# Patient Record
Sex: Male | Born: 1955
Health system: Southern US, Community
[De-identification: ages and names within clinical notes are randomized; demographics above are authoritative.]

## PROBLEM LIST (undated history)

## (undated) DIAGNOSIS — E785 Hyperlipidemia, unspecified: Secondary | ICD-10-CM

## (undated) DIAGNOSIS — F410 Panic disorder [episodic paroxysmal anxiety] without agoraphobia: Secondary | ICD-10-CM

## (undated) DIAGNOSIS — G4700 Insomnia, unspecified: Secondary | ICD-10-CM

## (undated) DIAGNOSIS — F419 Anxiety disorder, unspecified: Secondary | ICD-10-CM

## (undated) DIAGNOSIS — K649 Unspecified hemorrhoids: Secondary | ICD-10-CM

## (undated) DIAGNOSIS — Z7722 Contact with and (suspected) exposure to environmental tobacco smoke (acute) (chronic): Secondary | ICD-10-CM

## (undated) DIAGNOSIS — IMO0001 Reserved for inherently not codable concepts without codable children: Secondary | ICD-10-CM

## (undated) DIAGNOSIS — K5730 Diverticulosis of large intestine without perforation or abscess without bleeding: Secondary | ICD-10-CM

## (undated) DIAGNOSIS — F0280 Dementia in other diseases classified elsewhere without behavioral disturbance: Secondary | ICD-10-CM

## (undated) HISTORY — DX: Hyperlipidemia, unspecified: E78.5

## (undated) HISTORY — DX: Panic disorder (episodic paroxysmal anxiety): F41.0

## (undated) HISTORY — DX: Diverticulosis of large intestine without perforation or abscess without bleeding: K57.30

## (undated) HISTORY — PX: SKIN BIOPSY: SHX1

## (undated) HISTORY — DX: Anxiety disorder, unspecified: F41.9

## (undated) HISTORY — DX: Reserved for inherently not codable concepts without codable children: IMO0001

## (undated) HISTORY — DX: Dementia in other diseases classified elsewhere, unspecified severity, without behavioral disturbance, psychotic disturbance, mood disturbance, and anxiety: F02.80

## (undated) HISTORY — PX: WISDOM TOOTH EXTRACTION: SHX21

## (undated) HISTORY — DX: Insomnia, unspecified: G47.00

## (undated) HISTORY — DX: Unspecified hemorrhoids: K64.9

## (undated) HISTORY — DX: Contact with and (suspected) exposure to environmental tobacco smoke (acute) (chronic): Z77.22

## (undated) HISTORY — PX: ANAL FISSURE REPAIR: SHX2312

---

## 2000-02-10 ENCOUNTER — Ambulatory Visit (HOSPITAL_COMMUNITY): Admission: RE | Admit: 2000-02-10 | Discharge: 2000-02-10 | Payer: Self-pay | Admitting: Surgery

## 2001-09-14 ENCOUNTER — Encounter: Admission: RE | Admit: 2001-09-14 | Discharge: 2001-10-16 | Payer: Self-pay | Admitting: Internal Medicine

## 2006-07-19 ENCOUNTER — Ambulatory Visit: Payer: Self-pay | Admitting: Family Medicine

## 2007-08-02 ENCOUNTER — Ambulatory Visit: Payer: Self-pay | Admitting: Family Medicine

## 2010-11-03 ENCOUNTER — Encounter (INDEPENDENT_AMBULATORY_CARE_PROVIDER_SITE_OTHER): Payer: Self-pay | Admitting: Family Medicine

## 2010-11-03 DIAGNOSIS — F3289 Other specified depressive episodes: Secondary | ICD-10-CM

## 2010-11-03 DIAGNOSIS — E785 Hyperlipidemia, unspecified: Secondary | ICD-10-CM

## 2010-11-03 DIAGNOSIS — F329 Major depressive disorder, single episode, unspecified: Secondary | ICD-10-CM

## 2010-11-03 DIAGNOSIS — F411 Generalized anxiety disorder: Secondary | ICD-10-CM

## 2011-05-09 ENCOUNTER — Telehealth: Payer: Self-pay | Admitting: Family Medicine

## 2011-05-09 MED ORDER — SIMVASTATIN 20 MG PO TABS: 20.0000 mg | ORAL_TABLET | Freq: Every evening | ORAL | Status: DC

## 2011-05-09 NOTE — Telephone Encounter (Signed)
Needs refill simvastatin. Has meds ck appt on 8/22 but will run out before then.   [facility]

## 2011-05-16 ENCOUNTER — Encounter: Payer: Self-pay | Admitting: Family Medicine

## 2011-05-18 ENCOUNTER — Other Ambulatory Visit: Payer: Self-pay | Admitting: Family Medicine

## 2011-05-18 ENCOUNTER — Other Ambulatory Visit: Payer: Self-pay

## 2011-05-18 ENCOUNTER — Encounter: Payer: Self-pay | Admitting: Family Medicine

## 2011-05-18 ENCOUNTER — Ambulatory Visit (INDEPENDENT_AMBULATORY_CARE_PROVIDER_SITE_OTHER): Payer: Self-pay | Admitting: Family Medicine

## 2011-05-18 VITALS — BP 116/70 | HR 51 | Wt 183.0 lb

## 2011-05-18 DIAGNOSIS — E785 Hyperlipidemia, unspecified: Secondary | ICD-10-CM | POA: Insufficient documentation

## 2011-05-18 DIAGNOSIS — F32A Depression, unspecified: Secondary | ICD-10-CM

## 2011-05-18 DIAGNOSIS — F3289 Other specified depressive episodes: Secondary | ICD-10-CM

## 2011-05-18 DIAGNOSIS — F329 Major depressive disorder, single episode, unspecified: Secondary | ICD-10-CM

## 2011-05-18 MED ORDER — SIMVASTATIN 10 MG PO TABS: 10.0000 mg | ORAL_TABLET | Freq: Every day | ORAL | Status: DC

## 2011-05-18 NOTE — Progress Notes (Signed)
  Subjective:    Patient ID: Paul Klein, male    DOB: [birth year], 55 y.o.   MRN: [id number]  HPI He is here for a recheck. He is followed by Dr. Evelene Croon for his depression. He also is on simvastatin as well as aspirin, vitamin D and fish oil. He has had difficulty finding a job and this is quite stressful. He has no other concerns or complaints.   Review of Systems     Objective:   Physical Exam  Alert and in no distress otherwise not examined      Assessment & Plan:  Hyperlipidemia. Depression. Routine blood screening and continue on present medications.

## 2011-05-19 LAB — CLIENT PROFILE 1158
ALT: 29 U/L (ref 0–53)
Albumin: 4.7 g/dL (ref 3.5–5.2)
CO2: 27 mEq/L (ref 19–32)
Calcium: 9.8 mg/dL (ref 8.4–10.5)
Chloride: 103 mEq/L (ref 96–112)
Cholesterol: 157 mg/dL (ref 0–200)
Eosinophils Absolute: 0 10*3/uL (ref 0.0–0.7)
Free Thyroxine Index: 3.1 (ref 1.0–3.9)
Glucose, Bld: 107 mg/dL — ABNORMAL HIGH (ref 70–99)
Hemoglobin: 15.1 g/dL (ref 13.0–17.0)
Lymphocytes Relative: 30 % (ref 12–46)
Lymphs Abs: 1.8 10*3/uL (ref 0.7–4.0)
MCH: 29.2 pg (ref 26.0–34.0)
Monocytes Relative: 6 % (ref 3–12)
Neutrophils Relative %: 63 % (ref 43–77)
Potassium: 4.8 mEq/L (ref 3.5–5.3)
RBC: 5.17 MIL/uL (ref 4.22–5.81)
Sodium: 138 mEq/L (ref 135–145)
T3 Uptake: 29.8 % (ref 22.5–37.0)
TSH: 1.123 u[IU]/mL (ref 0.350–4.500)
Total Bilirubin: 0.7 mg/dL (ref 0.3–1.2)
Total Protein: 6.9 g/dL (ref 6.0–8.3)
Triglycerides: 115 mg/dL (ref <150)
WBC: 5.8 10*3/uL (ref 4.0–10.5)

## 2011-05-26 ENCOUNTER — Telehealth: Payer: Self-pay | Admitting: Family Medicine

## 2011-05-26 NOTE — Telephone Encounter (Signed)
Please call pt never heard from labs from February visit

## 2011-05-26 NOTE — Telephone Encounter (Signed)
Correction, pt calling for lab results from 05/18/11

## 2011-08-22 ENCOUNTER — Encounter: Payer: Self-pay | Admitting: Family Medicine

## 2011-12-13 ENCOUNTER — Other Ambulatory Visit: Payer: Self-pay | Admitting: Family Medicine

## 2012-01-11 ENCOUNTER — Other Ambulatory Visit: Payer: Self-pay | Admitting: Family Medicine

## 2012-02-08 ENCOUNTER — Other Ambulatory Visit: Payer: Self-pay | Admitting: Family Medicine

## 2012-12-25 ENCOUNTER — Ambulatory Visit (INDEPENDENT_AMBULATORY_CARE_PROVIDER_SITE_OTHER): Payer: BC Managed Care – PPO | Admitting: Medical

## 2012-12-25 ENCOUNTER — Encounter: Payer: Self-pay | Admitting: Medical

## 2012-12-25 VITALS — BP 110/70 | HR 52 | Temp 97.7°F | Resp 16 | Ht 72.0 in | Wt 170.0 lb

## 2012-12-25 DIAGNOSIS — E785 Hyperlipidemia, unspecified: Secondary | ICD-10-CM

## 2012-12-25 DIAGNOSIS — IMO0001 Reserved for inherently not codable concepts without codable children: Secondary | ICD-10-CM

## 2012-12-25 DIAGNOSIS — Z1211 Encounter for screening for malignant neoplasm of colon: Secondary | ICD-10-CM

## 2012-12-25 DIAGNOSIS — Z0000 Encounter for general adult medical examination without abnormal findings: Secondary | ICD-10-CM

## 2012-12-25 DIAGNOSIS — R9431 Abnormal electrocardiogram [ECG] [EKG]: Secondary | ICD-10-CM

## 2012-12-25 DIAGNOSIS — I498 Other specified cardiac arrhythmias: Secondary | ICD-10-CM

## 2012-12-25 DIAGNOSIS — R7301 Impaired fasting glucose: Secondary | ICD-10-CM

## 2012-12-25 DIAGNOSIS — R001 Bradycardia, unspecified: Secondary | ICD-10-CM

## 2012-12-25 DIAGNOSIS — Q849 Congenital malformation of integument, unspecified: Secondary | ICD-10-CM

## 2012-12-25 DIAGNOSIS — Q829 Congenital malformation of skin, unspecified: Secondary | ICD-10-CM

## 2012-12-25 HISTORY — DX: Reserved for inherently not codable concepts without codable children: IMO0001

## 2012-12-25 LAB — CBC WITH DIFFERENTIAL/PLATELET
Eosinophils Absolute: 0.1 10*3/uL (ref 0.0–0.7)
Eosinophils Relative: 2 % (ref 0–5)
HCT: 39.1 % (ref 39.0–52.0)
Hemoglobin: 13.2 g/dL (ref 13.0–17.0)
Lymphs Abs: 1.5 10*3/uL (ref 0.7–4.0)
MCH: 28.1 pg (ref 26.0–34.0)
MCV: 83.4 fL (ref 78.0–100.0)
Monocytes Absolute: 0.5 10*3/uL (ref 0.1–1.0)
Monocytes Relative: 10 % (ref 3–12)
Platelets: 183 10*3/uL (ref 150–400)
RBC: 4.69 MIL/uL (ref 4.22–5.81)

## 2012-12-25 LAB — LIPID PANEL
LDL Cholesterol: 70 mg/dL (ref 0–99)
Total CHOL/HDL Ratio: 2.9 Ratio
VLDL: 18 mg/dL (ref 0–40)

## 2012-12-25 LAB — POCT URINALYSIS DIPSTICK
Protein, UA: NEGATIVE
Spec Grav, UA: <=1.005
Urobilinogen, UA: NEGATIVE

## 2012-12-25 LAB — COMPREHENSIVE METABOLIC PANEL
Alkaline Phosphatase: 55 U/L (ref 39–117)
Glucose, Bld: 103 mg/dL — ABNORMAL HIGH (ref 70–99)
Sodium: 138 mEq/L (ref 135–145)
Total Bilirubin: 0.5 mg/dL (ref 0.3–1.2)
Total Protein: 6.5 g/dL (ref 6.0–8.3)

## 2012-12-25 NOTE — Patient Instructions (Signed)
Recommendations today:  Referral to Dr. Jorja Loa for skin surveillance and check on the left temple and right upper quadrant abdominal lesions  Referral for 1st screening colonoscopy consult  We will call back with possible consideration of cardiology referral for baseline cardiac testing  Consider pneumococcal vaccine given your age and tobacco smoke exposure  Eat a healthy low fat diet, try and get some exercise daily such as walking  See dentist 2 times per year for hygiene  See eye doctor yearly for routine screening   Get Influenza vaccine yearly in October  Continue your medications as usual  We will call with lab results

## 2012-12-25 NOTE — Progress Notes (Addendum)
Subjective:   HPI  Paul Klein is a 57 y.o. male who presents for a complete physical.  Doing well with no specific c/o.   Preventative care: Last ophthalmology visit:n/a Last dental visit:yes-Dr. Hail Last colonoscopy:never Last prostate exam:  Last KGM:WNUUVO Last labs: 2012  Prior vaccinations: TD or Tdap:maybe 7 years ago Influenza:07/2012 Pneumococcal:n/a Shingles/Zostavax:n/A  Advanced directive:n/a Health care power of attorney:n/a Living will:n/a  Concerns: None specifically.  Compliant with medication for cholesterol.  Reviewed their medical, surgical, family, social, medication, and allergy history and updated chart as appropriate.   Past Medical History  Diagnosis Date  . Dyslipidemia   . Anxiety     Dr. Evelene Croon  . Panic attack   . Insomnia     uses Neurontin ( Dr. Evelene Croon)  . Second hand smoke exposure     wife    Past Surgical History  Procedure Laterality Date  . Anal fissure repair    . Wisdom tooth extraction    . Skin biopsy      face, back  . Colonoscopy      never as of 12/2012    Family History  Problem Relation Age of Onset  . Heart disease Mother     BYPASS HEART SURGERY  . Depression Mother   . Mental illness Mother   . Cancer Mother     skin  . Stroke Maternal Grandmother   . Hypertension Father   . Benign prostatic hyperplasia Father   . Cancer Father     skin  . Diabetes Paternal Grandfather     History   Social History  . Marital Status: Married    Spouse Name: N/A    Number of Children: N/A  . Years of Education: N/A   Occupational History  . Not on file.   Social History Main Topics  . Smoking status: Passive Smoke Exposure - Never Smoker  . Smokeless tobacco: Never Used  . Alcohol Use: No  . Drug Use: No     Comment: marijuana in the past  . Sexually Active: Not on file   Other Topics Concern  . 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 aspirin 81 MG tablet Take 81 mg by mouth daily.        . cholecalciferol (VITAMIN D) 1000 UNITS tablet Take 1,000 Units by mouth daily.        . fish oil-omega-3 fatty acids 1000 MG capsule Take 2 g by mouth daily.        [name] gabapentin (NEURONTIN) 800 MG tablet Take 800 mg by mouth 3 (three) times daily.        [name] LORazepam (ATIVAN) 2 MG tablet Take 4 mg by mouth daily.        . simvastatin (ZOCOR) 10 MG tablet TAKE 1 TABLET BY MOUTH DAILY AT BEDTIME  30 tablet  2   No current facility-administered medications on file prior to visit.    Allergies  Allergen Reactions  . Prozac (Fluoxetine Hcl)     SSRIs in general cause worsened depression and suicidal ideation     Review of Systems Constitutional: -fever, -chills, -sweats, -unexpected weight change, -decreased appetite, -fatigue Allergy: -sneezing, -itching, -congestion Dermatology: -changing moles, --rash, -lumps ENT: -runny nose, -ear pain, -sore throat, -hoarseness, -sinus pain, -teeth pain, - ringing in ears, -  hearing loss, -nosebleeds Cardiology: -chest pain, -palpitations, -swelling, -difficulty breathing when lying flat, -waking up short of breath Respiratory: +cough, -shortness of breath, -difficulty breathing with exercise or exertion, -wheezing, -coughing up blood Gastroenterology: -abdominal pain, -nausea, -vomiting, -diarrhea, -constipation, -blood in stool, -changes in bowel movement, -difficulty swallowing or eating Hematology: +bleeding, -bruising  Musculoskeletal: +joint aches, -muscle aches, -joint swelling, +back pain, +neck pain, -cramping, -changes in gait Ophthalmology: denies vision changes, eye redness, itching, discharge Urology: -burning with urination, -difficulty urinating, -blood in urine, -urinary frequency, -urgency, -incontinence Neurology: -headache, -weakness, +tingling, -numbness, -memory loss, -falls,  -dizziness Psychology: -depressed mood, -agitation, -sleep problems     Objective:   Physical Exam  Vitals and nurse notes reviewed  General appearance: alert, no distress, WD/WN, lean white male Skin:  RUQ of abdomen with 3mm roundish lesion, 2 different shades of brown, somewhat ill defined border, left temple with 5mm squarish pink/red, broad lesion with slight raised appearance, rough texture, several seborrheic keratoses on back and flanks, other small brown round benign appearing lesions throughout HEENT: normocephalic, conjunctiva/corneas normal, sclerae anicteric, PERRLA, EOMi, nares patent, no discharge or erythema, pharynx normal Oral cavity: MMM, tongue normal, teeth in good repair Neck: supple, no lymphadenopathy, no thyromegaly, no masses, normal ROM, no bruits Chest: non tender, normal shape and expansion Heart: RRR, normal S1, S2, no murmurs Lungs: CTA bilaterally, no wheezes, rhonchi, or rales Abdomen: +bs, soft, non tender, non distended, no masses, no hepatomegaly, no splenomegaly, no bruits Back: non tender, normal ROM, no scoliosis Musculoskeletal: upper extremities non tender, no obvious deformity, normal ROM throughout, lower extremities non tender, no obvious deformity, normal ROM throughout Extremities: no edema, no cyanosis, no clubbing Pulses: 2+ symmetric, upper and lower extremities, normal cap refill Neurological: alert, oriented x 3, CN2-12 intact, strength normal upper extremities and lower extremities, sensation normal throughout, DTRs 2+ throughout, no cerebellar signs, gait normal Psychiatric: normal affect, behavior normal, pleasant  GU: normal male external genitalia, circumcised, small reducible inguinal hernia on the right, nontender, no masses, no lymphadenopathy Rectal: anus normal appearing, normal tone, prostate mildly enlarged, no nodules, occult negative stool   Adult ECG Report  Indication: physical  Rate: 48 bpm  Rhythm: sinus bradycardia   QRS Axis: 82 degrees  PR Interval: 162 ms  QRS Duration: 94ms  QTc:  Conduction Disturbances: none  Other Abnormalities: early repolarization vs questionable ST elevation II, III, AVF  Patient's cardiac risk factors are: advanced age (older than 38 for men, 66 for women), dyslipidemia, male gender and smoking/ tobacco exposure.  EKG comparison: none  Narrative Interpretation: sinus bradycardia, early repolarization     Assessment and Plan :    Encounter Diagnoses  Name Primary?  . Routine general medical examination at a health care facility Yes  . Dyslipidemia   . Special screening for malignant neoplasms, colon   . Nonspecific abnormal electrocardiogram (ECG) (EKG)   . Bradycardia   . Skin anomaly   . Impaired fasting glucose     Physical exam - discussed healthy lifestyle, diet, exercise, preventative care, vaccinations, and addressed their concerns.  Advised yearly eye doctor visit, twice yearly dental visits, yearly physical here, increased exercise, healthy diet.     Dyslipidemia - c/t same medications, labs today  Referral for 1st colonoscopy  Abnormal EKG, bradycardia on screening physical - pending labs, EKG review with Dr. Susann Givens, consider referral to cardiology for baseline screening.  He does have significant secondhand smoke exposure  Skin anaomly - referral to Dr. Jorja Loa for  routine surveillance and the 2 abnormal skin lesions (left temple, RUQ abdomen)  Impaired fasting glucose - labs today  Follow-up pending labs, referrals

## 2012-12-26 ENCOUNTER — Other Ambulatory Visit: Payer: Self-pay | Admitting: Medical

## 2012-12-26 LAB — VITAMIN D 25 HYDROXY (VIT D DEFICIENCY, FRACTURES): Vit D, 25-Hydroxy: 39 ng/mL (ref 30–89)

## 2012-12-26 LAB — HIGH SENSITIVITY CRP: CRP, High Sensitivity: 15.9 mg/L — ABNORMAL HIGH

## 2012-12-26 LAB — HEMOGLOBIN A1C: Mean Plasma Glucose: 128 mg/dL — ABNORMAL HIGH (ref <117)

## 2012-12-26 MED ORDER — SIMVASTATIN 10 MG PO TABS: 10.0000 mg | ORAL_TABLET | Freq: Every day | ORAL | Status: DC

## 2013-01-01 ENCOUNTER — Telehealth: Payer: Self-pay | Admitting: Family Medicine

## 2013-01-01 NOTE — Telephone Encounter (Signed)
Patient is aware of his appointment at Dr. Elnoria Howard office on 12/31/12 @ 915 am. CLS

## 2013-01-03 ENCOUNTER — Encounter: Payer: Self-pay | Admitting: Cardiology

## 2013-01-03 NOTE — Progress Notes (Signed)
Patient ID: Paul Klein, male   DOB: 1956-09-25, 57 y.o.   MRN: [id number] Paul Klein, Paul Klein    Date of visit:  01/03/2013 DOB:  15-Oct-1955    Age:  56 yrs. Medical record number:  [id number] Account number:  [id number] Primary Care Provider: Sharlot Gowda C ____________________________ CURRENT DIAGNOSES  1. Hyperlipidemia  2. Essential hypertension  3. Abnormal EKG ____________________________ ALLERGIES  Prozac ____________________________ MEDICATIONS  1. aspirin 81 mg tablet,chewable, 1 p.o. daily  2. Vitamin D3 1,000 unit tablet, 1 p.o. daily  3. simvastatin 10 mg tablet, 1 p.o. daily  4. Fish Oil 1,000 mg capsule, 2 qd  5. gabapentin 800 mg tablet, TID  6. lorazepam 1 mg tablet, 1/2 bid  1 hs  7. multivitamin tablet, 1 p.o. daily  8. Zyrtec 10 mg tablet, 1 p.o. daily ____________________________ CHIEF COMPLAINTS  Told  pulse irrg ____________________________ HISTORY OF PRESENT ILLNESS  Patient seen for cardiac consultation for abnormal EKG. The patient has previously been in good health except for history of some anxiety and depression for which he sees a psychiatrist. He has been on medicine for lipid lowering for several years. He was seen recently for a physical examination and was noted to have an elevated CRP as well as significant sinus bradycardia. 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 He denies angina and has no PND, orthopnea, syncope, palpitations, or claudication. ____________________________ PAST HISTORY  Past Medical Illnesses:  hypertension, anxiety;  Cardiovascular Illnesses:  no previous history of cardiac disease.;  Surgical Procedures:  anal fissure;  Cardiology Procedures-Invasive:  no history of prior cardiac procedures;  Cardiology Procedures-Noninvasive:  no previous non-invasive procedures;  LVEF not documented,   ____________________________ CARDIO-PULMONARY TEST DATES EKG Date:  01/03/2013;    ____________________________ FAMILY HISTORY Father - age 57,  alive and well and  hypertension; Mother - CABG and mental illness; Brother 1 - age 16,  alive and well; Sister 1 - age 90,  alive and well; Sister 2 - age 55,  alive and well;  ____________________________ SOCIAL HISTORY Alcohol Use:  no alcohol use;  Smoking:  used to smoke but quit Prior to 1980;  Diet:  regular diet;  Lifestyle:  divorced and remarried;  Exercise:  no regular exercise;  Occupation:  produce Scientist, physiological and former Education administrator;  Residence:  lives with wife;   ____________________________ REVIEW OF SYSTEMS General:  weight loss of approximately 15 lbs  Integumentary:no rashes or new skin lesions. Eyes: denies diplopia, history of glaucoma or visual problems. Ears, Nose, Throat, Mouth:  denies any hearing loss, epistaxis, hoarseness or difficulty speaking. Respiratory: denies dyspnea, cough, wheezing or hemoptysis. Cardiovascular:  please review HPI Abdominal: denies dyspepsia, GI bleeding, constipation, or diarrhea Genitourinary-Male: no dysuria, urgency, frequency, or nocturia  Musculoskeletal:  recent foot injury Neurological:  denies headaches, stroke, or TIA Psychiatric:  chronic depression, chronic anxiety, insomnia  ____________________________ PHYSICAL EXAMINATION VITAL SIGNS  Blood Pressure:  104/60 Sitting, Left arm, regular cuff  , 88/58 Standing, Left arm and regular cuff   Pulse:  60/min. Weight:  168.00 lbs. Height:  72"BMI: 23  Constitutional:  pleasant white male in no acute distress Skin:  warm and dry to touch, no apparent skin lesions, or masses noted. Head:  normocephalic, balding male hair pattern Eyes:  EOMS Intact, PERRLA, C and S clear, Funduscopic exam not done. ENT:  ears, nose and throat reveal no gross abnormalities.  Dentition good. Neck:  supple, without  massess. No JVD, thyromegaly or carotid bruits. Carotid upstroke normal. Chest:  normal symmetry, clear to auscultation and  percussion. Cardiac:  regular rhythm, normal S1 and S2, No S3 or S4, no murmurs, gallops or rubs detected. Abdomen:  abdomen soft,non-tender, no masses, no hepatospenomegaly, or aneurysm noted Peripheral Pulses:  the femoral,dorsalis pedis, and posterior tibial pulses are full and equal bilaterally with no bruits auscultated. Extremities & Back:  no deformities, clubbing, cyanosis, erythema or edema observed. Normal muscle strength and tone. Neurological:  no gross motor or sensory deficits noted, affect appropriate, oriented x3. ____________________________ MOST RECENT LIPID PANEL 12/26/12  CHOL TOTL 135 mg/dl, LDL 70 calc, HDL 47 mg/dl, TRIGLYCER 92 mg/dl and CHOL/HDL 2.9 (Calc) ____________________________ IMPRESSIONS/PLAN  1. Sinus bradycardia with otherwise normal EKG 2. Elevated CRP with several cardiovascular risk factors  Recommendations:  Obtain treadmill test to evaluate exercise capacity. ____________________________ Christianne Dolin  1. treadmill:  Regular TM At [facility]  2. 12 Lead EKG: Today                       ____________________________ Cardiology Physician:  Darden Palmer MD [facility]

## 2013-02-19 ENCOUNTER — Encounter: Payer: Self-pay | Admitting: Internal Medicine

## 2013-02-19 HISTORY — PX: COLONOSCOPY: SHX174

## 2013-02-19 LAB — HM COLONOSCOPY

## 2013-08-01 ENCOUNTER — Other Ambulatory Visit: Payer: Self-pay

## 2014-02-06 ENCOUNTER — Other Ambulatory Visit: Payer: Self-pay | Admitting: Medical

## 2014-02-06 NOTE — Telephone Encounter (Signed)
Needs office visit.

## 2014-04-03 ENCOUNTER — Ambulatory Visit (INDEPENDENT_AMBULATORY_CARE_PROVIDER_SITE_OTHER): Payer: BC Managed Care – PPO | Admitting: Medical

## 2014-04-03 ENCOUNTER — Encounter: Payer: Self-pay | Admitting: Medical

## 2014-04-03 VITALS — BP 114/70 | HR 60 | Temp 97.9°F | Resp 17 | Wt 162.0 lb

## 2014-04-03 DIAGNOSIS — L0501 Pilonidal cyst with abscess: Secondary | ICD-10-CM

## 2014-04-03 MED ORDER — DOXYCYCLINE HYCLATE 100 MG PO TABS: 100.0000 mg | ORAL_TABLET | Freq: Two times a day (BID) | ORAL | Status: DC

## 2014-04-03 MED ORDER — HYDROCODONE-ACETAMINOPHEN 7.5-325 MG PO TABS: 1.0000 | ORAL_TABLET | Freq: Four times a day (QID) | ORAL | Status: DC | PRN

## 2014-04-03 NOTE — Patient Instructions (Signed)
  Thank you for giving me the opportunity to serve you today.    Your diagnosis today includes: Encounter Diagnosis  Name Primary?  . Pilonidal abscess Yes     Specific recommendations today include:  Begin Doxycyline antibiotic twice daily   You may use the Hydrocodone pain medication as needed, 1 tablet every 4-6 hours  Over the weekend use Epsom salt hot water bath soaks  Keep gauze dressing over the area as it will drain some pus  Recheck here Monday  Pilonidal Cyst A pilonidal cyst occurs when hairs get trapped (ingrown) beneath the skin in the crease between the buttocks over your sacrum (the bone under that crease). Pilonidal cysts are most common in young men with a lot of body hair. When the cyst is ruptured (breaks) or leaking, fluid from the cyst may cause burning and itching. If the cyst becomes infected, it causes a painful swelling filled with pus (abscess). The pus and trapped hairs need to be removed (often by lancing) so that the infection can heal. However, recurrence is common and an operation may be needed to remove the cyst. HOME CARE INSTRUCTIONS   If the cyst was NOT INFECTED:  Keep the area clean and dry. Bathe or shower daily. Wash the area well with a germ-killing soap. Warm tub baths may help prevent infection and help with drainage. Dry the area well with a towel.  Avoid tight clothing to keep area as moisture free as possible.  Keep area between buttocks as free of hair as possible. A depilatory may be used.  If the cyst WAS INFECTED and needed to be drained:  Your caregiver packed the wound with gauze to keep the wound open. This allows the wound to heal from the inside outwards and continue draining.  Return for a wound check in 1 day or as suggested.  If you take tub baths or showers, repack the wound with gauze following them. Sponge baths (at the sink) are a good alternative.  If an antibiotic was ordered to fight the infection, take as  directed.  Only take over-the-counter or prescription medicines for pain, discomfort, or fever as directed by your caregiver.  After the drain is removed, use sitz baths for 20 minutes 4 times per day. Clean the wound gently with mild unscented soap, pat dry, and then apply a dry dressing. SEEK MEDICAL CARE IF:   You have increased pain, swelling, redness, drainage, or bleeding from the area.  You have a fever.  You have muscles aches, dizziness, or a general ill feeling. Document Released: 09/09/2000 Document Revised: 12/05/2011 Document Reviewed: 11/07/2008 [facility] Patient Information 2015 [place], [place]. This information is not intended to replace advice given to you by your health care provider. Make sure you discuss any questions you have with your health care provider.

## 2014-04-03 NOTE — Addendum Note (Signed)
Addended by: Carlena Hurl on: 04/03/2014 03:44 PM   Modules accepted: Orders

## 2014-04-03 NOTE — Progress Notes (Signed)
   Subjective:    Patient ID: Paul Klein, male    DOB: [birth year], 58 y.o.   MRN: [id number]  HPI  Patient is presenting for a 5 day history of a growth over left buttock midline/top of butt crack. Patient states that he first noticed it on Saturday when he sat down, it felt painful. He reports that the growth is red and hurts to sit. He did notice on Sunday some blood on the lower side of his shirt where the growth is. He has tried soaking in a tub with warm water would help but did not notice a difference afterward.  Denies fevers, n/v, diarrhea, constipation, frequent skin infections, urinary changes. He has had a sebaceous cyst removed from his back before with PA Maudie Mercury here at [place], but no prior pilonidal abscess, no prior other skin abscess, no prior MRSA.   No other aggravating or relieving factors. No other complaint  Review of Systems As in subjective.    Objective:   Physical Exam  BP 114/70  Pulse 60  Temp(Src) 97.9 F (36.6 C) (Oral)  Resp 17  Wt 162 lb (73.483 kg)  General: alert and cooperative, wd/wn, no acute distress Skin: Left buttock adjacent to the top of the gluteal cleft with area of induration and erythema ~3cm in diameter. Lesion is tender but without obvious fluctuance or drainage.      Assessment & Plan:   Encounter Diagnosis  Name Primary?  . Pilonidal abscess Yes   We discussed the findings, discussed the likelihood of an abscess, but not clear if this is an extensive Pilonidal tracking formation.  Doesn't appear so initially.  We discussed treatment options and he agrees to I and D.  Discussed risks/benefits of procedure, and patient gave consent for I&D of the left upper buttock at the top of the gluteal cleft.  Prepped skin in usual sterile fashion.  Used 2.5 cc of 1% lidocaine with epinephrine for local anesthesia, used #11 blade to incise the indurated area, expressed  3 cc of purulent material.  Explored when the hemostats and the  area appears to be a small pocket.  Irrigated area with high pressure saline.  Didn't place packing given the size of the wound and given the fact that today is Thursday and will not be able to remove the packing in 3 day.  Covered wound with sterile bandage and dressing.  EBL 1cc.  Patient tolerated procedure well.    Begin doxycycline antibiotic, hydrocodone as needed for pain, advised Epsom salt warm bath soaks for 20 minutes at a time, numerous times over the weekend. Recheck here on Monday

## 2014-04-06 LAB — WOUND CULTURE
GRAM STAIN: NONE SEEN
GRAM STAIN: NONE SEEN
ORGANISM ID, BACTERIA: NO GROWTH

## 2014-04-07 ENCOUNTER — Ambulatory Visit (INDEPENDENT_AMBULATORY_CARE_PROVIDER_SITE_OTHER): Payer: BC Managed Care – PPO | Admitting: Medical

## 2014-04-07 ENCOUNTER — Encounter: Payer: Self-pay | Admitting: Medical

## 2014-04-07 VITALS — BP 112/62 | HR 80 | Resp 14 | Wt 165.0 lb

## 2014-04-07 DIAGNOSIS — L0501 Pilonidal cyst with abscess: Secondary | ICD-10-CM

## 2014-04-07 NOTE — Progress Notes (Signed)
   Subjective:    Patient ID: Paul Klein, male    DOB: Jun 05, 1956, 58 y.o.   MRN: [id number]  HPI  Patient is presenting for recheck on pilonidal abscess.   We saw him here Friday, had I&D, taking antibiotic without fever or other symptoms.   Here for wound check,  Improved.    Review of Systems As in subjective.    Objective:   Physical Exam  BP 112/62  Pulse 80  Resp 14  Wt 165 lb (74.844 kg)  General: alert and cooperative, wd/wn, no acute distress Skin: Left buttock adjacent to the top of the gluteal cleft with area of pink/red coloration ~ 1 cm diameter now, slightly tender, no current drainage     Assessment & Plan:   Encounter Diagnosis  Name Primary?  . Pilonidal abscess Yes   Much improved, finish antibiotic, c/t warm compresses.  If not back to normal within a week, let me know.

## 2014-07-11 ENCOUNTER — Other Ambulatory Visit: Payer: Self-pay | Admitting: Medical

## 2014-07-11 ENCOUNTER — Telehealth: Payer: Self-pay | Admitting: *Deleted

## 2014-07-11 NOTE — Telephone Encounter (Signed)
Called patient and let him know I could only give him #30 on his zocor this one time. His last CPE and lipid panel were 12/2012. Asked him to please call back to schedule either fasting med check or fasting CPE in the next 30 days.

## 2014-07-31 ENCOUNTER — Ambulatory Visit (INDEPENDENT_AMBULATORY_CARE_PROVIDER_SITE_OTHER): Payer: BC Managed Care – PPO | Admitting: Medical

## 2014-07-31 ENCOUNTER — Encounter: Payer: Self-pay | Admitting: Medical

## 2014-07-31 VITALS — BP 106/60 | HR 58 | Temp 97.9°F | Resp 15 | Ht 72.0 in | Wt 163.0 lb

## 2014-07-31 DIAGNOSIS — Z808 Family history of malignant neoplasm of other organs or systems: Secondary | ICD-10-CM

## 2014-07-31 DIAGNOSIS — D229 Melanocytic nevi, unspecified: Secondary | ICD-10-CM

## 2014-07-31 DIAGNOSIS — Z125 Encounter for screening for malignant neoplasm of prostate: Secondary | ICD-10-CM

## 2014-07-31 DIAGNOSIS — Z7185 Encounter for immunization safety counseling: Secondary | ICD-10-CM

## 2014-07-31 DIAGNOSIS — Z135 Encounter for screening for eye and ear disorders: Secondary | ICD-10-CM

## 2014-07-31 DIAGNOSIS — Z7189 Other specified counseling: Secondary | ICD-10-CM

## 2014-07-31 DIAGNOSIS — R7301 Impaired fasting glucose: Secondary | ICD-10-CM

## 2014-07-31 DIAGNOSIS — R7982 Elevated C-reactive protein (CRP): Secondary | ICD-10-CM

## 2014-07-31 DIAGNOSIS — Z7722 Contact with and (suspected) exposure to environmental tobacco smoke (acute) (chronic): Secondary | ICD-10-CM

## 2014-07-31 DIAGNOSIS — Z0000 Encounter for general adult medical examination without abnormal findings: Secondary | ICD-10-CM

## 2014-07-31 DIAGNOSIS — Z23 Encounter for immunization: Secondary | ICD-10-CM

## 2014-07-31 DIAGNOSIS — E785 Hyperlipidemia, unspecified: Secondary | ICD-10-CM

## 2014-07-31 LAB — POCT URINALYSIS DIPSTICK
Bilirubin, UA: NEGATIVE
Blood, UA: NEGATIVE
Glucose, UA: NEGATIVE
Ketones, UA: NEGATIVE
Leukocytes, UA: NEGATIVE
NITRITE UA: NEGATIVE
PH UA: 7
PROTEIN UA: NEGATIVE
Spec Grav, UA: 1.01
Urobilinogen, UA: NEGATIVE

## 2014-07-31 NOTE — Progress Notes (Signed)
Subjective:   HPI  Paul Klein is a 58 y.o. male who presents for a complete physical.   Preventative care: Last ophthalmology visit:20 years ago  Last dental visit:3 years  Last colonoscopy:2 years  Last prostate exam: yearly Last EKG:2 years ago  Last labs:yearly  Prior vaccinations: TD or Tdap:unsure Influenza:today Pneumococcal:no Shingles/Zostavax:no  Advanced directive:no Health care power of attorney:no Living will:yes  Concerns: none  Dyslipidemia - compliant with medication without c/o.  Reviewed their medical, surgical, family, social, medication, and allergy history and updated chart as appropriate.  Past Medical History  Diagnosis Date  . Dyslipidemia   . Anxiety     Dr. Toy Care  . Panic attack   . Insomnia     uses Neurontin ( Dr. Toy Care)  . Second hand smoke exposure     wife  . Hemorrhoids     internal and externa per 2014 colonoscopy  . Diverticulosis of colon     sigmoid, per colonoscopy 2014, Dr. Benson Norway  . Normal cardiac stress test 12/2012    performed due to risk factors, abnormal EKG; Dr. Wynonia Lawman    Past Surgical History  Procedure Laterality Date  . Anal fissure repair    . Wisdom tooth extraction    . Skin biopsy      face, back  . Colonoscopy  02/19/13    diverticulosis of sigmoid colon, external and internal hemorrhoids, Dr. Benson Norway, repeat in 10 years    History   Social History  . Marital Status: Married    Spouse Name: N/A    Number of Children: N/A  . Years of Education: N/A   Occupational History  . Not on file.   Social History Main Topics  . Smoking status: Passive Smoke Exposure - Never Smoker  . Smokeless tobacco: Never Used  . Alcohol Use: No  . Drug Use: No     Comment: marijuana in the past  . Sexual Activity: Not on file   Other Topics Concern  . 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 Heart disease Mother     BYPASS HEART SURGERY  . Depression Mother   . Mental illness Mother   . Cancer Mother     skin  . Stroke Maternal Grandmother   . Hypertension Father   . Benign prostatic hyperplasia Father   . Cancer Father     skin  . Diabetes Paternal Grandfather     Current outpatient prescriptions: aspirin 81 MG tablet, Take 81 mg by mouth daily.  , Disp: , Rfl: ;  fish oil-omega-3 fatty acids 1000 MG capsule, Take 2 g by mouth daily.  , Disp: , Rfl: ;  gabapentin (NEURONTIN) 800 MG tablet, Take 800 mg by mouth 3 (three) times daily.  , Disp: , Rfl: ;  LORazepam (ATIVAN) 2 MG tablet, Take 4 mg by mouth daily.  , Disp: , Rfl:  nicotine polacrilex (NICORETTE) 4 MG gum, Take 4 mg by mouth as needed for smoking cessation., Disp: , Rfl: ;  simvastatin (ZOCOR) 10 MG tablet, TAKE 1 TABLET BY MOUTH DAILY AT BEDTIME, Disp: 30 tablet, Rfl: 0;  cholecalciferol (VITAMIN D) 1000 UNITS tablet, Take 1,000 Units by mouth daily.  , Disp: , Rfl:   Allergies  Allergen Reactions  . Prozac [Fluoxetine Hcl]  SSRIs in general cause worsened depression and suicidal ideation     Review of Systems Constitutional: -fever, -chills, -sweats, -unexpected weight change, -decreased appetite, -fatigue Allergy: -sneezing, -itching, -congestion Dermatology: -changing moles, --rash, -lumps ENT: -runny nose, +ear pain, -sore throat, -hoarseness, -sinus pain, -teeth pain, - ringing in ears, -hearing loss, -nosebleeds Cardiology: -chest pain, -palpitations, -swelling, -difficulty breathing when lying flat, -waking up short of breath Respiratory: -cough, -shortness of breath, -difficulty breathing with exercise or exertion, -wheezing, -coughing up blood Gastroenterology: -abdominal pain, -nausea, -vomiting, -diarrhea, -constipation, -blood in stool, -changes in bowel movement, -difficulty swallowing or eating Hematology: -bleeding, -bruising   Musculoskeletal: +joint aches, +muscle aches, -joint swelling, -back pain, -neck pain, -cramping, -changes in gait Ophthalmology: denies vision changes, eye redness, itching, discharge Urology: -burning with urination, -difficulty urinating, -blood in urine, -urinary frequency, -urgency, -incontinence Neurology: -headache, -weakness, -tingling, -numbness, -memory loss, -falls, -dizziness Psychology: -depressed mood, -agitation, -sleep problems     Objective:   Physical Exam  BP 106/60 mmHg  Pulse 58  Temp(Src) 97.9 F (36.6 C) (Oral)  Resp 15  Ht 6' (1.829 m)  Wt 163 lb (73.936 kg)  BMI 22.10 kg/m2  General appearance: alert, no distress, WD/WN, lean white male Skin: RUQ of abdomen with 58mm roundish lesion, 2 different shades of brown, somewhat ill defined border, left temple with 61mm squarish pink/red, broad lesion with slight raised appearance, rough texture, several seborrheic keratoses on back and flanks, other small brown round benign appearing lesions throughout HEENT: normocephalic, conjunctiva/corneas normal, sclerae anicteric, PERRLA, EOMi, nares patent, no discharge or erythema, pharynx normal Oral cavity: MMM, tongue normal, teeth in good repair Neck: supple, no lymphadenopathy, no thyromegaly, no masses, normal ROM, no bruits Chest: non tender, normal shape and expansion Heart: RRR, normal S1, S2, no murmurs Lungs: CTA bilaterally, no wheezes, rhonchi, or rales Abdomen: +bs, soft, non tender, non distended, no masses, no hepatomegaly, no splenomegaly, no bruits Back: non tender, normal ROM, no scoliosis Musculoskeletal: upper extremities non tender, no obvious deformity, normal ROM throughout, lower extremities non tender, no obvious deformity, normal ROM throughout Extremities: no edema, no cyanosis, no clubbing Pulses: 2+ symmetric, upper and lower extremities, normal cap refill Neurological: alert, oriented x 3, CN2-12 intact, strength normal upper extremities and  lower extremities, sensation normal throughout, DTRs 2+ throughout, no cerebellar signs, gait normal Psychiatric: normal affect, behavior normal, pleasant  GU: normal male external genitalia, circumcised, small reducible inguinal hernia on the right, nontender, no masses, no lymphadenopathy Rectal: anus normal appearing, normal tone, prostate mildly enlarged, no nodules, occult negative stool  Assessment and Plan :      Encounter Diagnoses  Name Primary?  . Encounter for health maintenance examination in adult Yes  . Need for prophylactic vaccination and inoculation against influenza   . Need for Tdap vaccination   . Vaccine counseling   . Second hand tobacco smoke exposure   . Dyslipidemia   . Screening for prostate cancer   . Impaired fasting glucose   . CRP elevated   . Screening for glaucoma   . Family history of skin cancer   . Numerous moles     Physical exam - discussed healthy lifestyle, diet, exercise, preventative care, vaccinations, and addressed their concerns.    Counseled on the influenza virus vaccine.  Vaccine information sheet given.  Influenza vaccine given after consent obtained.  Counseled on the Tdap (tetanus, diptheria, and acellular pertussis) vaccine.  Vaccine information sheet given. Tdap vaccine given after consent obtained.  Dyslipidemia -  labs today, compliant with medication  Elevated CRP - reviewed cardiology eval from 2014 and updated chart  Colon cancer screening - reviewed colonoscopy from 2014 and updated chart  Specific recommendations today include:  We updated your Tdap (tetanus, diptheria, and acellular pertussis) vaccine today  We updated your influenza vaccine today  Check insurance coverage for shingles and pneumonia vaccine copays and return for these if able  We will call with lab results.  Your labs last year showed borderline diabetes and high cholesterol  Thank you for going to cardiology and gastroenterology last year for  updating screenings  I recommend you see a dermatologist for skin surveillance given the family history of skin cancer See your eye doctor yearly for routine vision care. See your dentist yearly for routine dental care including hygiene visits twice yearly. Exercise regularly, eat healthy   Follow-up pending labs

## 2014-07-31 NOTE — Addendum Note (Signed)
Addended by: Louie Bun on: 07/31/2014 10:00 AM   Modules accepted: Orders

## 2014-07-31 NOTE — Addendum Note (Signed)
Addended by: Louie Bun on: 07/31/2014 09:53 AM   Modules accepted: Orders

## 2014-07-31 NOTE — Patient Instructions (Signed)
  Thank you for giving me the opportunity to serve you today.    Your diagnosis today includes: Encounter Diagnoses  Name Primary?  . Encounter for health maintenance examination in adult Yes  . Need for prophylactic vaccination and inoculation against influenza   . Need for Tdap vaccination   . Vaccine counseling   . Second hand tobacco smoke exposure   . Dyslipidemia   . Screening for prostate cancer   . Impaired fasting glucose   . CRP elevated   . Screening for glaucoma   . Family history of skin cancer   . Numerous moles      Specific recommendations today include:  We updated your Tdap (tetanus, diptheria, and acellular pertussis) vaccine today  We updated your influenza vaccine today  Check insurance coverage for shingles and pneumonia vaccine copays and return for these if able  We will call with lab results.  Your labs last year showed borderline diabetes and high cholesterol  Thank you for going to cardiology and gastroenterology last year for updating screenings  I recommend you see a dermatologist for skin surveillance given the family history of skin cancer See your eye doctor yearly for routine vision care. See your dentist yearly for routine dental care including hygiene visits twice yearly. Exercise regularly, eat healthy  Return pending labs.

## 2014-08-01 ENCOUNTER — Other Ambulatory Visit: Payer: Self-pay | Admitting: Medical

## 2014-08-01 LAB — CBC
HCT: 42.4 % (ref 39.0–52.0)
Hemoglobin: 14 g/dL (ref 13.0–17.0)
MCH: 28.9 pg (ref 26.0–34.0)
MCHC: 33 g/dL (ref 30.0–36.0)
MCV: 87.6 fL (ref 78.0–100.0)
PLATELETS: 232 10*3/uL (ref 150–400)
RBC: 4.84 MIL/uL (ref 4.22–5.81)
RDW: 14 % (ref 11.5–15.5)
WBC: 5.3 10*3/uL (ref 4.0–10.5)

## 2014-08-01 LAB — HEMOGLOBIN A1C
Hgb A1c MFr Bld: 6 % — ABNORMAL HIGH (ref <5.7)
MEAN PLASMA GLUCOSE: 126 mg/dL — AB (ref <117)

## 2014-08-01 LAB — LIPID PANEL
CHOL/HDL RATIO: 2.1 ratio
CHOLESTEROL: 147 mg/dL (ref 0–200)
HDL: 69 mg/dL (ref >39)
LDL Cholesterol: 69 mg/dL (ref 0–99)
Triglycerides: 46 mg/dL (ref <150)
VLDL: 9 mg/dL (ref 0–40)

## 2014-08-01 LAB — COMPREHENSIVE METABOLIC PANEL
ALBUMIN: 4.7 g/dL (ref 3.5–5.2)
ALT: 22 U/L (ref 0–53)
AST: 20 U/L (ref 0–37)
Alkaline Phosphatase: 49 U/L (ref 39–117)
BILIRUBIN TOTAL: 0.7 mg/dL (ref 0.2–1.2)
BUN: 18 mg/dL (ref 6–23)
CO2: 29 meq/L (ref 19–32)
Calcium: 9.4 mg/dL (ref 8.4–10.5)
Chloride: 101 mEq/L (ref 96–112)
Creat: 0.86 mg/dL (ref 0.50–1.35)
Glucose, Bld: 97 mg/dL (ref 70–99)
Potassium: 4.6 mEq/L (ref 3.5–5.3)
SODIUM: 135 meq/L (ref 135–145)
TOTAL PROTEIN: 6.4 g/dL (ref 6.0–8.3)

## 2014-08-01 LAB — PSA: PSA: 1.54 ng/mL (ref <=4.00)

## 2014-08-01 MED ORDER — SIMVASTATIN 10 MG PO TABS: 10.0000 mg | ORAL_TABLET | Freq: Every day | ORAL | Status: DC

## 2015-06-15 ENCOUNTER — Telehealth: Payer: Self-pay

## 2015-06-15 NOTE — Telephone Encounter (Signed)
Per Rep at [facility], no PA is needed for Brilinta. Sent this, with a ref # [id number] to his pharmacy.

## 2015-08-03 ENCOUNTER — Ambulatory Visit (INDEPENDENT_AMBULATORY_CARE_PROVIDER_SITE_OTHER): Payer: BLUE CROSS/BLUE SHIELD | Admitting: Medical

## 2015-08-03 ENCOUNTER — Encounter: Payer: Self-pay | Admitting: Medical

## 2015-08-03 VITALS — BP 110/60 | HR 52 | Ht 73.0 in | Wt 162.0 lb

## 2015-08-03 DIAGNOSIS — Z23 Encounter for immunization: Secondary | ICD-10-CM | POA: Insufficient documentation

## 2015-08-03 DIAGNOSIS — Z7185 Encounter for immunization safety counseling: Secondary | ICD-10-CM

## 2015-08-03 DIAGNOSIS — Z7189 Other specified counseling: Secondary | ICD-10-CM | POA: Diagnosis not present

## 2015-08-03 DIAGNOSIS — G4700 Insomnia, unspecified: Secondary | ICD-10-CM | POA: Diagnosis not present

## 2015-08-03 DIAGNOSIS — F411 Generalized anxiety disorder: Secondary | ICD-10-CM

## 2015-08-03 DIAGNOSIS — R7301 Impaired fasting glucose: Secondary | ICD-10-CM

## 2015-08-03 DIAGNOSIS — E785 Hyperlipidemia, unspecified: Secondary | ICD-10-CM

## 2015-08-03 DIAGNOSIS — Z7722 Contact with and (suspected) exposure to environmental tobacco smoke (acute) (chronic): Secondary | ICD-10-CM

## 2015-08-03 DIAGNOSIS — Z125 Encounter for screening for malignant neoplasm of prostate: Secondary | ICD-10-CM | POA: Insufficient documentation

## 2015-08-03 DIAGNOSIS — Z0101 Encounter for examination of eyes and vision with abnormal findings: Secondary | ICD-10-CM | POA: Insufficient documentation

## 2015-08-03 DIAGNOSIS — Z0000 Encounter for general adult medical examination without abnormal findings: Secondary | ICD-10-CM

## 2015-08-03 LAB — CBC
HEMATOCRIT: 42 % (ref 39.0–52.0)
HEMOGLOBIN: 13.8 g/dL (ref 13.0–17.0)
MCH: 28.7 pg (ref 26.0–34.0)
MCHC: 32.9 g/dL (ref 30.0–36.0)
MCV: 87.3 fL (ref 78.0–100.0)
MPV: 10.7 fL (ref 8.6–12.4)
Platelets: 255 10*3/uL (ref 150–400)
RBC: 4.81 MIL/uL (ref 4.22–5.81)
RDW: 13.4 % (ref 11.5–15.5)
WBC: 6.1 10*3/uL (ref 4.0–10.5)

## 2015-08-03 LAB — POCT URINALYSIS DIPSTICK
BILIRUBIN UA: NEGATIVE
GLUCOSE UA: NEGATIVE
Ketones, UA: NEGATIVE
Leukocytes, UA: NEGATIVE
Nitrite, UA: NEGATIVE
PH UA: 6
Protein, UA: NEGATIVE
RBC UA: NEGATIVE
UROBILINOGEN UA: NEGATIVE

## 2015-08-03 LAB — COMPREHENSIVE METABOLIC PANEL
ALBUMIN: 4.5 g/dL (ref 3.6–5.1)
ALK PHOS: 54 U/L (ref 40–115)
ALT: 34 U/L (ref 9–46)
AST: 27 U/L (ref 10–35)
BUN: 20 mg/dL (ref 7–25)
CO2: 29 mmol/L (ref 20–31)
CREATININE: 0.88 mg/dL (ref 0.70–1.25)
Calcium: 9.2 mg/dL (ref 8.6–10.3)
Chloride: 102 mmol/L (ref 98–110)
Glucose, Bld: 105 mg/dL — ABNORMAL HIGH (ref 65–99)
POTASSIUM: 4.3 mmol/L (ref 3.5–5.3)
Sodium: 140 mmol/L (ref 135–146)
TOTAL PROTEIN: 6.6 g/dL (ref 6.1–8.1)
Total Bilirubin: 0.5 mg/dL (ref 0.2–1.2)

## 2015-08-03 LAB — HEMOGLOBIN A1C
HEMOGLOBIN A1C: 6.1 % — AB (ref <5.7)
MEAN PLASMA GLUCOSE: 128 mg/dL — AB (ref <117)

## 2015-08-03 LAB — LIPID PANEL
CHOLESTEROL: 155 mg/dL (ref 125–200)
HDL: 81 mg/dL (ref >=40)
LDL Cholesterol: 65 mg/dL (ref <130)
TRIGLYCERIDES: 45 mg/dL (ref <150)
Total CHOL/HDL Ratio: 1.9 Ratio (ref <=5.0)
VLDL: 9 mg/dL (ref <30)

## 2015-08-03 NOTE — Addendum Note (Signed)
Addended by: Billie Lade on: 08/03/2015 09:22 AM   Modules accepted: Orders, SmartSet

## 2015-08-03 NOTE — Patient Instructions (Signed)
Encounter Diagnoses  Name Primary?  . Encounter for health maintenance examination in adult Yes  . Second hand tobacco smoke exposure   . Dyslipidemia   . Impaired fasting glucose   . Insomnia   . Generalized anxiety disorder   . Need for prophylactic vaccination and inoculation against influenza   . Vaccine counseling    Recommendations:  See your eye doctor yearly for routine vision care.  See your dentist yearly for routine dental care including hygiene visits twice yearly.  Check insurance coverage for Shingles and Pnuemonmonia vaccines.   If insurance covers both, I recommend you come back and get them soon  Exercise daily  Eat a healthy diet such as mediterranean diet.     Why follow it? Research shows. . Those who follow the Mediterranean diet have a reduced risk of heart disease  . The diet is associated with a reduced incidence of Parkinson's and Alzheimer's diseases . People following the diet may have longer life expectancies and lower rates of chronic diseases  . The Dietary Guidelines for Americans recommends the Mediterranean diet as an eating plan to promote health and prevent disease  What Is the Mediterranean Diet?  . Healthy eating plan based on typical foods and recipes of Mediterranean-style cooking . The diet is primarily a plant based diet; these foods should make up a majority of meals   Starches - Plant based foods should make up a majority of meals - They are an important sources of vitamins, minerals, energy, antioxidants, and fiber - Choose whole grains, foods high in fiber and minimally processed items  - Typical grain sources include wheat, oats, barley, corn, brown rice, bulgar, farro, millet, polenta, couscous  - Various types of beans include chickpeas, lentils, fava beans, black beans, white beans   Fruits  Veggies - Large quantities of antioxidant rich fruits & veggies; 6 or more servings  - Vegetables can be eaten raw or lightly drizzled  with oil and cooked  - Vegetables common to the traditional Mediterranean Diet include: artichokes, arugula, beets, broccoli, brussel sprouts, cabbage, carrots, celery, collard greens, cucumbers, eggplant, kale, leeks, lemons, lettuce, mushrooms, okra, onions, peas, peppers, potatoes, pumpkin, radishes, rutabaga, shallots, spinach, sweet potatoes, turnips, zucchini - Fruits common to the Mediterranean Diet include: apples, apricots, avocados, cherries, clementines, dates, figs, grapefruits, grapes, melons, nectarines, oranges, peaches, pears, pomegranates, strawberries, tangerines  Fats - Replace butter and margarine with healthy oils, such as olive oil, canola oil, and tahini  - Limit nuts to no more than a handful a day  - Nuts include walnuts, almonds, pecans, pistachios, pine nuts  - Limit or avoid candied, honey roasted or heavily salted nuts - Olives are central to the [employer] - can be eaten whole or used in a variety of dishes   Meats Protein - Limiting red meat: no more than a few times a month - When eating red meat: choose lean cuts and keep the portion to the size of deck of cards - Eggs: approx. 0 to 4 times a week  - Fish and lean poultry: at least 2 a week  - Healthy protein sources include, chicken, [place], lean beef, lamb - Increase intake of seafood such as tuna, salmon, trout, mackerel, shrimp, scallops - Avoid or limit high fat processed meats such as sausage and bacon  Dairy - Include moderate amounts of low fat dairy products  - Focus on healthy dairy such as fat free yogurt, skim milk, low or reduced fat cheese -  Limit dairy products higher in fat such as whole or 2% milk, cheese, ice cream  Alcohol - Moderate amounts of red wine is ok  - No more than 5 oz daily for women (all ages) and men older than age 25  - No more than 10 oz of wine daily for men younger than 54  Other - Limit sweets and other desserts  - Use herbs and spices instead of salt to flavor foods   - Herbs and spices common to the traditional Mediterranean Diet include: basil, bay leaves, chives, cloves, cumin, fennel, garlic, lavender, marjoram, mint, oregano, parsley, pepper, rosemary, sage, savory, sumac, tarragon, thyme   It's not just a diet, it's a lifestyle:  . The Mediterranean diet includes lifestyle factors typical of those in the region  . Foods, drinks and meals are best eaten with others and savored . Daily physical activity is important for overall good health . This could be strenuous exercise like running and aerobics . This could also be more leisurely activities such as walking, housework, yard-work, or taking the stairs . Moderation is the key; a balanced and healthy diet accommodates most foods and drinks . Consider portion sizes and frequency of consumption of certain foods   Meal Ideas & Options:  . Breakfast:  o Whole wheat toast or whole wheat English muffins with peanut butter & hard boiled egg o Steel cut oats topped with apples & cinnamon and skim milk  o Fresh fruit: banana, strawberries, melon, berries, peaches  o Smoothies: strawberries, bananas, greek yogurt, peanut butter o Low fat greek yogurt with blueberries and granola  o Egg white omelet with spinach and mushrooms o Breakfast couscous: whole wheat couscous, apricots, skim milk, cranberries  . Sandwiches:  o Hummus and grilled vegetables (peppers, zucchini, squash) on whole wheat bread   o Grilled chicken on whole wheat pita with lettuce, tomatoes, cucumbers or tzatziki  o Tuna salad on whole wheat bread: tuna salad made with greek yogurt, olives, red peppers, capers, green onions o Garlic rosemary lamb pita: lamb sauted with garlic, rosemary, salt & pepper; add lettuce, cucumber, greek yogurt to pita - flavor with lemon juice and black pepper  . Seafood:  o Mediterranean grilled salmon, seasoned with garlic, basil, parsley, lemon juice and black pepper o Shrimp, lemon, and spinach whole-grain  pasta salad made with low fat greek yogurt  o Seared scallops with lemon orzo  o Seared tuna steaks seasoned salt, pepper, coriander topped with tomato mixture of olives, tomatoes, olive oil, minced garlic, parsley, green onions and cappers  . Meats:  o Herbed greek chicken salad with kalamata olives, cucumber, feta  o Red bell peppers stuffed with spinach, bulgur, lean ground beef (or lentils) & topped with feta   o Kebabs: skewers of chicken, tomatoes, onions, zucchini, squash  o [place] burgers: made with red onions, mint, dill, lemon juice, feta cheese topped with roasted red peppers . Vegetarian o Cucumber salad: cucumbers, artichoke hearts, celery, red onion, feta cheese, tossed in olive oil & lemon juice  o Hummus and whole grain pita points with a greek salad (lettuce, tomato, feta, olives, cucumbers, red onion) o Lentil soup with celery, carrots made with vegetable broth, garlic, salt and pepper  o Tabouli salad: parsley, bulgur, mint, scallions, cucumbers, tomato, radishes, lemon juice, olive oil, salt and pepper.

## 2015-08-03 NOTE — Progress Notes (Signed)
Subjective:   HPI  Paul Klein is a 59 y.o. male who presents for a complete physical.   Concerns: none  Dyslipidemia - compliant with medication without c/o.  Reviewed their medical, surgical, family, social, medication, and allergy history and updated chart as appropriate.  Past Medical History  Diagnosis Date  . Dyslipidemia   . Anxiety     Dr. Toy Care  . Panic attack   . Insomnia     uses Neurontin ( Dr. Toy Care)  . Second hand smoke exposure     wife  . Hemorrhoids     internal and externa per 2014 colonoscopy  . Diverticulosis of colon     sigmoid, per colonoscopy 2014, Dr. Benson Norway  . Normal cardiac stress test 12/2012    performed due to risk factors, abnormal EKG; Dr. Wynonia Lawman    Past Surgical History  Procedure Laterality Date  . Anal fissure repair    . Wisdom tooth extraction    . Skin biopsy      face, back  . Colonoscopy  02/19/13    diverticulosis of sigmoid colon, external and internal hemorrhoids, Dr. Benson Norway, repeat in 10 years    Social History   Social History  . Marital Status: Married    Spouse Name: N/A  . Number of Children: N/A  . Years of Education: N/A   Occupational History  . Not on file.   Social History Main Topics  . Smoking status: Passive Smoke Exposure - Never Smoker  . Smokeless tobacco: Never Used  . Alcohol Use: No  . Drug Use: No     Comment: marijuana in the past  . Sexual Activity: Not on file   Other Topics Concern  . 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 Heart disease Mother     BYPASS HEART SURGERY  . Depression Mother   . Mental illness Mother   . Cancer Mother     skin  . Stroke Maternal Grandmother   . Hypertension Father   . Benign prostatic hyperplasia Father   . Cancer Father     skin  . Diabetes Paternal Grandfather      Current  outpatient prescriptions:  .  aspirin 81 MG tablet, Take 81 mg by mouth daily.  , Disp: , Rfl:  .  fish oil-omega-3 fatty acids 1000 MG capsule, Take 2 g by mouth daily.  , Disp: , Rfl:  .  gabapentin (NEURONTIN) 800 MG tablet, Take 800 mg by mouth 3 (three) times daily.  , Disp: , Rfl:  .  LORazepam (ATIVAN) 2 MG tablet, Take 4 mg by mouth daily.  , Disp: , Rfl:  .  Multiple Vitamin (MULTIVITAMIN) tablet, Take 1 tablet by mouth daily., Disp: , Rfl:  .  nicotine polacrilex (NICORETTE) 4 MG gum, Take 4 mg by mouth as needed for smoking cessation., Disp: , Rfl:  .  simvastatin (ZOCOR) 10 MG tablet, Take 1 tablet (10 mg total) by mouth daily at 6 PM., Disp: 90 tablet, Rfl: 3 .  cholecalciferol (VITAMIN D) 1000 UNITS tablet, Take 1,000 Units by mouth daily.  , Disp: , Rfl:   Allergies  Allergen Reactions  . Prozac [Fluoxetine Hcl]     SSRIs in general cause worsened depression and suicidal ideation     Review of Systems Constitutional: -  fever, -chills, -sweats, -unexpected weight change, -decreased appetite, -fatigue Allergy: -sneezing, -itching, -congestion Dermatology: -changing moles, --rash, -lumps ENT: -runny nose, -ear pain, -sore throat, -hoarseness, -sinus pain, -teeth pain, - ringing in ears, -hearing loss, -nosebleeds Cardiology: -chest pain, -palpitations, -swelling, -difficulty breathing when lying flat, -waking up short of breath Respiratory: -cough, -shortness of breath, -difficulty breathing with exercise or exertion, -wheezing, -coughing up blood Gastroenterology: -abdominal pain, -nausea, -vomiting, -diarrhea, -constipation, -blood in stool, -changes in bowel movement, -difficulty swallowing or eating Hematology: -bleeding, -bruising  Musculoskeletal: -joint aches, -muscle aches, -joint swelling, -back pain, -neck pain, -cramping, -changes in gait Ophthalmology: denies vision changes, eye redness, itching, discharge Urology: -burning with urination, -difficulty urinating,  -blood in urine, -urinary frequency, -urgency, -incontinence Neurology: -headache, -weakness, -tingling, -numbness, -memory loss, -falls, -dizziness Psychology: -depressed mood, -agitation, -sleep problems     Objective:   Physical Exam  BP 110/60 mmHg  Pulse 52  Ht 6\' 1"  (1.854 m)  Wt 162 lb (73.483 kg)  BMI 21.38 kg/m2  SpO2 99%  General appearance: alert, no distress, WD/WN, lean white male Skin: several seborrheic keratoses on back and flanks, other small brown round benign appearing lesions throughout HEENT: normocephalic, conjunctiva/corneas normal, sclerae anicteric, PERRLA, EOMi, nares patent, no discharge or erythema, pharynx normal Oral cavity: MMM, tongue normal, teeth in good repair Neck: supple, no lymphadenopathy, no thyromegaly, no masses, normal ROM, no bruits Chest: non tender, normal shape and expansion Heart: RRR, normal S1, S2, no murmurs Lungs: CTA bilaterally, no wheezes, rhonchi, or rales Abdomen: +bs, soft, non tender, non distended, no masses, no hepatomegaly, no splenomegaly, no bruits Back: non tender, normal ROM, no scoliosis Musculoskeletal: upper extremities non tender, no obvious deformity, normal ROM throughout, lower extremities non tender, no obvious deformity, normal ROM throughout Extremities: no edema, no cyanosis, no clubbing Pulses: 2+ symmetric, upper and lower extremities, normal cap refill Neurological: alert, oriented x 3, CN2-12 intact, strength normal upper extremities and lower extremities, sensation normal throughout, DTRs 2+ throughout, no cerebellar signs, gait normal Psychiatric: normal affect, behavior normal, pleasant  GU: deferred Rectal: deferred  Assessment and Plan :      Encounter Diagnoses  Name Primary?  . Encounter for health maintenance examination in adult Yes  . Second hand tobacco smoke exposure   . Dyslipidemia   . Impaired fasting glucose   . Insomnia   . Generalized anxiety disorder   . Need for  prophylactic vaccination and inoculation against influenza   . Vaccine counseling     Physical exam - discussed healthy lifestyle, diet, exercise, preventative care, vaccinations, and addressed their concerns.   Counseled on the influenza virus vaccine.  Vaccine information sheet given.  Influenza vaccine given after consent obtained. Dyslipidemia - labs today, compliant with medication  Specific recommendations today include:  Check insurance coverage for shingles and pneumonia vaccine copays and return for these if able  We will call with lab results.  Your labs last year showed borderline diabetes and high cholesterol  I recommend you see a dermatologist for skin surveillance yearly given the family history of skin cancer See your eye doctor yearly for routine vision care. See your dentist yearly for routine dental care including hygiene visits twice yearly. Exercise regularly, eat healthy   Follow-up pending labs

## 2015-08-04 ENCOUNTER — Other Ambulatory Visit: Payer: Self-pay | Admitting: Medical

## 2015-08-04 MED ORDER — SIMVASTATIN 10 MG PO TABS: 10.0000 mg | ORAL_TABLET | Freq: Every day | ORAL | Status: DC

## 2015-08-04 MED ORDER — ASPIRIN 81 MG PO TABS: 81.0000 mg | ORAL_TABLET | Freq: Every day | ORAL | Status: DC

## 2015-10-20 ENCOUNTER — Other Ambulatory Visit: Payer: Self-pay | Admitting: Medical

## 2016-06-30 ENCOUNTER — Other Ambulatory Visit: Payer: Self-pay | Admitting: Medical

## 2016-08-30 ENCOUNTER — Ambulatory Visit (INDEPENDENT_AMBULATORY_CARE_PROVIDER_SITE_OTHER): Payer: BLUE CROSS/BLUE SHIELD | Admitting: Medical

## 2016-08-30 ENCOUNTER — Encounter: Payer: Self-pay | Admitting: Medical

## 2016-08-30 VITALS — BP 140/86 | HR 68 | Ht 75.0 in | Wt 176.8 lb

## 2016-08-30 DIAGNOSIS — Z7722 Contact with and (suspected) exposure to environmental tobacco smoke (acute) (chronic): Secondary | ICD-10-CM

## 2016-08-30 DIAGNOSIS — R7301 Impaired fasting glucose: Secondary | ICD-10-CM | POA: Diagnosis not present

## 2016-08-30 DIAGNOSIS — B351 Tinea unguium: Secondary | ICD-10-CM | POA: Insufficient documentation

## 2016-08-30 DIAGNOSIS — Z0000 Encounter for general adult medical examination without abnormal findings: Secondary | ICD-10-CM | POA: Diagnosis not present

## 2016-08-30 DIAGNOSIS — Z7189 Other specified counseling: Secondary | ICD-10-CM

## 2016-08-30 DIAGNOSIS — Z23 Encounter for immunization: Secondary | ICD-10-CM | POA: Diagnosis not present

## 2016-08-30 DIAGNOSIS — Z7185 Encounter for immunization safety counseling: Secondary | ICD-10-CM

## 2016-08-30 DIAGNOSIS — G4700 Insomnia, unspecified: Secondary | ICD-10-CM | POA: Diagnosis not present

## 2016-08-30 DIAGNOSIS — E785 Hyperlipidemia, unspecified: Secondary | ICD-10-CM | POA: Diagnosis not present

## 2016-08-30 DIAGNOSIS — F411 Generalized anxiety disorder: Secondary | ICD-10-CM

## 2016-08-30 LAB — TSH: TSH: 1.35 m[IU]/L (ref 0.40–4.50)

## 2016-08-30 LAB — COMPREHENSIVE METABOLIC PANEL
ALK PHOS: 67 U/L (ref 40–115)
ALT: 35 U/L (ref 9–46)
AST: 25 U/L (ref 10–35)
Albumin: 4.8 g/dL (ref 3.6–5.1)
BILIRUBIN TOTAL: 0.7 mg/dL (ref 0.2–1.2)
BUN: 12 mg/dL (ref 7–25)
CALCIUM: 9.7 mg/dL (ref 8.6–10.3)
CO2: 30 mmol/L (ref 20–31)
CREATININE: 1.08 mg/dL (ref 0.70–1.25)
Chloride: 101 mmol/L (ref 98–110)
GLUCOSE: 112 mg/dL — AB (ref 65–99)
Potassium: 4.1 mmol/L (ref 3.5–5.3)
SODIUM: 139 mmol/L (ref 135–146)
Total Protein: 7 g/dL (ref 6.1–8.1)

## 2016-08-30 LAB — LIPID PANEL
Cholesterol: 170 mg/dL (ref <200)
HDL: 73 mg/dL (ref >40)
LDL Cholesterol: 80 mg/dL (ref <100)
Total CHOL/HDL Ratio: 2.3 Ratio (ref <5.0)
Triglycerides: 85 mg/dL (ref <150)
VLDL: 17 mg/dL (ref <30)

## 2016-08-30 LAB — POCT URINALYSIS DIPSTICK
Bilirubin, UA: NEGATIVE
Blood, UA: NEGATIVE
Glucose, UA: NEGATIVE
Ketones, UA: NEGATIVE
LEUKOCYTES UA: NEGATIVE
NITRITE UA: NEGATIVE
PH UA: 6.5
PROTEIN UA: NEGATIVE
Spec Grav, UA: 1.015
Urobilinogen, UA: NEGATIVE

## 2016-08-30 LAB — CBC
HCT: 44.9 % (ref 38.5–50.0)
HEMOGLOBIN: 14.6 g/dL (ref 13.2–17.1)
MCH: 29.5 pg (ref 27.0–33.0)
MCHC: 32.5 g/dL (ref 32.0–36.0)
MCV: 90.7 fL (ref 80.0–100.0)
MPV: 10.7 fL (ref 7.5–12.5)
PLATELETS: 289 10*3/uL (ref 140–400)
RBC: 4.95 MIL/uL (ref 4.20–5.80)
RDW: 13.3 % (ref 11.0–15.0)
WBC: 7.3 10*3/uL (ref 4.0–10.5)

## 2016-08-30 MED ORDER — TERBINAFINE HCL 250 MG PO TABS: 250.0000 mg | ORAL_TABLET | Freq: Every day | ORAL | 1 refills | Status: DC

## 2016-08-30 MED ORDER — ASPIRIN 81 MG PO TABS: 81.0000 mg | ORAL_TABLET | Freq: Every day | ORAL | 3 refills | Status: DC

## 2016-08-30 NOTE — Progress Notes (Signed)
Subjective:   HPI  Paul Klein is a 60 y.o. male who presents for a complete physical.   Concerns: none  Retired around a year ago.  His father had CABG, so he and brother had to help with his mother as she has bad dementia.   He is doing great now.   Father turned 69 yo yesterday.  Dyslipidemia - compliant with medication without c/o.    Insomnia - takes Gabapentin for sleep.  Ativan for anxiety.  Sees Dr. Ernestene Mention dermatology last year for surveillance.    Reviewed their medical, surgical, family, social, medication, and allergy history and updated chart as appropriate.  Past Medical History:  Diagnosis Date  . Anxiety    Dr. Toy Care  . Diverticulosis of colon    sigmoid, per colonoscopy 2014, Dr. Benson Norway  . Dyslipidemia   . Hemorrhoids    internal and externa per 2014 colonoscopy  . Insomnia    uses Neurontin ( Dr. Toy Care)  . Normal cardiac stress test 12/2012   performed due to risk factors, abnormal EKG; Dr. Wynonia Lawman  . Panic attack   . Second hand smoke exposure    wife    Past Surgical History:  Procedure Laterality Date  . ANAL FISSURE REPAIR    . COLONOSCOPY  02/19/13   diverticulosis of sigmoid colon, external and internal hemorrhoids, Dr. Benson Norway, repeat in 10 years  . SKIN BIOPSY     face, back  . WISDOM TOOTH EXTRACTION      Social History   Social History  . Marital status: Married    Spouse name: N/A  . Number of children: N/A  . Years of education: N/A   Occupational History  . Not on file.   Social History Main Topics  . Smoking status: Passive Smoke Exposure - Never Smoker  . Smokeless tobacco: Never Used  . Alcohol use No  . Drug use: No     Comment: marijuana in the past  . Sexual activity: Not on file   Other Topics Concern  . Not on file   Social History Narrative   Walks the dog, does calisthenics.    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 1 grandchild lives in [place], other  grandchild teenager, wants to spend time with his friends. 08/2016    Family History  Problem Relation Age of Onset  . Heart disease Mother     BYPASS HEART SURGERY  . Depression Mother   . Mental illness Mother   . Cancer Mother     skin  . Dementia Mother   . Hypertension Father   . Benign prostatic hyperplasia Father   . Cancer Father     skin  . Heart disease Father 66    CABG  . Depression Sister   . Stroke Maternal Grandmother   . Diabetes Paternal Grandfather      Current Outpatient Prescriptions:  .  aspirin 81 MG tablet, Take 1 tablet (81 mg total) by mouth daily., Disp: 90 tablet, Rfl: 3 .  fish oil-omega-3 fatty acids 1000 MG capsule, Take 2 g by mouth daily.  , Disp: , Rfl:  .  gabapentin (NEURONTIN) 800 MG tablet, Take 800 mg by mouth 3 (three) times daily.  , Disp: , Rfl:  .  LORazepam (ATIVAN) 2 MG tablet, Take 4 mg by mouth daily.  , Disp: , Rfl:  .  Multiple Vitamin (MULTIVITAMIN) tablet, Take 1 tablet by  mouth daily., Disp: , Rfl:  .  simvastatin (ZOCOR) 10 MG tablet, TAKE 1 TABLET BY MOUTH DAILY AT 6 PM, Disp: 90 tablet, Rfl: 0 .  cholecalciferol (VITAMIN D) 1000 UNITS tablet, Take 1,000 Units by mouth daily.  , Disp: , Rfl:  .  terbinafine (LAMISIL) 250 MG tablet, Take 1 tablet (250 mg total) by mouth daily., Disp: 30 tablet, Rfl: 1  Allergies  Allergen Reactions  . Prozac [Fluoxetine Hcl]     SSRIs in general cause worsened depression and suicidal ideation     Review of Systems Constitutional: -fever, -chills, -sweats, -unexpected weight change, -decreased appetite, -fatigue Allergy: -sneezing, -itching, -congestion Dermatology: -changing moles, --rash, -lumps ENT: -runny nose, -ear pain, -sore throat, -hoarseness, -sinus pain, -teeth pain, - ringing in ears, -hearing loss, -nosebleeds Cardiology: -chest pain, -palpitations, -swelling, -difficulty breathing when lying flat, -waking up short of breath Respiratory: -cough, -shortness of breath,  -difficulty breathing with exercise or exertion, -wheezing, -coughing up blood Gastroenterology: -abdominal pain, -nausea, -vomiting, -diarrhea, -constipation, -blood in stool, -changes in bowel movement, -difficulty swallowing or eating Hematology: -bleeding, -bruising  Musculoskeletal: -joint aches, -muscle aches, -joint swelling, -back pain, -neck pain, -cramping, -changes in gait Ophthalmology: denies vision changes, eye redness, itching, discharge Urology: -burning with urination, -difficulty urinating, -blood in urine, -urinary frequency, -urgency, -incontinence Neurology: -headache, -weakness, -tingling, -numbness, -memory loss, -falls, -dizziness Psychology: -depressed mood, -agitation, -sleep problems     Objective:   Physical Exam  BP 140/86   Pulse 68   Ht 6\' 3"  (1.905 m)   Wt 176 lb 12.8 oz (80.2 kg)   SpO2 95%   BMI 22.10 kg/m   General appearance: alert, no distress, WD/WN, lean white male Skin: several seborrheic keratoses on back and flanks, other small brown round benign appearing lesions throughout, great toenails slightly thickened with yellowish coloration throughout suggestive of fungus HEENT: normocephalic, conjunctiva/corneas normal, sclerae anicteric, PERRLA, EOMi, nares patent, no discharge or erythema, pharynx normal Oral cavity: MMM, tongue normal, teeth in good repair Neck: supple, no lymphadenopathy, no thyromegaly, no masses, normal ROM, no bruits Chest: non tender, normal shape and expansion Heart: RRR, normal S1, S2, no murmurs Lungs: CTA bilaterally, no wheezes, rhonchi, or rales Abdomen: +bs, soft, non tender, non distended, no masses, no hepatomegaly, no splenomegaly, no bruits Back: non tender, normal ROM, no scoliosis Musculoskeletal: upper extremities non tender, no obvious deformity, normal ROM throughout, lower extremities non tender, no obvious deformity, normal ROM throughout Extremities: no edema, no cyanosis, no clubbing Pulses: 2+  symmetric, upper and lower extremities, normal cap refill Neurological: alert, oriented x 3, CN2-12 intact, strength normal upper extremities and lower extremities, sensation normal throughout, DTRs 2+ throughout, no cerebellar signs, gait normal Psychiatric: normal affect, behavior normal, pleasant  GU: normal male, circumcised, no nodule, no swelling, no hernia or lymphadenopathy Rectal: deferred  Assessment and Plan :      Encounter Diagnoses  Name Primary?  . Routine general medical examination at a health care facility Yes  . Needs flu shot   . Encounter for health maintenance examination in adult   . Impaired fasting glucose   . Dyslipidemia   . Generalized anxiety disorder   . Insomnia, unspecified type   . Vaccine counseling   . Second hand tobacco smoke exposure   . Need for prophylactic vaccination and inoculation against influenza   . Onychomycosis     Physical exam - discussed healthy lifestyle, diet, exercise, preventative care, vaccinations, and addressed their concerns.   Counseled on  the influenza virus vaccine.  Vaccine information sheet given.  Influenza vaccine given after consent obtained. Dyslipidemia - labs today, compliant with medication  Specific recommendations today include:  Check insurance coverage for shingles and pneumonia vaccine copays and return for these if able We will call with lab results.  See your eye doctor yearly for routine vision care. See your dentist yearly for routine dental care including hygiene visits twice yearly. Exercise regularly, eat healthy   Follow-up pending labs  Paul Klein was seen today for physical.  Diagnoses and all orders for this visit:  Routine general medical examination at a health care facility -     Urinalysis Dipstick -     Hemoglobin A1c -     Comprehensive metabolic panel -     Lipid panel -     TSH -     CBC  Needs flu shot -     Flu Vaccine QUAD 36+ mos IM  Encounter for health maintenance  examination in adult  Impaired fasting glucose  Dyslipidemia  Generalized anxiety disorder  Insomnia, unspecified type  Vaccine counseling  Second hand tobacco smoke exposure  Need for prophylactic vaccination and inoculation against influenza  Onychomycosis  Other orders -     terbinafine (LAMISIL) 250 MG tablet; Take 1 tablet (250 mg total) by mouth daily. -     aspirin 81 MG tablet; Take 1 tablet (81 mg total) by mouth daily.

## 2016-08-30 NOTE — Patient Instructions (Signed)
Specific recommendations today include:  Check insurance coverage for shingles and pneumonia vaccine copays and return for these if able We will call with lab results.  See your eye doctor yearly for routine vision care. See your dentist yearly for routine dental care including hygiene visits twice yearly. Exercise regularly, eat healthy

## 2016-08-31 ENCOUNTER — Other Ambulatory Visit: Payer: Self-pay | Admitting: Medical

## 2016-08-31 LAB — HEMOGLOBIN A1C
HEMOGLOBIN A1C: 5.5 % (ref <5.7)
MEAN PLASMA GLUCOSE: 111 mg/dL

## 2016-08-31 MED ORDER — SIMVASTATIN 10 MG PO TABS
ORAL_TABLET | ORAL | 3 refills | Status: DC
Start: 2016-08-31 — End: 2017-11-22

## 2016-11-09 ENCOUNTER — Other Ambulatory Visit: Payer: Self-pay | Admitting: Medical

## 2017-08-31 ENCOUNTER — Encounter: Payer: Self-pay | Admitting: Medical

## 2017-08-31 ENCOUNTER — Ambulatory Visit: Payer: BLUE CROSS/BLUE SHIELD | Admitting: Medical

## 2017-08-31 VITALS — BP 126/82 | HR 47 | Ht 71.0 in | Wt 173.8 lb

## 2017-08-31 DIAGNOSIS — Z7189 Other specified counseling: Secondary | ICD-10-CM

## 2017-08-31 DIAGNOSIS — Z0000 Encounter for general adult medical examination without abnormal findings: Secondary | ICD-10-CM

## 2017-08-31 DIAGNOSIS — Z125 Encounter for screening for malignant neoplasm of prostate: Secondary | ICD-10-CM | POA: Diagnosis not present

## 2017-08-31 DIAGNOSIS — R7301 Impaired fasting glucose: Secondary | ICD-10-CM

## 2017-08-31 DIAGNOSIS — F411 Generalized anxiety disorder: Secondary | ICD-10-CM | POA: Diagnosis not present

## 2017-08-31 DIAGNOSIS — E785 Hyperlipidemia, unspecified: Secondary | ICD-10-CM | POA: Diagnosis not present

## 2017-08-31 DIAGNOSIS — Z7185 Encounter for immunization safety counseling: Secondary | ICD-10-CM

## 2017-08-31 DIAGNOSIS — G4700 Insomnia, unspecified: Secondary | ICD-10-CM

## 2017-08-31 LAB — POCT URINALYSIS DIP (PROADVANTAGE DEVICE)
BILIRUBIN UA: NEGATIVE
BILIRUBIN UA: NEGATIVE mg/dL
GLUCOSE UA: NEGATIVE mg/dL
Leukocytes, UA: NEGATIVE
NITRITE UA: NEGATIVE
Protein Ur, POC: NEGATIVE mg/dL
RBC UA: NEGATIVE
SPECIFIC GRAVITY, URINE: 1.015
Urobilinogen, Ur: NEGATIVE
pH, UA: 6 (ref 5.0–8.0)

## 2017-08-31 NOTE — Patient Instructions (Addendum)
Recommendations See your eye doctor yearly for routine vision care.  See your dentist yearly for routine dental care including hygiene visits twice yearly.  See your dermatologist yearly for routine skin care   Continue regular exercise  Eat a healthy low fat diet  continue your current medications  I recommend you have a shingles vaccine to help prevent shingles or herpes zoster outbreak.   Please call your insurer to inquire about coverage for the Shingrix vaccine given in 2 doses.   Some insurers cover this vaccine after age 60, some cover this after age 43.  If your insurer covers this, then call to schedule appointment to have this vaccine here.

## 2017-08-31 NOTE — Progress Notes (Signed)
Subjective:   HPI  Paul Klein is a 61 y.o. male who presents for a complete physical.   Medical team: Dr. Chucky May, psychiatry [facility], Camelia Eng, PA-C here for PCP Dr. [place] dermatology Dr. Benson Norway, GI Dr. Wynonia Lawman, cardiology   Concerns: None  Had Flu shot 3 weeks ago  Dyslipidemia - compliant with medication without c/o.    Insomnia - takes Gabapentin for sleep.  Ativan for anxiety.  Sees Dr. Toy Care  Reviewed their medical, surgical, family, social, medication, and allergy history and updated chart as appropriate.  Past Medical History:  Diagnosis Date  . Anxiety    Dr. Toy Care  . Diverticulosis of colon    sigmoid, per colonoscopy 2014, Dr. Benson Norway  . Dyslipidemia   . Hemorrhoids    internal and externa per 2014 colonoscopy  . Insomnia    uses Neurontin ( Dr. Toy Care)  . Normal cardiac stress test 12/2012   performed due to risk factors, abnormal EKG; Dr. Wynonia Lawman  . Panic attack   . Second hand smoke exposure    wife    Past Surgical History:  Procedure Laterality Date  . ANAL FISSURE REPAIR    . COLONOSCOPY  02/19/13   diverticulosis of sigmoid colon, external and internal hemorrhoids, Dr. Benson Norway, repeat in 10 years  . SKIN BIOPSY     face, back  . WISDOM TOOTH EXTRACTION      Social History   Socioeconomic History  . Marital status: Married    Spouse name: Not on file  . Number of children: Not on file  . Years of education: Not on file  . Highest education level: Not on file  Social Needs  . Financial resource strain: Not on file  . Food insecurity - worry: Not on file  . Food insecurity - inability: Not on file  . Transportation needs - medical: Not on file  . Transportation needs - non-medical: Not on file  Occupational History  . Not on file  Tobacco Use  . Smoking status: Passive Smoke Exposure - Never Smoker  . Smokeless tobacco: Never Used  Substance and Sexual Activity  . Alcohol use: No  . Drug use: No    Comment: marijuana in the past   . Sexual activity: Not on file  Other Topics Concern  . Not on file  Social History Narrative   Walks the dog, does calisthenics.  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 1 grandchild lives in [place], other grandchild teenager, wants to spend time with his friends. 08/2017    Family History  Problem Relation Age of Onset  . Heart disease Mother        BYPASS HEART SURGERY  . Depression Mother   . Mental illness Mother   . Cancer Mother        skin  . Dementia Mother   . Hypertension Father   . Benign prostatic hyperplasia Father   . Cancer Father        skin  . Heart disease Father 13       CABG  . Depression Sister   . Stroke Maternal Grandmother   . Diabetes Paternal Grandfather      Current Outpatient Medications:  .  aspirin 81 MG tablet, Take 1 tablet (81 mg total) by mouth daily., Disp: 90 tablet, Rfl: 3 .  fish oil-omega-3 fatty acids 1000 MG capsule, Take 2 g by mouth daily.  , Disp: , Rfl:  .  gabapentin (NEURONTIN) 800 MG tablet, Take 800 mg by mouth 3 (three) times daily.  , Disp: , Rfl:  .  LORazepam (ATIVAN) 2 MG tablet, Take 4 mg by mouth daily.  , Disp: , Rfl:  .  Multiple Vitamin (MULTIVITAMIN) tablet, Take 1 tablet by mouth daily., Disp: , Rfl:  .  simvastatin (ZOCOR) 10 MG tablet, TAKE 1 TABLET BY MOUTH DAILY AT 6 PM, Disp: 90 tablet, Rfl: 3  Allergies  Allergen Reactions  . Prozac [Fluoxetine Hcl]     SSRIs in general cause worsened depression and suicidal ideation     Review of Systems Constitutional: -fever, -chills, -sweats, -unexpected weight change, -decreased appetite, -fatigue Allergy: -sneezing, -itching, -congestion Dermatology: -changing moles, --rash, -lumps ENT: -runny nose, -ear pain, -sore throat, -hoarseness, -sinus pain, -teeth pain, - ringing in ears, -hearing loss, -nosebleeds Cardiology: -chest pain, -palpitations, -swelling, -difficulty breathing when lying flat, -waking up  short of breath Respiratory: -cough, -shortness of breath, -difficulty breathing with exercise or exertion, -wheezing, -coughing up blood Gastroenterology: -abdominal pain, -nausea, -vomiting, -diarrhea, -constipation, -blood in stool, -changes in bowel movement, -difficulty swallowing or eating Hematology: -bleeding, -bruising  Musculoskeletal: -joint aches, -muscle aches, -joint swelling, -back pain, -neck pain, -cramping, -changes in gait Ophthalmology: denies vision changes, eye redness, itching, discharge Urology: -burning with urination, -difficulty urinating, -blood in urine, -urinary frequency, -urgency, -incontinence Neurology: -headache, -weakness, -tingling, -numbness, -memory loss, -falls, -dizziness Psychology: -depressed mood, -agitation, -sleep problems     Objective:   Physical Exam  BP 126/82   Pulse (!) 47   Ht 5\' 11"  (1.803 m)   Wt 173 lb 12.8 oz (78.8 kg)   SpO2 99%   BMI 24.24 kg/m   General appearance: alert, no distress, WD/WN, lean white male Skin: several seborrheic keratoses on back and flanks, other small brown round benign appearing lesions throughout, great toenails slightly thickened with yellowish coloration throughout suggestive of fungus HEENT: normocephalic, conjunctiva/corneas normal, sclerae anicteric, PERRLA, EOMi, nares patent, no discharge or erythema, pharynx normal Oral cavity: MMM, tongue normal, teeth in good repair Neck: supple, no lymphadenopathy, no thyromegaly, no masses, normal ROM, no bruits Chest: non tender, normal shape and expansion Heart: RRR, normal S1, S2, no murmurs Lungs: CTA bilaterally, no wheezes, rhonchi, or rales Abdomen: +bs, soft, non tender, non distended, no masses, no hepatomegaly, no splenomegaly, no bruits Back: non tender, normal ROM, no scoliosis Musculoskeletal: upper extremities non tender, no obvious deformity, normal ROM throughout, lower extremities non tender, no obvious deformity, normal ROM  throughout Extremities: no edema, no cyanosis, no clubbing Pulses: 2+ symmetric, upper and lower extremities, normal cap refill Neurological: alert, oriented x 3, CN2-12 intact, strength normal upper extremities and lower extremities, sensation normal throughout, DTRs 2+ throughout, no cerebellar signs, gait normal Psychiatric: normal affect, behavior normal, pleasant  GU: normal male, circumcised, no nodules, no swelling, no hernia or lymphadenopathy Rectal: deferred  Assessment and Plan :      Encounter Diagnoses  Name Primary?  . Encounter for health maintenance examination in adult Yes  . Dyslipidemia   . Impaired fasting glucose   . Generalized anxiety disorder   . Insomnia, unspecified type   . Vaccine counseling   . Screening for prostate cancer     Physical exam - discussed healthy lifestyle, diet, exercise, preventative care, vaccinations, and addressed their concerns.    Dyslipidemia - labs today, compliant with medication  Patient Instructions  Recommendations See your eye doctor yearly for routine vision care.  See your dentist yearly  for routine dental care including hygiene visits twice yearly.  See your dermatologist yearly for routine skin care   Continue regular exercise  Eat a healthy low fat diet  continue your current medications  I recommend you have a shingles vaccine to help prevent shingles or herpes zoster outbreak.   Please call your insurer to inquire about coverage for the Shingrix vaccine given in 2 doses.   Some insurers cover this vaccine after age 37, some cover this after age 62.  If your insurer covers this, then call to schedule appointment to have this vaccine here.   will send home with stool cards x 3  Follow-up pending labs  Abdulraheem was seen today for annual exam.  Diagnoses and all orders for this visit:  Encounter for health maintenance examination in adult -     Lipid panel -     Comprehensive metabolic panel -     CBC -      PSA  Dyslipidemia  Impaired fasting glucose  Generalized anxiety disorder  Insomnia, unspecified type  Vaccine counseling  Screening for prostate cancer -     PSA

## 2017-08-31 NOTE — Addendum Note (Signed)
Addended by: Tyrone Apple on: 08/31/2017 09:18 AM   Modules accepted: Orders

## 2017-09-01 LAB — LIPID PANEL
Cholesterol: 165 mg/dL (ref <200)
HDL: 72 mg/dL (ref >40)
LDL Cholesterol (Calc): 78 mg/dL (calc)
NON-HDL CHOLESTEROL (CALC): 93 mg/dL (ref <130)
TRIGLYCERIDES: 70 mg/dL (ref <150)
Total CHOL/HDL Ratio: 2.3 (calc) (ref <5.0)

## 2017-09-01 LAB — CBC
HCT: 42.9 % (ref 38.5–50.0)
Hemoglobin: 14.6 g/dL (ref 13.2–17.1)
MCH: 29.5 pg (ref 27.0–33.0)
MCHC: 34 g/dL (ref 32.0–36.0)
MCV: 86.7 fL (ref 80.0–100.0)
MPV: 11.2 fL (ref 7.5–12.5)
PLATELETS: 263 10*3/uL (ref 140–400)
RBC: 4.95 10*6/uL (ref 4.20–5.80)
RDW: 12.2 % (ref 11.0–15.0)
WBC: 6.5 10*3/uL (ref 3.8–10.8)

## 2017-09-01 LAB — COMPREHENSIVE METABOLIC PANEL
AG Ratio: 2 (calc) (ref 1.0–2.5)
ALT: 21 U/L (ref 9–46)
AST: 16 U/L (ref 10–35)
Albumin: 4.6 g/dL (ref 3.6–5.1)
Alkaline phosphatase (APISO): 52 U/L (ref 40–115)
BUN: 15 mg/dL (ref 7–25)
CHLORIDE: 103 mmol/L (ref 98–110)
CO2: 29 mmol/L (ref 20–32)
CREATININE: 0.94 mg/dL (ref 0.70–1.25)
Calcium: 10.1 mg/dL (ref 8.6–10.3)
GLOBULIN: 2.3 g/dL (ref 1.9–3.7)
GLUCOSE: 117 mg/dL — AB (ref 65–99)
Potassium: 4.8 mmol/L (ref 3.5–5.3)
Sodium: 139 mmol/L (ref 135–146)
Total Bilirubin: 0.7 mg/dL (ref 0.2–1.2)
Total Protein: 6.9 g/dL (ref 6.1–8.1)

## 2017-09-01 LAB — PSA: PSA: 1.8 ng/mL (ref <=4.0)

## 2017-11-22 ENCOUNTER — Other Ambulatory Visit: Payer: Self-pay | Admitting: Medical

## 2017-11-24 ENCOUNTER — Encounter: Payer: Self-pay | Admitting: Medical

## 2017-11-24 ENCOUNTER — Ambulatory Visit: Payer: BLUE CROSS/BLUE SHIELD | Admitting: Medical

## 2017-11-24 VITALS — BP 134/80 | HR 59 | Temp 98.4°F | Wt 173.0 lb

## 2017-11-24 DIAGNOSIS — J988 Other specified respiratory disorders: Secondary | ICD-10-CM | POA: Diagnosis not present

## 2017-11-24 MED ORDER — AZITHROMYCIN 250 MG PO TABS: ORAL_TABLET | ORAL | 0 refills | Status: DC

## 2017-11-24 NOTE — Progress Notes (Signed)
Subjective:  Paul Klein is a 62 y.o. male who presents for cough.  Started about a week ago with cold symptoms but he continues to have cough, congestion, worse chest congestion, sneezing ,runny nose, subjective fever, some dyspnea and fatigue.  No NVD, no wheezing, no sore throat, no ear pain.  Using OTC cough/congestion medication with some relief.  denies sick contacts.  Patient is not a smoker. No other aggravating or relieving factors.  No other c/o.  The following portions of the patient's history were reviewed and updated as appropriate: allergies, current medications, past family history, past medical history, past social history, past surgical history and problem list.  ROS as in subjective  Past Medical History:  Diagnosis Date  . Anxiety    Dr. Toy Care  . Diverticulosis of colon    sigmoid, per colonoscopy 2014, Dr. Benson Norway  . Dyslipidemia   . Hemorrhoids    internal and externa per 2014 colonoscopy  . Insomnia    uses Neurontin ( Dr. Toy Care)  . Normal cardiac stress test 12/2012   performed due to risk factors, abnormal EKG; Dr. Wynonia Lawman  . Panic attack   . Second hand smoke exposure    wife   Current Outpatient Medications on File Prior to Visit  Medication Sig Dispense Refill  . aspirin 81 MG tablet Take 1 tablet (81 mg total) by mouth daily. 90 tablet 3  . fish oil-omega-3 fatty acids 1000 MG capsule Take 2 g by mouth daily.      [name] gabapentin (NEURONTIN) 800 MG tablet Take 800 mg by mouth 3 (three) times daily.      [name] LORazepam (ATIVAN) 2 MG tablet Take 4 mg by mouth daily.      . Multiple Vitamin (MULTIVITAMIN) tablet Take 1 tablet by mouth daily.    . simvastatin (ZOCOR) 10 MG tablet TAKE 1 TABLET BY MOUTH DAILY AT 6 PM 90 tablet 0   No current facility-administered medications on file prior to visit.     Objective: BP 134/80   Pulse (!) 59   Temp 98.4 F (36.9 C)   Wt 173 lb (78.5 kg)   SpO2 97%   BMI 24.13 kg/m   General appearance: Alert, WD/WN, no  distress, mildly ill appearing                             Skin: warm, no rash, no diaphoresis                           Head: no sinus tenderness                            Eyes: conjunctiva normal, corneas clear, PERRLA                            Ears: flatTMs, external ear canals normal                          Nose: septum midline, turbinates swollen, with erythema and mucoid discharge             Mouth/throat: MMM, tongue normal, mild pharyngeal erythema                           Neck: supple, no  adenopathy, no thyromegaly, nontender                       Lungs: +bronchial breath sounds, - rhonchi, no wheezes, no rales                Extremities: no edema, nontender      Assessment: Encounter Diagnosis  Name Primary?  [name] Respiratory tract infection Yes     Plan:  Discussed diagnosis and treatment recommendations.  Begin zpak.  Suggested symptomatic OTC remedies for cough and congestion.  Tylenol or Ibuprofen OTC for fever and malaise.  Call/return in 2-3 days if symptoms are worse or not improving.  Advised that cough may linger even after the infection is improved.    Paul Klein was seen today for coughing , chills, fever,congestion.  Diagnoses and all orders for this visit:  Respiratory tract infection  Other orders -     azithromycin (ZITHROMAX) 250 MG tablet; 2 tablets day 1, then 1 tablet days 2-4

## 2018-01-19 ENCOUNTER — Other Ambulatory Visit: Payer: Self-pay | Admitting: Medical

## 2018-04-25 ENCOUNTER — Other Ambulatory Visit: Payer: Self-pay | Admitting: Medical

## 2018-04-25 NOTE — Telephone Encounter (Signed)
Left message on for patient to call in to schedule appointment.

## 2018-06-08 ENCOUNTER — Other Ambulatory Visit: Payer: Self-pay | Admitting: Medical

## 2018-07-15 ENCOUNTER — Other Ambulatory Visit: Payer: Self-pay | Admitting: Medical

## 2018-07-18 ENCOUNTER — Encounter: Payer: Self-pay | Admitting: Medical

## 2018-07-18 ENCOUNTER — Ambulatory Visit: Payer: BLUE CROSS/BLUE SHIELD | Admitting: Medical

## 2018-07-18 VITALS — BP 120/76 | HR 46 | Temp 97.8°F | Resp 16 | Ht 71.0 in | Wt 164.2 lb

## 2018-07-18 DIAGNOSIS — Z7189 Other specified counseling: Secondary | ICD-10-CM

## 2018-07-18 DIAGNOSIS — F411 Generalized anxiety disorder: Secondary | ICD-10-CM

## 2018-07-18 DIAGNOSIS — R7301 Impaired fasting glucose: Secondary | ICD-10-CM

## 2018-07-18 DIAGNOSIS — Z7185 Encounter for immunization safety counseling: Secondary | ICD-10-CM

## 2018-07-18 DIAGNOSIS — R001 Bradycardia, unspecified: Secondary | ICD-10-CM

## 2018-07-18 DIAGNOSIS — E785 Hyperlipidemia, unspecified: Secondary | ICD-10-CM

## 2018-07-18 DIAGNOSIS — Z23 Encounter for immunization: Secondary | ICD-10-CM

## 2018-07-18 LAB — COMPREHENSIVE METABOLIC PANEL
ALK PHOS: 67 IU/L (ref 39–117)
ALT: 14 IU/L (ref 0–44)
AST: 19 IU/L (ref 0–40)
Albumin/Globulin Ratio: 2.5 — ABNORMAL HIGH (ref 1.2–2.2)
Albumin: 4.8 g/dL (ref 3.6–4.8)
BUN/Creatinine Ratio: 9 — ABNORMAL LOW (ref 10–24)
BUN: 9 mg/dL (ref 8–27)
Bilirubin Total: 0.7 mg/dL (ref 0.0–1.2)
CO2: 24 mmol/L (ref 20–29)
CREATININE: 1.01 mg/dL (ref 0.76–1.27)
Calcium: 10.2 mg/dL (ref 8.6–10.2)
Chloride: 100 mmol/L (ref 96–106)
GFR calc Af Amer: 90 mL/min/{1.73_m2} (ref >59)
GFR calc non Af Amer: 78 mL/min/{1.73_m2} (ref >59)
GLOBULIN, TOTAL: 1.9 g/dL (ref 1.5–4.5)
Glucose: 109 mg/dL — ABNORMAL HIGH (ref 65–99)
POTASSIUM: 4.7 mmol/L (ref 3.5–5.2)
SODIUM: 138 mmol/L (ref 134–144)
Total Protein: 6.7 g/dL (ref 6.0–8.5)

## 2018-07-18 LAB — CBC
HEMATOCRIT: 43.3 % (ref 37.5–51.0)
HEMOGLOBIN: 14.1 g/dL (ref 13.0–17.7)
MCH: 29 pg (ref 26.6–33.0)
MCHC: 32.6 g/dL (ref 31.5–35.7)
MCV: 89 fL (ref 79–97)
Platelets: 251 10*3/uL (ref 150–450)
RBC: 4.87 x10E6/uL (ref 4.14–5.80)
RDW: 12.8 % (ref 12.3–15.4)
WBC: 6.4 10*3/uL (ref 3.4–10.8)

## 2018-07-18 LAB — HEMOGLOBIN A1C
Est. average glucose Bld gHb Est-mCnc: 117 mg/dL
Hgb A1c MFr Bld: 5.7 % — ABNORMAL HIGH (ref 4.8–5.6)

## 2018-07-18 LAB — LIPID PANEL
CHOLESTEROL TOTAL: 169 mg/dL (ref 100–199)
Chol/HDL Ratio: 2.2 ratio (ref 0.0–5.0)
HDL: 77 mg/dL (ref >39)
LDL CALC: 73 mg/dL (ref 0–99)
Triglycerides: 95 mg/dL (ref 0–149)
VLDL CHOLESTEROL CAL: 19 mg/dL (ref 5–40)

## 2018-07-18 NOTE — Patient Instructions (Signed)
I am glad you are doing well  Continue your current medications  We updated your flu shot today  Shingles vaccine:  I recommend you have a shingles vaccine to help prevent shingles or herpes zoster outbreak.   Please call your insurer to inquire about coverage for the Shingrix vaccine given in 2 doses.   Some insurers cover this vaccine after age 64, some cover this after age 1.  If your insurer covers this, then call to schedule appointment to have this vaccine here.  You will be due for a welcome to Medicare visit in May when you go on Medicare    I recommend exercising most days of the week using a type of exercise that they would enjoy and stick to such as walking, running, swimming, hiking, biking, aerobics, etc.  I recommend a healthy diet.    Do's:   whole grains such as whole grain pasta, rice, whole grains breads and whole grain cereals.  Use small quantities such as 1/2 cup per serving or 2 slices of bread per serving.    Eat 3-5 fruits daily  Eat beans at least once daily  Eat almonds in small quantities at least 3 days per week    If they eat meat, I recommend small portions of lean meats such as chicken, fish, and [place].  Eat as much NON corn and NON potato vegetables as they like, particularly raw or steamed  Drink several large glasses of water daily  Cautions:  Limit red meat  Limit corn and potatoes  Limit sweets, cake, pie, candy  Limit beer and alcohol  Avoid fried food, fast food, large portions  Avoid sugary drinks such as regular soda and sweet tea

## 2018-07-18 NOTE — Addendum Note (Signed)
Addended by: Gwinda Maine on: 07/18/2018 03:16 PM   Modules accepted: Orders

## 2018-07-18 NOTE — Progress Notes (Signed)
Subjective: Chief Complaint  Patient presents with  . med check    fasting med check    Here for med check, fasting.   Mother passed away last month, failure to thrive.  Father still alive.  Exercise - walking dog, trying to eat healthy.  Compliant with medications for lipids.  No other c/o.   Past Medical History:  Diagnosis Date  . Anxiety    Dr. Toy Care  . Diverticulosis of colon    sigmoid, per colonoscopy 2014, Dr. Benson Norway  . Dyslipidemia   . Hemorrhoids    internal and externa per 2014 colonoscopy  . Insomnia    uses Neurontin ( Dr. Toy Care)  . Normal cardiac stress test 12/2012   performed due to risk factors, abnormal EKG; Dr. Wynonia Lawman  . Panic attack   . Second hand smoke exposure    wife   Current Outpatient Medications on File Prior to Visit  Medication Sig Dispense Refill  . aspirin 81 MG tablet Take 1 tablet (81 mg total) by mouth daily. 90 tablet 3  . fish oil-omega-3 fatty acids 1000 MG capsule Take 2 g by mouth daily.      [name] gabapentin (NEURONTIN) 800 MG tablet Take 800 mg by mouth 3 (three) times daily.      [name] LORazepam (ATIVAN) 2 MG tablet Take 4 mg by mouth daily.      . Multiple Vitamin (MULTIVITAMIN) tablet Take 1 tablet by mouth daily.    . simvastatin (ZOCOR) 10 MG tablet TAKE ONE TABLET BY MOUTH ONE TIME DAILY AT 6PM 30 tablet 0   No current facility-administered medications on file prior to visit.    ROS as in subjective     Objective: BP 120/76   Pulse (!) 46   Temp 97.8 F (36.6 C) (Oral)   Resp 16   Ht 5\' 11"  (1.803 m)   Wt 164 lb 3.2 oz (74.5 kg)   SpO2 97%   BMI 22.90 kg/m   General appearance: alert, no distress, WD/WN,  Neck: supple, no lymphadenopathy, no thyromegaly, no masses, no bruits Heart: RRR, normal S1, S2, no murmurs Lungs: CTA bilaterally, no wheezes, rhonchi, or rales Abdomen: +bs, soft, non tender, non distended, no masses, no hepatomegaly, no splenomegaly Pulses: 2+ symmetric, upper and lower extremities, normal cap  refill Ext: no edema   Adult ECG Report  Indication: bradycardia  Rate: 44 bpm  Rhythm: sinus bradycardia  QRS Axis: 86 degrees  PR Interval: 129ms  QRS Duration: 74ms  QTc: 348ms  Conduction Disturbances: none  Other Abnormalities: none  Patient's cardiac risk factors are: dyslipidemia. age  EKG comparison: none  Narrative Interpretation: no acute change    Assessment: Encounter Diagnoses  Name Primary?  . Dyslipidemia Yes  . Impaired fasting glucose   . Generalized anxiety disorder   . Vaccine counseling   . Bradycardia   . Need for influenza vaccination      Plan: expressed sympathy for his loss of mother recently  Routine labs today, c/t current medications.  Counseled on diet and exercise  EKG reviewed today.  Reviewed his 2014 stress test through Dr. Wynonia Lawman  Shingles vaccine:  I recommend you have a shingles vaccine to help prevent shingles or herpes zoster outbreak.   Please call your insurer to inquire about coverage for the Shingrix vaccine given in 2 doses.   Some insurers cover this vaccine after age 34, some cover this after age 45.  If your insurer covers this, then call to schedule  appointment to have this vaccine here.  Counseled on the influenza virus vaccine.  Vaccine information sheet given.  Influenza vaccine given after consent obtained.  Irvan was seen today for med check.  Diagnoses and all orders for this visit:  Dyslipidemia -     Lipid panel -     Comprehensive metabolic panel -     CBC  Impaired fasting glucose -     Hemoglobin A1c  Generalized anxiety disorder  Vaccine counseling  Bradycardia -     CBC -     EKG 12-Lead  Need for influenza vaccination   F/u pending labs

## 2018-07-19 ENCOUNTER — Other Ambulatory Visit: Payer: Self-pay | Admitting: Medical

## 2018-07-19 MED ORDER — ASPIRIN 81 MG PO TABS: 81.0000 mg | ORAL_TABLET | Freq: Every day | ORAL | 3 refills | Status: DC

## 2018-07-19 MED ORDER — SIMVASTATIN 10 MG PO TABS: 10.0000 mg | ORAL_TABLET | Freq: Every day | ORAL | 3 refills | Status: DC

## 2018-07-20 ENCOUNTER — Other Ambulatory Visit: Payer: Self-pay

## 2018-09-20 ENCOUNTER — Ambulatory Visit: Payer: BLUE CROSS/BLUE SHIELD | Admitting: Medical

## 2018-09-20 ENCOUNTER — Encounter: Payer: Self-pay | Admitting: Medical

## 2018-09-20 VITALS — BP 130/80 | HR 54 | Temp 97.7°F | Resp 16 | Ht 71.0 in | Wt 163.8 lb

## 2018-09-20 DIAGNOSIS — T148XXA Other injury of unspecified body region, initial encounter: Secondary | ICD-10-CM | POA: Diagnosis not present

## 2018-09-20 DIAGNOSIS — M19041 Primary osteoarthritis, right hand: Secondary | ICD-10-CM | POA: Diagnosis not present

## 2018-09-20 MED ORDER — MUPIROCIN 2 % EX OINT: 1.0000 "application " | TOPICAL_OINTMENT | Freq: Three times a day (TID) | CUTANEOUS | 0 refills | Status: DC

## 2018-09-20 NOTE — Progress Notes (Signed)
Subjective: Chief Complaint  Patient presents with  . right hand injury    right hand injury X 12 days   Here for right hand injury.   He notes he was putting on a fan in his car engine 12 days ago.   This was a tight squeeze in where radiator in car is located.   He was working in a tight space and rough up his hand against metal parts.    Was getting roughed up working on the fan.   Within hours started getting some swelling and pain of right hand, along index MCP.   He can bend fingers ok, but with pain.   No fever, no nausea.   Has seen some redness of hand.   No other aggravating or relieving factors. No other complaint.  Past Medical History:  Diagnosis Date  . Anxiety    Dr. Toy Care  . Diverticulosis of colon    sigmoid, per colonoscopy 2014, Dr. Benson Norway  . Dyslipidemia   . Hemorrhoids    internal and externa per 2014 colonoscopy  . Insomnia    uses Neurontin ( Dr. Toy Care)  . Normal cardiac stress test 12/2012   performed due to risk factors, abnormal EKG; Dr. Wynonia Lawman  . Panic attack   . Second hand smoke exposure    wife   Current Outpatient Medications on File Prior to Visit  Medication Sig Dispense Refill  . aspirin 81 MG tablet Take 1 tablet (81 mg total) by mouth daily. 90 tablet 3  . fish oil-omega-3 fatty acids 1000 MG capsule Take 2 g by mouth daily.      [name] gabapentin (NEURONTIN) 800 MG tablet Take 800 mg by mouth 3 (three) times daily.      [name] LORazepam (ATIVAN) 2 MG tablet Take 4 mg by mouth daily.      . Multiple Vitamin (MULTIVITAMIN) tablet Take 1 tablet by mouth daily.    . simvastatin (ZOCOR) 10 MG tablet Take 1 tablet (10 mg total) by mouth daily at 6 PM. 90 tablet 3   No current facility-administered medications on file prior to visit.    ROS as in subjective   Objective: BP 130/80   Pulse (!) 54   Temp 97.7 F (36.5 C) (Oral)   Resp 16   Ht 5\' 11"  (1.803 m)   Wt 163 lb 12.8 oz (74.3 kg)   SpO2 99%   BMI 22.85 kg/m    Gen: wd, wn, nad Skin: Right  dorsal second MCP with slight swelling and erythema of the skin, healing crusted scab approximately 8 mm diameter, otherwise no erythema, no swelling, no warmth otherwise. Mildly tender over the right second MCP superficially in the skin otherwise nontender, no obvious intra-articular swelling.  Finger range of motion normal.  Capillary refill seems slightly decreased in general for all extremities with otherwise hand is neurovascular intact    Assessment: Encounter Diagnoses  Name Primary?  . Skin abrasion Yes  . Inflammation of hand joint, right      Plan: Discussed symptoms and findings.  He possibly has reaction to neosporin.  I don't see signs of true infection.    discussed recommendations below  Patient Instructions  Your skin suggests some inflammation of the right 2nd finger knuckle, but possibly reaction to Neosporin   Stop Neosporin  Begin Mupirocin ointment instead twice daily for the next 5-7 days   Keep the hand clean with soap and water  Begin over the counter Aleve or Ibuprofen twice  daily for 4-5 days, then stop  Use your hand for daily activity as usual, but use gloves if you are cleaning the potty, or potentially getting dirty.  If worse redness, swelling or pain in the next few days, call back  I do not currently feels as though you have a significant infection, but more inflammation than anything.     Paul Klein was seen today for right hand injury.  Diagnoses and all orders for this visit:  Skin abrasion  Inflammation of hand joint, right  Other orders -     mupirocin ointment (BACTROBAN) 2 %; Apply 1 application topically 3 (three) times daily.

## 2018-09-20 NOTE — Patient Instructions (Addendum)
Your skin suggests some inflammation of the right 2nd finger knuckle, but possibly reaction to Neosporin   Stop Neosporin  Begin Mupirocin ointment instead twice daily for the next 5-7 days   Keep the hand clean with soap and water  Begin over the counter Aleve or Ibuprofen twice daily for 4-5 days, then stop  Use your hand for daily activity as usual, but use gloves if you are cleaning the potty, or potentially getting dirty.  If worse redness, swelling or pain in the next few days, call back  I do not currently feels as though you have a significant infection, but more inflammation than anything.

## 2019-02-06 ENCOUNTER — Ambulatory Visit (INDEPENDENT_AMBULATORY_CARE_PROVIDER_SITE_OTHER): Payer: Medicare Other | Admitting: Medical

## 2019-02-06 ENCOUNTER — Other Ambulatory Visit: Payer: Self-pay

## 2019-02-06 ENCOUNTER — Telehealth: Payer: Self-pay | Admitting: Medical

## 2019-02-06 ENCOUNTER — Encounter: Payer: Self-pay | Admitting: Medical

## 2019-02-06 VITALS — Ht 71.0 in | Wt 160.0 lb

## 2019-02-06 DIAGNOSIS — Z0101 Encounter for examination of eyes and vision with abnormal findings: Secondary | ICD-10-CM | POA: Diagnosis not present

## 2019-02-06 DIAGNOSIS — H538 Other visual disturbances: Secondary | ICD-10-CM | POA: Insufficient documentation

## 2019-02-06 NOTE — Telephone Encounter (Signed)
Send copy of my office note to [facility], and highlight the plan.   Ask them if he needs to be seen Thursday instead of waiting til Friday.

## 2019-02-06 NOTE — Progress Notes (Signed)
Subjective:     Patient ID: Paul Klein, male   DOB: 08-May-1956, 63 y.o.   MRN: [id number]  HPI Chief Complaint  Patient presents with  . blurry vision    blurry vision X x4 days ago   Concern today about vision changes.  He reports he was in usual state of health until the last 3 to 4 days.  His concern today is blurred vision or vision change.  He has been working on his shed doing some work to stand the deck to get ready to repayment.  He has been having some allergy problems in general 4 weeks, having some watery drainage.  He wonders if he got some sanding debris in his eye.  He also was in the direct sunlight and a lot of glare in his eyes.  He was using sunglasses just some of the time not all of the time.  So he was in bright light for hours in the last several days.  Otherwise he denies vision loss, no pus drainage, no swelling or redness around the or redness in the eye.  He was able to drive around yesterday and go to the store.  He has not seen an eye doctor in 25 years.  No other symptoms.  He denies chest pain, shortness of breath, weakness, numbness, tingling, fever.  No polyuria, no polydipsia, no facial rash, no sunburn.   In general he has not had pain until his wife put some allergy eyedrops in his eyes and that burned.  He did tentatively make an appointment with [facility] for Friday in 2 days.  Past Medical History:  Diagnosis Date  . Anxiety    Dr. Toy Care  . Diverticulosis of colon    sigmoid, per colonoscopy 2014, Dr. Benson Norway  . Dyslipidemia   . Hemorrhoids    internal and externa per 2014 colonoscopy  . Insomnia    uses Neurontin ( Dr. Toy Care)  . Normal cardiac stress test 12/2012   performed due to risk factors, abnormal EKG; Dr. Wynonia Lawman  . Panic attack   . Second hand smoke exposure    wife   Current Outpatient Medications on File Prior to Visit  Medication Sig Dispense Refill  . aspirin 81 MG tablet Take 1 tablet (81 mg total) by mouth daily. 90  tablet 3  . fish oil-omega-3 fatty acids 1000 MG capsule Take 2 g by mouth daily.      [name] gabapentin (NEURONTIN) 800 MG tablet Take 800 mg by mouth 3 (three) times daily.      [name] LORazepam (ATIVAN) 2 MG tablet Take 4 mg by mouth daily.      . Multiple Vitamin (MULTIVITAMIN) tablet Take 1 tablet by mouth daily.    . mupirocin ointment (BACTROBAN) 2 % Apply 1 application topically 3 (three) times daily. 22 g 0  . simvastatin (ZOCOR) 10 MG tablet Take 1 tablet (10 mg total) by mouth daily at 6 PM. 90 tablet 3   No current facility-administered medications on file prior to visit.     Review of Systems As in subjective    Objective:   Physical Exam  Ht 5\' 11"  (1.803 m)   Wt 160 lb (72.6 kg)   BMI 22.32 kg/m   General appearance: alert, no distress, WD/WN,  HEENT: normocephalic, sclerae anicteric, TMs pearly, nares patent, no discharge or erythema, pharynx normal Oral cavity: MMM, no lesions Neck: supple, no lymphadenopathy, no thyromegaly, no masses Visual fields full, EOMi, PERRLA.  No obvious deformity.  Fluorescein stain with no obvious abrasion or defect.   Visual acuity abnormal bilat, but worse in right eye    Assessment:     Encounter Diagnoses  Name Primary?  . Blurred vision Yes  . Vision exam with abnormal findings        Plan:     Discussed symptoms, exam findings, including abnormal bilat vision exam, worse in right.  No obvious abnormality on exam.  I suspect prolonged work in the bright sun without sung lases or protection may be the cause of his symptoms, photo keratitis.   Advised he stay indoors the next 48 hours until he sees his eye doctor on Friday, wear sunglasses on the way home today, and use eye rest the next 48 hours.  No tv, no working outside, just rest.    F/u with eye doctor as scheduled in 2 days  Anselm was seen today for blurry vision.  Diagnoses and all orders for this visit:  Blurred vision  Vision exam with abnormal findings

## 2019-02-07 NOTE — Telephone Encounter (Signed)
Done faxed notes to [facility]

## 2019-02-08 DIAGNOSIS — H2513 Age-related nuclear cataract, bilateral: Secondary | ICD-10-CM | POA: Diagnosis not present

## 2019-02-08 DIAGNOSIS — H04123 Dry eye syndrome of bilateral lacrimal glands: Secondary | ICD-10-CM | POA: Diagnosis not present

## 2019-07-24 ENCOUNTER — Encounter: Payer: Self-pay | Admitting: *Deleted

## 2019-09-03 ENCOUNTER — Telehealth: Payer: Self-pay

## 2019-09-03 MED ORDER — SIMVASTATIN 10 MG PO TABS: 10.0000 mg | ORAL_TABLET | Freq: Every day | ORAL | 1 refills | Status: DC

## 2019-09-03 NOTE — Telephone Encounter (Signed)
Pt. Called stating he needs a rill on his Simvastatin 10mg  and would like it sent in to [facility] on the corner of [address]. Pt. Last apt. 02/06/19

## 2019-10-15 ENCOUNTER — Other Ambulatory Visit: Payer: Self-pay

## 2019-10-15 ENCOUNTER — Ambulatory Visit (INDEPENDENT_AMBULATORY_CARE_PROVIDER_SITE_OTHER): Payer: Medicare HMO | Admitting: Medical

## 2019-10-15 ENCOUNTER — Encounter: Payer: Self-pay | Admitting: Medical

## 2019-10-15 VITALS — BP 130/80 | HR 62 | Temp 97.7°F | Ht 71.5 in | Wt 165.8 lb

## 2019-10-15 DIAGNOSIS — Z7722 Contact with and (suspected) exposure to environmental tobacco smoke (acute) (chronic): Secondary | ICD-10-CM | POA: Diagnosis not present

## 2019-10-15 DIAGNOSIS — K6289 Other specified diseases of anus and rectum: Secondary | ICD-10-CM

## 2019-10-15 DIAGNOSIS — Z125 Encounter for screening for malignant neoplasm of prostate: Secondary | ICD-10-CM | POA: Diagnosis not present

## 2019-10-15 DIAGNOSIS — R7301 Impaired fasting glucose: Secondary | ICD-10-CM | POA: Diagnosis not present

## 2019-10-15 DIAGNOSIS — F411 Generalized anxiety disorder: Secondary | ICD-10-CM | POA: Diagnosis not present

## 2019-10-15 DIAGNOSIS — Z7185 Encounter for immunization safety counseling: Secondary | ICD-10-CM

## 2019-10-15 DIAGNOSIS — Z0000 Encounter for general adult medical examination without abnormal findings: Secondary | ICD-10-CM | POA: Diagnosis not present

## 2019-10-15 DIAGNOSIS — R001 Bradycardia, unspecified: Secondary | ICD-10-CM

## 2019-10-15 DIAGNOSIS — B351 Tinea unguium: Secondary | ICD-10-CM | POA: Diagnosis not present

## 2019-10-15 DIAGNOSIS — E785 Hyperlipidemia, unspecified: Secondary | ICD-10-CM | POA: Diagnosis not present

## 2019-10-15 DIAGNOSIS — Z136 Encounter for screening for cardiovascular disorders: Secondary | ICD-10-CM | POA: Diagnosis not present

## 2019-10-15 DIAGNOSIS — Z7189 Other specified counseling: Secondary | ICD-10-CM

## 2019-10-15 DIAGNOSIS — K5900 Constipation, unspecified: Secondary | ICD-10-CM

## 2019-10-15 NOTE — Progress Notes (Addendum)
Subjective:    Paul Klein is a 64 y.o. male who presents for Preventative Services visit and chronic medical problems/med check visit.    Primary Care Provider Tysinger, Camelia Eng, PA-C here for primary care  Current Health Care Team:  Dentist, Dr. Donah Driver  Eye doctor  Dr. Carol Ada, [place] you may have received from other than [facility] providers in the past year (date may be approximate) None this year  Exercise Current exercise habits:  walking   Nutrition/Diet Current diet: on average, 3 meals per day  Depression Screen Depression screen [facility] 2/9 10/15/2019  Decreased Interest 0  Down, Depressed, Hopeless 0  PHQ - 2 Score 0  Altered sleeping -  Tired, decreased energy -  Change in appetite -  Feeling bad or failure about yourself  -  Trouble concentrating -  Moving slowly or fidgety/restless -  Suicidal thoughts -  PHQ-9 Score -  Difficult doing work/chores -    Activities of Daily Living Screen/Functional Status Survey Is the patient deaf or have difficulty hearing?: No Does the patient have difficulty seeing, even when wearing glasses/contacts?: No Does the patient have difficulty concentrating, remembering, or making decisions?: No Does the patient have difficulty walking or climbing stairs?: No Does the patient have difficulty dressing or bathing?: No Does the patient have difficulty doing errands alone such as visiting a doctor's office or shopping?: No  Can patient draw a clock face showing 3:15 oclock, yes  Fall Risk Screen Fall Risk  10/15/2019 08/31/2017 08/30/2016 08/03/2015  Falls in the past year? 0 No Yes No  Number falls in past yr: 0 - - -  Injury with Fall? 0 - No -    Gait Assessment: Normal gait observed - yes Advanced directives Does patient have a [place]? Yes Does patient have a Living Will? Yes  Past Medical History:  Diagnosis Date  . Anxiety    Dr. Toy Care  . Diverticulosis of colon    sigmoid, per  colonoscopy 2014, Dr. Benson Norway  . Dyslipidemia   . Hemorrhoids    internal and externa per 2014 colonoscopy  . Insomnia    uses Neurontin ( Dr. Toy Care)  . Normal cardiac stress test 12/2012   performed due to risk factors, abnormal EKG; Dr. Wynonia Lawman  . Panic attack   . Second hand smoke exposure    wife    Past Surgical History:  Procedure Laterality Date  . ANAL FISSURE REPAIR    . COLONOSCOPY  02/19/13   diverticulosis of sigmoid colon, external and internal hemorrhoids, Dr. Benson Norway, repeat in 10 years  . SKIN BIOPSY     face, back  . WISDOM TOOTH EXTRACTION      Social History   Socioeconomic History  . Marital status: Married    Spouse name: Not on file  . Number of children: Not on file  . Years of education: Not on file  . Highest education level: Not on file  Occupational History  . Not on file  Tobacco Use  . Smoking status: Passive Smoke Exposure - Never Smoker  . Smokeless tobacco: Never Used  Substance and Sexual Activity  . Alcohol use: No  . Drug use: No    Comment: marijuana in the past  . Sexual activity: Not on file  Other Topics Concern  . Not on file  Social History Narrative   Walks the dog, does calisthenics.  Retired 2016.  Was working in Acupuncturist at [employer]  [place], married, 1 child, 2 grandchildren. 1 grandchild lives in [place], other grandchild teenager, wants to spend time with his friends. 08/2017   Social Determinants of Health   Financial Resource Strain:   . Difficulty of Paying Living Expenses: Not on file  Food Insecurity:   . Worried About Charity fundraiser in the Last Year: Not on file  . Ran Out of Food in the Last Year: Not on file  Transportation Needs:   . Lack of Transportation (Medical): Not on file  . Lack of Transportation (Non-Medical): Not on file  Physical Activity:   . Days of Exercise per Week: Not on file  . Minutes of Exercise per Session: Not on file  Stress:   . Feeling of Stress : Not on file  Social Connections:   .  Frequency of Communication with Friends and Family: Not on file  . Frequency of Social Gatherings with Friends and Family: Not on file  . Attends Religious Services: Not on file  . Active Member of Clubs or Organizations: Not on file  . Attends Archivist Meetings: Not on file  . Marital Status: Not on file  Intimate Partner Violence:   . Fear of Current or Ex-Partner: Not on file  . Emotionally Abused: Not on file  . Physically Abused: Not on file  . Sexually Abused: Not on file    Family History  Problem Relation Age of Onset  . Heart disease Mother        BYPASS HEART SURGERY  . Depression Mother   . Mental illness Mother   . Cancer Mother        skin  . Dementia Mother   . Hypertension Father   . Benign prostatic hyperplasia Father   . Cancer Father        skin  . Heart disease Father 57       CABG  . Depression Sister   . Stroke Maternal Grandmother   . Diabetes Paternal Grandfather      Current Outpatient Medications:  .  aspirin 81 MG tablet, Take 1 tablet (81 mg total) by mouth daily., Disp: 90 tablet, Rfl: 3 .  fish oil-omega-3 fatty acids 1000 MG capsule, Take 2 g by mouth daily.  , Disp: , Rfl:  .  gabapentin (NEURONTIN) 800 MG tablet, Take 800 mg by mouth 3 (three) times daily.  , Disp: , Rfl:  .  LORazepam (ATIVAN) 2 MG tablet, Take 4 mg by mouth daily.  , Disp: , Rfl:  .  Multiple Vitamin (MULTIVITAMIN) tablet, Take 1 tablet by mouth daily., Disp: , Rfl:  .  simvastatin (ZOCOR) 10 MG tablet, Take 1 tablet (10 mg total) by mouth daily at 6 PM., Disp: 90 tablet, Rfl: 1 .  mupirocin ointment (BACTROBAN) 2 %, Apply 1 application topically 3 (three) times daily. (Patient not taking: Reported on 10/15/2019), Disp: 22 g, Rfl: 0  Allergies  Allergen Reactions  . Prozac [Fluoxetine Hcl]     SSRIs in general cause worsened depression and suicidal ideation    History reviewed: allergies, current medications, past family history, past medical history, past  social history, past surgical history and problem list  issues discussed: Been having some painful and itchy hemorrhoids the last few hemorrhoids.   occasional they may bleed on toilet paper. Has 2 BMs daily but does use some medication to help with constipated.     His yorkie died this past year unfortunately   Objective:  Biometrics BP 130/80   Pulse 62   Temp 97.7 F (36.5 C)   Ht 5' 11.5" (1.816 m)   Wt 165 lb 12.8 oz (75.2 kg)   BMI 22.80 kg/m   Wt Readings from Last 3 Encounters:  10/15/19 165 lb 12.8 oz (75.2 kg)  02/06/19 160 lb (72.6 kg)  09/20/18 163 lb 12.8 oz (74.3 kg)     Cognitive Testing  Alert? Yes  Normal Appearance?Yes  Oriented to person? Yes  Place? Yes   Time? Yes  Recall of three objects?  Yes  Can perform simple calculations? Yes  Displays appropriate judgment?Yes  Can read the correct time from a watch face?Yes  General appearance: alert, no distress, WD/WN, white male  Nutritional Status: Inadequate calore intake? no Loss of muscle mass? yes Loss of fat beneath skin? some Localized or general edema? no Diminished functional status? no  Other pertinent exam: HEENT: normocephalic, sclerae anicteric, TMs pearly, nares patent, no discharge or erythema, pharynx normal Oral cavity: MMM, no lesions Neck: supple, no lymphadenopathy, no thyromegaly, no masses, no bruits Heart: RRR, normal S1, S2, no murmurs Lungs: CTA bilaterally, no wheezes, rhonchi, or rales Abdomen: +bs, soft, non tender, non distended, no masses, no hepatomegaly, no splenomegaly Musculoskeletal: nontender, no swelling, no obvious deformity Extremities: no edema, no cyanosis, no clubbing Pulses: 2+ symmetric, upper and lower extremities, normal cap refill Neurological: alert, oriented x 3, CN2-12 intact, strength normal upper extremities and lower extremities, sensation normal throughout, DTRs 2+ throughout, no cerebellar signs, gait normal Psychiatric: normal affect,  behavior normal, pleasant  GU: normal male, circ, no lad, no hernia DRE: normal tone, no obvious hemorrhoids or fissure, prostate mildly enlarged  EKG indication physical, bradycardia Rate 50 bpm, PR 166 ms, QRS 88 ms, QTC 397 ms, axis 80 degrees, sinus bradycardia, no acute change compared to 2019 EKG  Assessment:   Encounter Diagnoses  Name Primary?  . Encounter for health maintenance examination in adult Yes  . Medicare annual wellness visit, initial   . Rectal discomfort   . Impaired fasting glucose   . Onychomycosis   . Second hand tobacco smoke exposure   . Dyslipidemia   . Generalized anxiety disorder   . Vaccine counseling   . Bradycardia   . Screening for heart disease   . Screening for prostate cancer   . Constipation, unspecified constipation type      Plan:   A preventative services visit was completed today.  During the course of the visit today, we discussed and counseled about appropriate screening and preventive services.  A health risk assessment was established today that included a review of current medications, allergies, social history, family history, medical and preventative health history, biometrics, and preventative screenings to identify potential safety concerns or impairments.  A personalized plan was printed today for your records and use.   Personalized health advice and education was given today to reduce health risks and promote self management and wellness.  Information regarding end of life planning was discussed today.  Conditions/risks identified: Constipation, rectal discomfort - c/t good fiber and water intake.   consider cortisone rectal cream, sitz baths, suppository of steroid prn.  F/u pending labs   Bradycardia - reviewed EKG.   Will try and see if there are more recent heart study records than 2014.  He thinks he had updated cardiac testing with in 2-3 years.   Anxiety, insomnia - doing ok in this regard  onychomycosis - consider  topical therapy  Impaired  fasting glucose - f/u pending labs  dyslipidemia - labs today, fasting  Consider bone density scan, counseled on weight bearing and aerobic exercise.   Acute problems discussed today: none  Recommendations:  I recommend a yearly ophthalmology/optometry visit for glaucoma screening and eye checkup  I recommended a yearly dental visit for hygiene and checkup  Advanced directives - discussed nature and purpose of Advanced Directives, encouraged them to complete them if they have not done so and/or encouraged them to get [name] a copy if they have done this already.   Referrals today: none  Immunizations: I recommended a yearly influenza vaccine, typically in September when the vaccine is usually available  counseled on need for pneumococcal, flu, covid and 2nd shingrix.  He just recently had shingrix 1.   Will plan for covid vaccine next by itself.    Jyron was seen today for annual exam.  Diagnoses and all orders for this visit:  Encounter for health maintenance examination in adult -     Comprehensive metabolic panel -     CBC with Differential/Platelet -     Lipid panel -     PSA -     Hemoglobin A1c -     EKG 12-Lead -     TSH  Medicare annual wellness visit, initial  Rectal discomfort  Impaired fasting glucose -     Hemoglobin A1c  Onychomycosis  Second hand tobacco smoke exposure  Dyslipidemia -     Lipid panel  Generalized anxiety disorder  Vaccine counseling  Bradycardia -     EKG 12-Lead -     TSH  Screening for heart disease -     EKG 12-Lead  Screening for prostate cancer -     PSA  Constipation, unspecified constipation type     Medicare Attestation A preventative services visit was completed today.  During the course of the visit the patient was educated and counseled about appropriate screening and preventive services.  A health risk assessment was established with the patient that included a review of  current medications, allergies, social history, family history, medical and preventative health history, biometrics, and preventative screenings to identify potential safety concerns or impairments.  A personalized plan was printed today for the patient's records and use.   Personalized health advice and education was given today to reduce health risks and promote self management and wellness.  Information regarding end of life planning was discussed today.  Dorothea Ogle, PA-C   10/15/2019

## 2019-10-16 ENCOUNTER — Other Ambulatory Visit: Payer: Self-pay | Admitting: Medical

## 2019-10-16 DIAGNOSIS — Z13820 Encounter for screening for osteoporosis: Secondary | ICD-10-CM

## 2019-10-16 DIAGNOSIS — M6250 Muscle wasting and atrophy, not elsewhere classified, unspecified site: Secondary | ICD-10-CM

## 2019-10-16 DIAGNOSIS — R54 Age-related physical debility: Secondary | ICD-10-CM

## 2019-10-16 DIAGNOSIS — E875 Hyperkalemia: Secondary | ICD-10-CM

## 2019-10-16 LAB — CBC WITH DIFFERENTIAL/PLATELET
Basophils Absolute: 0.1 10*3/uL (ref 0.0–0.2)
Basos: 1 %
EOS (ABSOLUTE): 0 10*3/uL (ref 0.0–0.4)
Eos: 1 %
Hematocrit: 45.3 % (ref 37.5–51.0)
Hemoglobin: 15 g/dL (ref 13.0–17.7)
Immature Grans (Abs): 0 10*3/uL (ref 0.0–0.1)
Immature Granulocytes: 0 %
Lymphocytes Absolute: 1.6 10*3/uL (ref 0.7–3.1)
Lymphs: 27 %
MCH: 30.5 pg (ref 26.6–33.0)
MCHC: 33.1 g/dL (ref 31.5–35.7)
MCV: 92 fL (ref 79–97)
Monocytes Absolute: 0.3 10*3/uL (ref 0.1–0.9)
Monocytes: 6 %
Neutrophils Absolute: 4 10*3/uL (ref 1.4–7.0)
Neutrophils: 65 %
Platelets: 268 10*3/uL (ref 150–450)
RBC: 4.92 x10E6/uL (ref 4.14–5.80)
RDW: 12.3 % (ref 11.6–15.4)
WBC: 6.1 10*3/uL (ref 3.4–10.8)

## 2019-10-16 LAB — LIPID PANEL
Chol/HDL Ratio: 2 ratio (ref 0.0–5.0)
Cholesterol, Total: 178 mg/dL (ref 100–199)
HDL: 91 mg/dL (ref >39)
LDL Chol Calc (NIH): 72 mg/dL (ref 0–99)
Triglycerides: 84 mg/dL (ref 0–149)
VLDL Cholesterol Cal: 15 mg/dL (ref 5–40)

## 2019-10-16 LAB — COMPREHENSIVE METABOLIC PANEL
ALT: 14 IU/L (ref 0–44)
AST: 17 IU/L (ref 0–40)
Albumin/Globulin Ratio: 2.5 — ABNORMAL HIGH (ref 1.2–2.2)
Albumin: 4.9 g/dL — ABNORMAL HIGH (ref 3.8–4.8)
Alkaline Phosphatase: 82 IU/L (ref 39–117)
BUN/Creatinine Ratio: 9 — ABNORMAL LOW (ref 10–24)
BUN: 9 mg/dL (ref 8–27)
Bilirubin Total: 0.6 mg/dL (ref 0.0–1.2)
CO2: 25 mmol/L (ref 20–29)
Calcium: 10.3 mg/dL — ABNORMAL HIGH (ref 8.6–10.2)
Chloride: 100 mmol/L (ref 96–106)
Creatinine, Ser: 0.96 mg/dL (ref 0.76–1.27)
GFR calc Af Amer: 95 mL/min/{1.73_m2} (ref >59)
GFR calc non Af Amer: 83 mL/min/{1.73_m2} (ref >59)
Globulin, Total: 2 g/dL (ref 1.5–4.5)
Glucose: 108 mg/dL — ABNORMAL HIGH (ref 65–99)
Potassium: 5.6 mmol/L — ABNORMAL HIGH (ref 3.5–5.2)
Sodium: 139 mmol/L (ref 134–144)
Total Protein: 6.9 g/dL (ref 6.0–8.5)

## 2019-10-16 LAB — TSH: TSH: 0.854 u[IU]/mL (ref 0.450–4.500)

## 2019-10-16 LAB — HEMOGLOBIN A1C
Est. average glucose Bld gHb Est-mCnc: 114 mg/dL
Hgb A1c MFr Bld: 5.6 % (ref 4.8–5.6)

## 2019-10-16 LAB — PSA: Prostate Specific Ag, Serum: 2.5 ng/mL (ref 0.0–4.0)

## 2019-10-16 MED ORDER — ASPIRIN EC 81 MG PO TBEC: 81.0000 mg | DELAYED_RELEASE_TABLET | Freq: Every day | ORAL | 3 refills | Status: DC

## 2019-10-16 MED ORDER — HYDROCORTISONE 2.5 % EX CREA: TOPICAL_CREAM | Freq: Two times a day (BID) | CUTANEOUS | 1 refills | Status: DC

## 2019-11-21 ENCOUNTER — Telehealth: Payer: Self-pay | Admitting: Medical

## 2019-11-21 DIAGNOSIS — R413 Other amnesia: Secondary | ICD-10-CM

## 2019-11-21 NOTE — Telephone Encounter (Signed)
Pts wife called and wanted to see if he could get a referral to see a Neurologist for some cognitive issues that he has been having. pts wife Paul Klein can be reached at [phone] if you have any questions.

## 2019-11-22 NOTE — Telephone Encounter (Signed)
I called the pts. Wife back and gave her all the information in your note, she stated that it was not a stroke, these issues having been going on for one year, she also stated that he didn't need a consult here because he was just here about 1 month ago to discuss these issues, they would just like a referral to neurology done.

## 2019-11-22 NOTE — Telephone Encounter (Signed)
He was here in 09/2019 for a physical.  I looked back through my notes.   If he mentioned memory concerns, I must have been under the impression that he wasn't significantly concerned about this as I didn't document a big discussion about this.  He did mention his pet Yorkie passed away recently which could certainly cause him to be down in mood or have some change in affect.   I expressed sympathy for his loss.  But I don't see in my notes a big discussion about memory.  He seems sharp on neurological exam and our general conversation.   Nevertheless, go ahead and refer to neurology at this point

## 2019-11-22 NOTE — Telephone Encounter (Signed)
If there are concerns about memory, memory loss, forgetfulness, having trouble with concentration, then we can certainly refer to neurology.  It would probably be helpful to do a consult first to make sure were not dealing with something else  If any recent signs of stroke such as acute change in mental status, confusion, slurred speech, one-sided weakness numbness or tingling, then call 911 if it is within the last 24 hours.  If not then consider consult first before we refer to rule out certain things

## 2019-11-25 NOTE — Telephone Encounter (Signed)
Forwarding message to you for referral to neurology

## 2019-11-25 NOTE — Telephone Encounter (Signed)
Patient has been informed that he has been referred to Neurology

## 2019-11-26 ENCOUNTER — Encounter: Payer: Self-pay | Admitting: Neurology

## 2019-12-23 ENCOUNTER — Other Ambulatory Visit: Payer: Self-pay

## 2019-12-23 ENCOUNTER — Encounter: Payer: Self-pay | Admitting: Family Medicine

## 2019-12-23 ENCOUNTER — Ambulatory Visit (INDEPENDENT_AMBULATORY_CARE_PROVIDER_SITE_OTHER): Payer: Medicare HMO | Admitting: Family Medicine

## 2019-12-23 ENCOUNTER — Ambulatory Visit
Admission: RE | Admit: 2019-12-23 | Discharge: 2019-12-23 | Disposition: A | Discharge status: Home / Self Care | Payer: Medicare HMO | Source: Ambulatory Visit | Attending: Family Medicine | Admitting: Family Medicine

## 2019-12-23 VITALS — BP 120/70 | HR 79 | Temp 97.2°F | Wt 162.0 lb

## 2019-12-23 DIAGNOSIS — R0989 Other specified symptoms and signs involving the circulatory and respiratory systems: Secondary | ICD-10-CM

## 2019-12-23 DIAGNOSIS — R1084 Generalized abdominal pain: Secondary | ICD-10-CM | POA: Diagnosis not present

## 2019-12-23 DIAGNOSIS — K6289 Other specified diseases of anus and rectum: Secondary | ICD-10-CM | POA: Diagnosis not present

## 2019-12-23 DIAGNOSIS — K5900 Constipation, unspecified: Secondary | ICD-10-CM

## 2019-12-23 DIAGNOSIS — R109 Unspecified abdominal pain: Secondary | ICD-10-CM | POA: Diagnosis not present

## 2019-12-23 MED ORDER — POLYETHYLENE GLYCOL 3350 17 GM/SCOOP PO POWD: 17.0000 g | Freq: Every day | ORAL | 1 refills | Status: DC

## 2019-12-23 NOTE — Progress Notes (Signed)
   Subjective:    Patient ID: Paul Klein, male    DOB: 12/27/1955, 64 y.o.   MRN: [id number]  HPI Chief Complaint  Patient presents with  . hemorrhoid    hemorrhoids- alittle pain- but otc cream that helps some   He is here with a 2 month complaint of mild and intermittent rectal pain and hard stools. He denies any bleeding.  No fever, chills, dizziness, chest pain, palpitations, shortness of breath, nausea or vomiting.    States he has tried OTC cream for rectum and this helps.  He also has steroid cream at home prescribed by his PCP.   Complains of generalized abdominal pain. Feels like he needs to have a bowel movement  States he took [employer] and this morning he had a dark stool.  States he has a history of constipation. Is not currently taking a medication to help soften stools.   Reviewed allergies, medications, past medical, surgical, family, and social history.    Review of Systems Pertinent positives and negatives in the history of present illness.     Objective:   Physical Exam Exam conducted with a chaperone present.  Constitutional:      General: He is not in acute distress.    Appearance: He is not ill-appearing.  Eyes:     Conjunctiva/sclera: Conjunctivae normal.  Cardiovascular:     Rate and Rhythm: Normal rate and regular rhythm.     Pulses: Normal pulses.  Pulmonary:     Effort: Pulmonary effort is normal.     Breath sounds: Normal breath sounds.  Abdominal:     General: Abdomen is flat. Bowel sounds are decreased. There is no distension or abdominal bruit.     Palpations: Abdomen is soft. There is no mass.     Tenderness: There is abdominal tenderness. There is no right CVA tenderness, left CVA tenderness, guarding or rebound. Negative signs include Murphy's sign, Rovsing's sign and McBurney's sign.     Comments: Prominent abdominal pulse  Genitourinary:    Rectum: Normal. Guaiac result negative.  Musculoskeletal:     Cervical back: Normal  range of motion and neck supple.  Skin:    General: Skin is warm and dry.  Neurological:     General: No focal deficit present.     Mental Status: He is alert.     Cranial Nerves: Cranial nerves are intact.     Sensory: Sensation is intact.    BP 120/70   Pulse 79   Temp (!) 97.2 F (36.2 C)   Wt 162 lb (73.5 kg)   BMI 22.28 kg/m       Assessment & Plan:  Rectal discomfort  Constipation, unspecified constipation type - Plan: DG Abd 1 View, polyethylene glycol powder (GLYCOLAX/MIRALAX) 17 GM/SCOOP powder  Generalized abdominal pain - Plan: DG Abd 1 View  Prominent abdominal aortic pulse - Plan: DG Abd 1 View  No obvious hemorrhoids or fissure. Negative guaiaic  No red flag symptoms.  Suspect rectal pain from hard stools and constipation. We discussed the need to have softer bowel movements and avoid straining.  I will prescribe Miralax to take once daily. May increase to twice daily if needed.  He is getting good relief with steroid cream and has this at home.  I will send him for a KUB to rule out obstruction and to evaluate a prominent abdominal pulse. This may be due to a thin body habitus.  Follow up pending result.

## 2019-12-23 NOTE — Patient Instructions (Signed)
Go to [facility] for the X ray of your abdomen.   Start taking Miralax (I sent this to your pharmacy) once daily in a full glass of water.  This is meant to help you keep your stools soft and prevent worsening rectal pain.   Follow up with Dorothea Ogle, your primary care provider if you are not improving in a couple of weeks.      Constipation, Adult Constipation is when a person:  Poops (has a bowel movement) fewer times in a week than normal.  Has a hard time pooping.  Has poop that is dry, hard, or bigger than normal. Follow these instructions at home: Eating and drinking   Eat foods that have a lot of fiber, such as: ? Fresh fruits and vegetables. ? Whole grains. ? Beans.  Eat less of foods that are high in fat, low in fiber, or overly processed, such as: ? [place] fries. ? Hamburgers. ? Cookies. ? Candy. ? Soda.  Drink enough fluid to keep your pee (urine) clear or pale yellow. General instructions  Exercise regularly or as told by your doctor.  Go to the restroom when you feel like you need to poop. Do not hold it in.  Take over-the-counter and prescription medicines only as told by your doctor. These include any fiber supplements.  Do pelvic floor retraining exercises, such as: ? Doing deep breathing while relaxing your lower belly (abdomen). ? Relaxing your pelvic floor while pooping.  Watch your condition for any changes.  Keep all follow-up visits as told by your doctor. This is important. Contact a doctor if:  You have pain that gets worse.  You have a fever.  You have not pooped for 4 days.  You throw up (vomit).  You are not hungry.  You lose weight.  You are bleeding from the anus.  You have thin, pencil-like poop (stool). Get help right away if:  You have a fever, and your symptoms suddenly get worse.  You leak poop or have blood in your poop.  Your belly feels hard or bigger than normal (is bloated).  You have very  bad belly pain.  You feel dizzy or you faint. This information is not intended to replace advice given to you by your health care provider. Make sure you discuss any questions you have with your health care provider. Document Revised: 08/25/2017 Document Reviewed: 03/02/2016 Elsevier Patient Education  2020 [employer].

## 2019-12-24 ENCOUNTER — Encounter: Payer: Self-pay | Admitting: Family Medicine

## 2020-02-28 ENCOUNTER — Ambulatory Visit: Payer: Medicare HMO | Admitting: Neurology

## 2020-02-28 ENCOUNTER — Other Ambulatory Visit: Payer: Self-pay

## 2020-02-28 ENCOUNTER — Other Ambulatory Visit: Payer: Medicare HMO

## 2020-02-28 ENCOUNTER — Encounter: Payer: Self-pay | Admitting: Neurology

## 2020-02-28 VITALS — BP 143/76 | HR 67 | Ht 71.5 in | Wt 152.6 lb

## 2020-02-28 DIAGNOSIS — R413 Other amnesia: Secondary | ICD-10-CM

## 2020-02-28 LAB — FOLATE: Folate: >24.8 ng/mL (ref >5.9)

## 2020-02-28 LAB — AMMONIA: Ammonia: 13 umol/L (ref 11–35)

## 2020-02-28 LAB — C-REACTIVE PROTEIN: CRP: <1 mg/dL (ref 0.5–20.0)

## 2020-02-28 LAB — VITAMIN B12: Vitamin B-12: 477 pg/mL (ref 211–911)

## 2020-02-28 LAB — SEDIMENTATION RATE: Sed Rate: 2 mm/hr (ref 0–20)

## 2020-02-28 NOTE — Patient Instructions (Signed)
1. Bloodwork for J88, B1, folic acid, RPR, ammonia, ESR, CRP, ANA, anti-thyroglobulin antibodies, anti-thyroid peroxidase antibodies  2. Schedule MRI brain with and without contrast  3. Schedule Neurocognitive testing  4. Follow-up after tests, call for any changes   RECOMMENDATIONS FOR ALL PATIENTS WITH MEMORY PROBLEMS: 1. Continue to exercise (Recommend 30 minutes of walking everyday, or 3 hours every week) 2. Increase social interactions - continue going to [place] and enjoy social gatherings with friends and family 3. Eat healthy, avoid fried foods and eat more fruits and vegetables 4. Maintain adequate blood pressure, blood sugar, and blood cholesterol level. Reducing the risk of stroke and cardiovascular disease also helps promoting better memory. 5. Avoid stressful situations. Live a simple life and avoid aggravations. Organize your time and prepare for the next day in anticipation. 6. Sleep well, avoid any interruptions of sleep and avoid any distractions in the bedroom that may interfere with adequate sleep quality 7. Avoid sugar, avoid sweets as there is a strong link between excessive sugar intake, diabetes, and cognitive impairment The Mediterranean diet has been shown to help patients reduce the risk of progressive memory disorders and reduces cardiovascular risk. This includes eating fish, eat fruits and green leafy vegetables, nuts like almonds and hazelnuts, walnuts, and also use olive oil. Avoid fast foods and fried foods as much as possible. Avoid sweets and sugar as sugar use has been linked to worsening of memory function.

## 2020-02-28 NOTE — Progress Notes (Signed)
NEUROLOGY CONSULTATION NOTE  Paul Klein MRN: [id number] DOB: November 02, 1955  Referring provider: Chana Bode, PA-C Primary care provider: Chana Bode, PA-C  Reason for consult:  Memory loss  Thank you for your kind referral of Paul Klein for consultation of the above symptoms. Although his history is well known to you, please allow me to reiterate it for the purpose of our medical record. The patient was accompanied to the clinic by his wife who also provides collateral information. Records and images were personally reviewed where available.   HISTORY OF PRESENT ILLNESS: This is a pleasant 64 year old right-handed man with a history of hyperlipidemia, anxiety, presenting for evaluation of memory loss. He feels his memory is not that good. He feels he started noticing changes after he stopped working 4 years ago, he went into early retirement to help take care of his mother with dementia (who has passed away). His wife started noticing changes a little under a year. She initially thought he was just not listening to her, but symptoms have progressively worsened. He repeats himself and would not remember conversations from 15 minutes prior. Around 4-5 months ago, he could not recall his computer password which he has had for 14 years. She had to write this down for him and he still forgot. He used to build computers in the past, but now she has to help him with the computer or with using the TV remote control. She took over finances 5 months ago, noticing that with online banking, there were 10 entries he put in that were already there. He manages his own medications without difficulties. He drives without getting lost, his wife denies any driving concerns. He has always had word-finding issues but this has worsened. His wife has noticed increased irritability, one night he threw the remote control 4 times because he got frustrated he could not get the TV to work. He gets irritated when she  tries to correct him. No paranoia or hallucinations. No REM behavior disorder. He has been on gabapentin and lorazepam 1mg  BID for many years for anxiety. He denies feeling anxious a lot, his wife states this is why he takes the medication. He did not recall why he is taking the gabapentin. His hands shake when he is nervous. He denies any headaches, dizziness, diplopia, dysarthria/dysphagia, neck/back pain, focal numbness/tingling/weakness, anosmia, no falls. Sleep is good. He feels his mood is "happy when at home." His mother was diagnosed with dementia at age 57. There are other maternal family members with dementia. No history of significant head injuries. He is not drinking alcohol, however his wife notes when their dog died in 09-03-2019, he was drinking for 3 months but his memory got really bad, so he stopped alcohol.    Laboratory Data: Lab Results  Component Value Date   TSH 0.854 10/15/2019     PAST MEDICAL HISTORY: Past Medical History:  Diagnosis Date  . Anxiety    Dr. Toy Care  . Diverticulosis of colon    sigmoid, per colonoscopy 2014, Dr. Benson Norway  . Dyslipidemia   . Hemorrhoids    internal and externa per 2014 colonoscopy  . Insomnia    uses Neurontin ( Dr. Toy Care)  . Normal cardiac stress test 12/2012   performed due to risk factors, abnormal EKG; Dr. Wynonia Lawman  . Panic attack   . Second hand smoke exposure    wife    PAST SURGICAL HISTORY: Past Surgical History:  Procedure Laterality Date  .  ANAL FISSURE REPAIR    . COLONOSCOPY  02/19/13   diverticulosis of sigmoid colon, external and internal hemorrhoids, Dr. Benson Norway, repeat in 10 years  . SKIN BIOPSY     face, back  . WISDOM TOOTH EXTRACTION      MEDICATIONS: Current Outpatient Medications on File Prior to Visit  Medication Sig Dispense Refill  . aspirin EC 81 MG tablet Take 1 tablet (81 mg total) by mouth daily. 90 tablet 3  . gabapentin (NEURONTIN) 800 MG tablet     . hydrocortisone 2.5 % cream Apply topically 2  (two) times daily. 30 g 1  . LORazepam (ATIVAN) 2 MG tablet Take 4 mg by mouth daily.      . Multiple Vitamin (MULTIVITAMIN) tablet Take 1 tablet by mouth daily.    . Omega-3 Fatty Acids (FISH OIL) 1000 MG CAPS Take by mouth.    . psyllium (METAMUCIL) 58.6 % packet Take 1 packet by mouth daily.    . simvastatin (ZOCOR) 10 MG tablet Take 1 tablet (10 mg total) by mouth daily at 6 PM. 90 tablet 1   No current facility-administered medications on file prior to visit.    ALLERGIES: Allergies  Allergen Reactions  . Prozac [Fluoxetine Hcl]     SSRIs in general cause worsened depression and suicidal ideation    FAMILY HISTORY: Family History  Problem Relation Age of Onset  . Heart disease Mother        BYPASS HEART SURGERY  . Depression Mother   . Mental illness Mother   . Cancer Mother        skin  . Dementia Mother   . Hypertension Father   . Benign prostatic hyperplasia Father   . Cancer Father        skin  . Heart disease Father 61       CABG  . Depression Sister   . Stroke Maternal Grandmother   . Diabetes Paternal Grandfather     SOCIAL HISTORY: Social History   Socioeconomic History  . Marital status: Married    Spouse name: Not on file  . Number of children: Not on file  . Years of education: Not on file  . Highest education level: Not on file  Occupational History  . Not on file  Tobacco Use  . Smoking status: Former Research scientist (life sciences)  . Smokeless tobacco: Never Used  Substance and Sexual Activity  . Alcohol use: No  . Drug use: No    Comment: marijuana in the past  . Sexual activity: Not on file  Other Topics Concern  . Not on file  Social History Narrative   Walks the dog, does calisthenics.  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 1 grandchild lives in [place], other grandchild teenager, wants to spend time with his friends. 08/2017   Right handed    Social Determinants of Health   Financial Resource  Strain:   . Difficulty of Paying Living Expenses:   Food Insecurity:   . Worried About Charity fundraiser in the Last Year:   . Arboriculturist in the Last Year:   Transportation Needs:   . Film/video editor (Medical):   [name] Lack of Transportation (Non-Medical):   Physical Activity:   . Days of Exercise per Week:   . Minutes of Exercise per Session:   Stress:   . Feeling of Stress :   Social Connections:   . Frequency of Communication  with Friends and Family:   . Frequency of Social Gatherings with Friends and Family:   . Attends Religious Services:   . Active Member of Clubs or Organizations:   . Attends Archivist Meetings:   [name] Marital Status:   Intimate Partner Violence:   . Fear of Current or Ex-Partner:   . Emotionally Abused:   [name] Physically Abused:   . Sexually Abused:     REVIEW OF SYSTEMS: Constitutional: No fevers, chills, or sweats, no generalized fatigue, change in appetite Eyes: No visual changes, double vision, eye pain Ear, nose and throat: No hearing loss, ear pain, nasal congestion, sore throat Cardiovascular: No chest pain, palpitations Respiratory:  No shortness of breath at rest or with exertion, wheezes GastrointestinaI: No nausea, vomiting, diarrhea, abdominal pain, fecal incontinence Genitourinary:  No dysuria, urinary retention or frequency Musculoskeletal:  No neck pain, back pain Integumentary: No rash, pruritus, skin lesions Neurological: as above Psychiatric: No depression, insomnia, +anxiety Endocrine: No palpitations, fatigue, diaphoresis, mood swings, change in appetite, change in weight, increased thirst Hematologic/Lymphatic:  No anemia, purpura, petechiae. Allergic/Immunologic: no itchy/runny eyes, nasal congestion, recent allergic reactions, rashes  PHYSICAL EXAM: Vitals:   02/28/20 1354  BP: (!) 143/76  Pulse: 67  SpO2: 99%   General: No acute distress, flat affect Head:  Normocephalic/atraumatic Skin/Extremities: No  rash, no edema Neurological Exam: Mental status: alert and oriented to person, place, and time, no dysarthria or aphasia, Fund of knowledge is appropriate.  Recent and remote memory are impaired. Attention and concentration are reduced. SLUMS score 13/30.  [place] Mental Exam 02/28/2020  Weekday Correct 1  Current year 1  What state are we in? 1  Amount spent 0  Amount left 0  # of Animals 1  5 objects recall 1  Number series 0  Hour markers 2  Time correct 0  Placed X in triangle correctly 1  Largest Figure 1  Name of male 2  Date back to work 0  Type of work 0  State she lived in 2  Total score 13    Cranial nerves: CN I: not tested CN II: pupils equal, round and reactive to light, visual fields intact CN III, IV, VI:  full range of motion, no nystagmus, no ptosis CN V: facial sensation intact CN VII: upper and lower face symmetric CN VIII: hearing intact to conversation Bulk & Tone: normal, no fasciculations. Motor: 5/5 throughout with no pronator drift. Sensation: intact to light touch, cold, vibration sense.  No extinction to double simultaneous stimulation.  Romberg test negative Deep Tendon Reflexes: +2 throughout, no ankle clonus Cerebellar: no incoordination on finger to nose testing Gait: narrow-based and steady, able to tandem walk adequately. Tremor: none  IMPRESSION: This is a pleasant 64 year old right-handed man with a history of hyperlipidemia, anxiety, presenting for evaluation of memory loss. His wife reports difficulties with complex tasks such as managing finances. His neurological exam is non-focal, SLUMS score today is 13/30. Etiology of memory loss unclear, would do bloodwork for treatable causes of memory loss. MRI brain with and without contrast will be ordered to assess for underlying structural abnormality. He will be scheduled for Neurocognitive testing to further evaluate memory concerns. We discussed how anxiety, mood can also cause  cognitive changes. Follow-up after tests, they know to call for any changes.   Thank you for allowing me to participate in the care of this patient. Please do not hesitate to call for any questions or concerns.  Ellouise Newer, M.D.  CC: Chana Bode, PA-C

## 2020-02-29 ENCOUNTER — Other Ambulatory Visit: Payer: Self-pay | Admitting: Medical

## 2020-03-03 LAB — RPR: RPR Ser Ql: NONREACTIVE

## 2020-03-03 LAB — VITAMIN B1: Vitamin B1 (Thiamine): 11 nmol/L (ref 8–30)

## 2020-03-03 LAB — THYROGLOBULIN LEVEL: Thyroglobulin: 9.5 ng/mL

## 2020-03-03 LAB — THYROID PEROXIDASE ANTIBODY: Thyroperoxidase Ab SerPl-aCnc: 2 IU/mL (ref <9)

## 2020-03-03 LAB — ANA: Anti Nuclear Antibody (ANA): NEGATIVE

## 2020-03-10 ENCOUNTER — Telehealth: Payer: Self-pay

## 2020-03-10 NOTE — Telephone Encounter (Signed)
Pt called no answer voice mail left for him to call back

## 2020-03-10 NOTE — Telephone Encounter (Signed)
Spoke with pt wife informed her that pts lab work was normal. Verbalized understanding no questions asked

## 2020-03-10 NOTE — Telephone Encounter (Signed)
-----   Message from Cameron Sprang, MD sent at 03/09/2020  4:27 PM EDT ----- Pls let patient know bloodwork was normal, thanks

## 2020-03-28 ENCOUNTER — Ambulatory Visit
Admission: RE | Admit: 2020-03-28 | Discharge: 2020-03-28 | Disposition: A | Discharge status: Home / Self Care | Payer: Medicare HMO | Source: Ambulatory Visit | Attending: Neurology | Admitting: Neurology

## 2020-03-28 DIAGNOSIS — R413 Other amnesia: Secondary | ICD-10-CM

## 2020-03-28 MED ORDER — GADOBENATE DIMEGLUMINE 529 MG/ML IV SOLN
15.0000 mL | Freq: Once | INTRAVENOUS | Status: AC | PRN
  Administered 2020-03-28: 15 mL via INTRAVENOUS

## 2020-03-31 ENCOUNTER — Telehealth: Payer: Self-pay

## 2020-03-31 NOTE — Telephone Encounter (Signed)
-----   Message from Cameron Sprang, MD sent at 03/31/2020  8:45 AM EDT ----- Pls let him know the MRI brain did not show any evidence of tumor, stroke or bleed. It showed age-related changes. The Neurocognitive testing scheduled this month would be helpful to further evaluate his memory changes. Thanks

## 2020-03-31 NOTE — Telephone Encounter (Signed)
Spoke to pt and his wife informed them that MRI brain did not show any evidence of tumor, stroke or bleed. It showed age-related changes. The Neurocognitive testing scheduled this month would be helpful to further evaluate his memory changes. Verbalized understanded ,

## 2020-04-10 ENCOUNTER — Ambulatory Visit: Payer: Medicare HMO

## 2020-04-10 ENCOUNTER — Other Ambulatory Visit: Payer: Self-pay

## 2020-04-10 ENCOUNTER — Encounter: Payer: Self-pay | Admitting: Counselor

## 2020-04-10 ENCOUNTER — Ambulatory Visit: Payer: Medicare HMO | Admitting: Counselor

## 2020-04-10 DIAGNOSIS — R413 Other amnesia: Secondary | ICD-10-CM

## 2020-04-10 DIAGNOSIS — F0390 Unspecified dementia without behavioral disturbance: Secondary | ICD-10-CM | POA: Diagnosis not present

## 2020-04-10 DIAGNOSIS — R481 Agnosia: Secondary | ICD-10-CM

## 2020-04-10 NOTE — Progress Notes (Signed)
   Psychometrist Note   Cognitive testing was administered to [employer] by Lamar Benes, B.S. (Technician) under the supervision of Alphonzo Severance, Psy.D., ABN. Mr. Granlund was able to tolerate all test procedures. Dr. Nicole Kindred met with the patient as needed to manage any emotional reactions to the testing procedures. Rest breaks were offered.    The battery of tests administered was selected by Dr. Nicole Kindred with consideration to the patient's current level of functioning, the nature of his symptoms, emotional and behavioral responses during the interview, level of literacy, observed level of motivation/effort, and the nature of the referral question. This battery was communicated to the psychometrist. Communication between Dr. Nicole Kindred and the psychometrist was ongoing throughout the evaluation and Dr. Nicole Kindred was immediately accessible at all times. Dr. Nicole Kindred provided supervision to the technician on the date of this service, to the extent necessary to assure the quality of all services provided.    Mr. Hinojos will return in approximately one week for an interactive feedback session with Dr. Nicole Kindred, at which time male test performance, clinical impressions, and treatment recommendations will be reviewed in detail. The patient understands he can contact our office should he require our assistance before this time.   A total of 80 minutes of billable time were spent with Mallory Shirk by the technician, including test administration and scoring time. Billing for these services is reflected in Dr. Les Pou note.   This note reflects time spent with the psychometrician and does not include test scores, clinical history, or any interpretations made by Dr. Nicole Kindred. The full report will follow in a separate note.

## 2020-04-10 NOTE — Progress Notes (Signed)
[facility]  Patient Name: Paul Klein MRN: [id number] Date of Birth: Dec 01, 1955 Age: 64 y.o. Education: 12 years  Referral Circumstances and Background Information  Paul Klein is a 64 y.o., right-hand dominant, married man with a history of memory and thinking problems and a strong family history of Alzheimer's disease. He presented to Dr. Delice Lesch in early June, 2020 and she noted progressively worsening cognitive problems involving memory loss, forgetting computer passwords he has had for 14 years, problems using the TV remote and computer (he used to [employer]), problems doing the finances, and increased word finding issues. He also has a history of anxiety. SLUMS at that appointment was 13/30, there was no Parkinsonism on neurological exam.   On interview, the patient said that he "never even thought about" memory and thinking problems before his wife brought them to his attention but he does appreciate some changes. His wife has noticed issues over the past 10 months at least, he will forget things and he also will get side tracked mid sentence and lose his train of thought. She stated the he will come up to her talking in mid sentence and then seems surprised she doesn't know what he is talking about. He also will repeat himself. He has a hard time keeping track of appointments and managing his schedule. It's not clear if he is losing the day. He does have a hard time using computers he doesn't remember the appropriate steps, for instance he cannot check his e-mail. He had always been very good with computers in the past. He has a hard time with the remote. They denied that he has had any problems with other skilled motor behaviors, such as writing, tying his shoes, or using silverware. He seems like he is "in a daze" a lot of the time. He gets frustrated with himself. With respect to mood, he gets sad sometimes because he knows there is something  wrong with him and he wants to know what it is, but he isn't persistently depressed. His energy is good. He sleeps well, although he is taking a high dose of Lorazepam (1mg  BID is what they reported today). He has been losing weight, he has lost 45lbs over the past 5 years. He doesn't feel hungry much of the time. With respect to changes in movement, they haven't noticed any falls, moving very slowly, changes in facial expressiveness, he does have a "no" direction head tremor but that is for years and his dad has it.   With respect to functioning, he doesn't drive much but his wife isn't concerned and they haven't noticed any significant problems with it. He hasn't had any accidents. He is still independent with respect to community use, but it sounds like he doesn't leave the house a lot, and he is dependent on lists. His wife stated that one day he went to the grocery store with a list of 4 things. He forgot to bring a pen, so he went all the way home to get a pen, suggesting poor problem solving. He has had some changes in his ability to do things in the house, for instance he used to work for an [employer] and he wasn't able to replace their thermostat or fix it. He is still able to mow the lawn. He doesn't cook or do many chores. He used to manage the finances, his wife manages them now, she took them over 6 months ago (he was having errors  balancing the checkbook). The patient doesn't cook. She feels like his judgment is fairly impaired and he isn't able to participate much in important decisions.   Past Medical History and Review of Relevant Studies   Patient Active Problem List   Diagnosis Date Noted  . Rectal discomfort 10/15/2019  . Constipation 10/15/2019  . Blurred vision 02/06/2019  . Bradycardia 07/18/2018  . Onychomycosis 08/30/2016  . Insomnia 08/03/2015  . Generalized anxiety disorder 08/03/2015  . Screening for prostate cancer 08/03/2015  . Need for influenza vaccination 08/03/2015    . Vaccine counseling 08/03/2015  . Second hand tobacco smoke exposure 07/31/2014  . Dyslipidemia 07/31/2014  . Impaired fasting glucose 07/31/2014   Review of Neuroimaging and Relevant Medical History: The patient has an MRI of brain that shows moderate, likely pathologic atrophy with a sharp appearance to the cerebral sulci. His volume loss is generalized. Mesial temporal lobe volume is abnormal for his age, around a Sheltens scale of 2 bilateral by visual inspection. The imaging is nonspecific but is concering to me for the presence of dementia.   Current Outpatient Medications  Medication Sig Dispense Refill  . aspirin EC 81 MG tablet Take 1 tablet (81 mg total) by mouth daily. 90 tablet 3  . gabapentin (NEURONTIN) 800 MG tablet     . hydrocortisone 2.5 % cream Apply topically 2 (two) times daily. 30 g 1  . LORazepam (ATIVAN) 2 MG tablet Take 4 mg by mouth daily.      . Multiple Vitamin (MULTIVITAMIN) tablet Take 1 tablet by mouth daily.    . Omega-3 Fatty Acids (FISH OIL) 1000 MG CAPS Take by mouth.    . psyllium (METAMUCIL) 58.6 % packet Take 1 packet by mouth daily.    . simvastatin (ZOCOR) 10 MG tablet TAKE 1 TABLET(10 MG) BY MOUTH DAILY AT 6 PM 90 tablet 1   No current facility-administered medications for this visit.    Family History  Problem Relation Age of Onset  . Heart disease Mother        BYPASS HEART SURGERY  . Depression Mother   . Mental illness Mother   . Cancer Mother        skin  . Dementia Mother   . Hypertension Father   . Benign prostatic hyperplasia Father   . Cancer Father        skin  . Heart disease Father 37       CABG  . Depression Sister   . Stroke Maternal Grandmother   . Diabetes Paternal Grandfather    There is a family history of dementia. His mother developed the condition in her late 36s. There was a half sister on that side of the family who had "Alzheimer's and Parkinson's." There is no  family history of psychiatric  illness.  Psychosocial History  Developmental, Educational and Employment History: The patient reported a normal childhood and denied any history of abuse or neglect. He stated that he wasn't very engaged in school but he "passed," he wasn't sure what grades he got. He was more focused on sports and girls although he denied being held back or having any frank learning problems. He worked in Architectural technologist, he was a Curator, an Programmer, applications, and also [employer]. He never had his own company. He last worked at [employer], part time, and he retired about 4 years ago. He stopped working full time at 46.    Psychiatric History: The patient has a history of anxiety,  he consulted with a psychiatrist around 2012-2013, it sounds like there were significant problems at work and he was having a hard time dealing with them. His wife worked with him and reported it was a "toxic" environment. He was also depressed at that time. He started with medications at that time, prescribed by Dr. Toy Care, a Psychiatrist in private practice. He goes every 9 months for medication management. He has never been involved in therapy.   Substance Use History: The patient denied any illicit substance use although he enjoys drinking alcohol. He has cut down because it makes his memory worse. He used to drink in the past but went "years" without having a beer, and then started drinking again when his dog died. He will have from 2 - 6 beers when he drinks. He has "no sense of time" and sometimes will open a beer at 9:00am in the morning. He uses nicorette gum; he used to smoke but quit many years ago (at least 50).    Relationship History and Living Cimcumstances: The patient and his wife have been married for 35 years. His wife has a child from a previous marriage, although he adopted her when she was 33.   Mental Status and Behavioral Observations  Sensorium/Arousal: The patient's level of arousal was awake  and alert. Hearing and vision were adequate for testing purposes. Orientation: The patient was oriented to person, place, and to some extent time (off on the season and day but knew the month and year), and situation.  Appearance: Slender appearing elderly man dressed in appropriate, casual clothing.  Behavior: The patient presented as significantly impaired and got confused easily. Nevertheless he was pleasant and appropriate.  Speech/language: Normal in rate, rhythm, volume, and prosody without significant word finding or paraphasic errors.  Gait/Posture: Normal on exam with Dr. Delice Lesch Movement: The patient had a head tremor that emerged half way through the interview. He had no clear rest tremor, bradykinesia, or hypokinesia.  Social Comportment: Pleasant and appropriate Mood: "Good" Affect: Mainly euthymic, perhaps some underlying anxiety about cognitive tests Thought process/content: The patient appeared to have a hard time directing his thought process. He often lost his train of thought and also had some difficulties recalling aspects of his remote history (wasn't sure what he did for work until his wife helped). His thought process was not frankly tangential but he did require redirection.  Safety: No thoughts of harming self or others identified at today's encounter.  Insight: Unclear, patient seems aware that he has cognitive problems but lacking in appreciation  Cortical Motor/Sensory Functioning: Praxis: The patient's upper limb praxis was assessed for hammering, screw driver use, scrambling, and scissor use bilaterally and was mildly impaired, interestingly his hammering and screw driver use were better on the right. He had content errors on scrambling bilaterally (pantomimed eating instead). Scissor use was intact with prompting. There was no seeking behavior and I believe most of his errors were related to general cognitive status as opposed to apraxia per se.  Finger Gnosis: The  patient had significant bilateral finger agnosia with 1/4 on the right and 0/4 on the left.   Able to write the letters of the alphabet, had difficulty writing words.   MMSE - Mini Mental State Exam 04/10/2020  Orientation to time 3  Orientation to Place 4  Registration 3  Attention/ Calculation 1  Recall 0  Language- name 2 objects 2  Language- repeat 1  Language- follow 3 step command 2  Language- read &  follow direction 0  Write a sentence 1  Copy design 1  Total score 18   Test Procedures  Wide Range Achievement Test - 4             Word Reading Neuropsychological Assessment Battery  List Learning  Naming  Digit Span Repeatable Battery for the Assessment of Neuropsychological Status (Form A)  Figure Copy  Judgment of Line Orientation  Coding  Figure Recall Controlled Oral Word Association (F-A-S) Semantic Fluency (Animals) Trail Making Test A & B Complex Ideational Material Geriatric Depression Scale - Short Form Quick Dementia Rating System (completed by wife, Diane)  Plan  KENICHI CASSADA was seen for a psychiatric diagnostic evaluation and neuropsychological testing. He is a very plesaant 64 year old, right-hand dominant man with a history of memory and thinking problems over the past year. He is having functional difficulties including problems using computers, managing the finances, and keeping track of his schedule. He obtained an MMSE of 18 today and has a pathological amount of atrophy on his MRI, which is generalized. I am most concerned about neurodegenerative causes of cognitive decline, early onset, given that he is presenting at a moderate severity level. Full and complete note with impressions, recommendations, and interpretation of test data to follow.   Viviano Simas Nicole Kindred, PsyD, [place] Clinical Neuropsychologist  Informed Consent and Coding/Compliance  Risks and benefits of the evaluation were discussed with the patient prior to all testing procedures. I  conducted a clinical interview and neuropsychological testing (at least two tests) with Paul Klein and Paul Klein, B.S. (Technician) assisted me in administering additional test procedures. The patient was able to tolerate the testing procedures and the patient (and/or family if applicable) is likely to benefit from further follow up to receive the diagnosis and treatment recommendations, which will be rendered at the next encounter. Billing below reflects technician time, my direct face-to-face time with the patient, time spent in test administration, and time spent in professional activities including but not limited to: neuropsychological test interpretation, integration of neuropsychological test data with clinical history, report preparation, treatment planning, care coordination, and review of diagnostically pertinent medical history or studies.   Services associated with this encounter: Clinical Interview [phone]) plus 60 minutes (80165; Neuropsychological Evaluation by Professional)  110 minutes (53748; Neuropsychological Evaluation by Professional, Adl.) 17 minutes (27078; Test Administration by Professional) 30 minutes (67544; Neuropsychological Testing by Technician) 50 minutes (92010; Neuropsychological Testing by Technician, Adl.)

## 2020-04-14 NOTE — Progress Notes (Signed)
[facility]  Patient Name: GRIFFEY NICASIO MRN: [id number] Date of Birth: 05/13/1956 Age: 64 y.o. Education: 12 years  Measurement properties of test scores: IQ, Index, and Standard Scores (SS): Mean = 100; Standard Deviation = 15 Scaled Scores (Ss): Mean = 10; Standard Deviation = 3 Z scores (Z): Mean = 0; Standard Deviation = 1 T scores (T); Mean = 50; Standard Deviation = 10  TEST SCORES:    Note: This summary of test scores accompanies the interpretive report and should not be interpreted by unqualified individuals or in isolation without reference to the report. Test scores are relative to age, gender, and educational history as available and appropriate.   Mental Status Screening     Total Score Descriptor  MMSE 18 Moderate Dementia      Expected Functioning        Wide Range Achievement Test: Standard/Scaled Score Percentile      Word Reading 86 18      Attention/Processing Speed        Neuropsychological Assessment Battery (Attention Module, Form 1): T-score Percentile      Digits Forward 35 7      Digits Backwards 26 1      Repeatable Battery for the Assessment of Neuropsychological Status (Form A): Scaled Score Percentile      Coding 1 <1      Language        Neuropsychological Assessment Battery (Language Module, Form 1): T-score Percentile      Naming   (21) 19 <1      Verbal Fluency: T-score Percentile      Controlled Oral Word Association (F-A-S) 19 <1      Semantic Fluency (Animals) 15 <1      Memory:        Neuropsychological Assessment Battery (Memory Module, Form 1): T-score Percentile      List Learning           List A Immediate Recall   (3, 3, 4) 28 2         List B Immediate Recall   (1) 31 3         List A Short Delayed Recall   (0) 24 <1         List A Long Delayed Recall   (0) 30 2         List A Percent Retention   (0 %) --- <1         List A Long Delayed Yes/No Recognition Hits   (4) --- <1          List A Long Delayed Yes/No Recognition False Alarms   (10) --- 2         List A Recognition Discriminability Index --- <1      Repeatable Battery for the Assessment of Neuropsychological Status (Form A): Scaled Score Percentile         Figure Recall   (0) 1 <1      Visuospatial/Constructional Functioning        Repeatable Battery for the Assessment of Neuropsychological Status (Form A): Standard/Scaled Score Percentile     Visuospatial/Constructional Index 84 14         Figure Copy   (19) 11 63         Judgment of Line Orientation   (9) --- <2      Executive Functioning        Trail Making Test: T-Score Percentile  Part A 17 <1      Part B 17 <1      [place] Diagnostic Aphasia Exam: Raw Score Scaled Score      Complex Ideational Material 4 2      Clock Drawing Raw Score Descriptor      Command 7 Mild Impairment      Rating Scales        Clinical Dementia Rating Raw Score Descriptor      Sum of Boxes 8.0 Mild Dementia      Global Score 1.0 Mild Dementia      Quick Dementia Rating System Raw Score Descriptor      Sum of Boxes 6.5 Mild Dementia      Total Score 12 Mild Dementia  Geriatric Depression Scale - Short Form 2 Negative   Brendy Ficek V. Nicole Kindred PsyD, [place] Clinical Neuropsychologist

## 2020-04-15 ENCOUNTER — Ambulatory Visit (INDEPENDENT_AMBULATORY_CARE_PROVIDER_SITE_OTHER): Payer: Medicare HMO | Admitting: Medical

## 2020-04-15 ENCOUNTER — Other Ambulatory Visit: Payer: Self-pay

## 2020-04-15 ENCOUNTER — Encounter: Payer: Self-pay | Admitting: Medical

## 2020-04-15 VITALS — BP 170/80 | HR 58 | Temp 98.1°F | Ht 71.5 in | Wt 150.2 lb

## 2020-04-15 DIAGNOSIS — E878 Other disorders of electrolyte and fluid balance, not elsewhere classified: Secondary | ICD-10-CM

## 2020-04-15 DIAGNOSIS — R109 Unspecified abdominal pain: Secondary | ICD-10-CM | POA: Insufficient documentation

## 2020-04-15 DIAGNOSIS — R7309 Other abnormal glucose: Secondary | ICD-10-CM | POA: Diagnosis not present

## 2020-04-15 DIAGNOSIS — F0390 Unspecified dementia without behavioral disturbance: Secondary | ICD-10-CM

## 2020-04-15 DIAGNOSIS — R030 Elevated blood-pressure reading, without diagnosis of hypertension: Secondary | ICD-10-CM | POA: Diagnosis not present

## 2020-04-15 DIAGNOSIS — R350 Frequency of micturition: Secondary | ICD-10-CM

## 2020-04-15 LAB — POCT URINALYSIS DIP (PROADVANTAGE DEVICE)
Bilirubin, UA: NEGATIVE
Blood, UA: NEGATIVE
Glucose, UA: NEGATIVE mg/dL
Leukocytes, UA: NEGATIVE
Nitrite, UA: NEGATIVE
Protein Ur, POC: NEGATIVE mg/dL
Specific Gravity, Urine: 1.015
Urobilinogen, Ur: NEGATIVE
pH, UA: 7 (ref 5.0–8.0)

## 2020-04-15 MED ORDER — MIRABEGRON ER 25 MG PO TB24: 25.0000 mg | ORAL_TABLET | Freq: Every day | ORAL | 0 refills | Status: DC

## 2020-04-15 NOTE — Progress Notes (Signed)
Subjective:  Paul Klein is a 64 y.o. male who presents for Chief Complaint  Patient presents with  . Urinary Frequency    patient is having cognitive problems     Here with wife accompanying him.    He has been having increased urination. Worse if drinking some beers.  No burning, no blood in urine.  No cloudy urine, no odor.   Has been worse in recent months.  He thinks he may urinate 20 times per day.   He notes some occasional abdominal discomfort he associates with the urine.  Bowel movements are regular.  Sometimes feels bloated though.  No fever, no trauma to the abdomen.  No recent strenuous exercise.  no incontinence.   He notes that he drinks on average 3 beers per day.  Denies heavy caffeine or sugary drink use otherwise.  He has been seeing neurology for consult regarding memory loss and dementia.  He had a comprehensive evaluation recently and they follow-up with neurology in 1 week.  otherwise has been in usual state of health  No other aggravating or relieving factors.    No other c/o.  The following portions of the patient's history were reviewed and updated as appropriate: allergies, current medications, past family history, past medical history, past social history, past surgical history and problem list.  ROS Otherwise as in subjective above    Objective: BP (!) 170/80   Pulse (!) 58   Temp 98.1 F (36.7 C)   Ht 5' 11.5" (1.816 m)   Wt 150 lb 3.2 oz (68.1 kg)   SpO2 98%   BMI 20.66 kg/m   Wt Readings from Last 3 Encounters:  04/15/20 150 lb 3.2 oz (68.1 kg)  02/28/20 152 lb 9.6 oz (69.2 kg)  12/23/19 162 lb (73.5 kg)   BP Readings from Last 3 Encounters:  04/15/20 (!) 170/80  02/28/20 (!) 143/76  12/23/19 120/70    General appearance: alert, no distress, well developed, well nourished Abdomen: +bs, soft, non tender, non distended, no masses, no hepatomegaly, no splenomegaly Back: no CVA tenderness Pulses: 2+ radial pulses, 2+ pedal pulses,  normal cap refill Ext: no edema    Assessment: Encounter Diagnoses  Name Primary?  . Frequent urination Yes  . Abdominal discomfort   . Dementia without behavioral disturbance, unspecified dementia type ([facility])   . Elevated glucose   . Electrolyte abnormality   . Elevated blood-pressure reading without diagnosis of hypertension      Plan: Frequent urination-we discussed possible differential, he had elevated glucose 6 months ago but diabetes screenings otherwise are normal.  Urine culture sent today.  Possibly more likely diagnosis is overactive bladder.  I advise he cut down on alcohol and then try to stop drinking alcohol altogether particular given the urination issue and dementia new diagnosis  I gave him a sample of Myrbetriq to try assuming the urine culture comes back normal.  He seems quite irritated by the frequent urination wants to try something.  We discussed the risk and benefits of medication.  Discussed importance of hydration, cutting out sugary and alcoholic beverages  Elevated glucose, electrolytes abnormal on labs 6 months ago-I will see him back in 3 weeks for recheck on these issues.  Plan to check comprehensive metabolic or basic metabolic panel next visit.  Dementia-reviewed recent neurology consult notes.  he will follow up to discuss his recent comprehensive testing  Elevated blood pressure - recheck 3 wk  [place] was seen today for urinary frequency.  Diagnoses and all orders for this visit:  Frequent urination -     POCT Urinalysis DIP (Proadvantage Device) -     Urine Culture  Abdominal discomfort -     Urine Culture  Dementia without behavioral disturbance, unspecified dementia type ([facility])  Elevated glucose  Electrolyte abnormality  Elevated blood-pressure reading without diagnosis of hypertension  Other orders -     mirabegron ER (MYRBETRIQ) 25 MG TB24 tablet; Take 1 tablet (25 mg total) by mouth daily.    Follow up: pending culture, 3  weeks

## 2020-04-16 ENCOUNTER — Ambulatory Visit: Payer: Medicare HMO | Admitting: Medical

## 2020-04-16 LAB — URINE CULTURE: Organism ID, Bacteria: NO GROWTH

## 2020-04-17 ENCOUNTER — Other Ambulatory Visit: Payer: Self-pay

## 2020-04-17 ENCOUNTER — Encounter: Payer: Self-pay | Admitting: Counselor

## 2020-04-17 ENCOUNTER — Ambulatory Visit (INDEPENDENT_AMBULATORY_CARE_PROVIDER_SITE_OTHER): Payer: Medicare HMO | Admitting: Counselor

## 2020-04-17 DIAGNOSIS — G300 Alzheimer's disease with early onset: Secondary | ICD-10-CM

## 2020-04-17 DIAGNOSIS — F0280 Dementia in other diseases classified elsewhere without behavioral disturbance: Secondary | ICD-10-CM | POA: Diagnosis not present

## 2020-04-17 NOTE — Progress Notes (Signed)
[facility]  Patient Name: Paul Klein MRN: [id number] Date of Birth: 1955/10/07 Age: 64 y.o. Education: 12 years  Clinical Impressions  Paul Klein is a 64 y.o., right-hand dominant, married man with a history of memory and thinking problems who was seen by Dr. Delice Lesch in June, achieved a 13/30 on the SLUMS, and noted a nonfocal exam. He was referred for testing in the service of diagnostic clarification. He and his wife report memory loss, losing his train of thought, and difficulties using electronics with significant functional impairment. His wife took over the finances around the beginning of 2021. His MRI shows a likely pathological burden of volume loss, perhaps more in the mesial temporal areas, which measure a 2 bilaterally on the Sheltens' scale as per visual rating, which is abnormal for his age.   Neuropsychological testing shows clear memory storage problems, naming problems, and impaired verbal fluency. He also had extremely low scores on measures of processing speed and many tests of executive function. Visuospatial construction was intact but his judgment of angular line orientations was extremely low suggesting problems in visual perceptual abilities. He has some parietal signs and symptoms with significant finger agnosia bilaterally and mild dyspraxia. He screened negative for the presence of depression, was rated as functioning at a mild dementia level by his wife, and his global CDR score is 1.0, which is mild dementia.   Paul Klein presentation suggests a late mild level dementia that is most likely due to Alzheimer's disease. Given his level of progression, the condition is likely early onset and has been progressing for some time, although he started having more functional problems as of late. Would recommend starting a cholinesterase inhibitor as deemed medically appropriate. He and his wife are not worried about driving but that should  be carefully monitored given his late mild level of severity and some visuospatial problems. He is in a good situation at home. I would recommend that his wife accompany him to all medical appointments and continue to make medical and financial decisions of high risk on his behalf.   Diagnostic Impressions: Alzheimer's dementia, unspecified onset, without behavioral disturbance  Recommendations to be discussed with patient  Your performance and presentation today were consistent with a mild dementia level problem affecting memory abilities, executive functions, working memory/attention, and speed of processing. There were some additional low scores on measures of attention and visuospatial functioning. Given your level of difficulties with day-to-day functioning and test performance, I think that mild dementia is the appropriate term for your level of cognitive difficulty.   Dementia refers to a group of syndromes where multiple areas of ability are damaged in the brain, such as memory, thinking, judgment, and behavior, and most commonly refers to age related causes of dementia that cause worsening in these abilities over time. Alzheimer's disease is the most common form of dementia in people over the age of 10. Not all dementias are Alzheimer's disease, but all Alzheimer's disease is dementia. When dementia is due to an underlying condition affecting the brain, such as Alzheimer's disease, there is progression over time, which typically procedes gradually over many years.   In your case, I believe that your dementia is due to Alzheimer's disease. Alzheimer's is a brain disease that starts out as much as a decade before individuals manifest a clinical dementia syndrome. Given your level of progression and cognitive presentation, my sense is that this problem has probably been simmering in the background for some time, and  that may make it early onset (while somewhat arbitrary, dementia that begins before  the age of 39 is considered early onset).   Dr. Delice Lesch is already doing a thorough workup for reversible causes of dementia. You already have neuroimaging. From here, I would recommend that you implement lifestyle changes that can be effective at contributing to cognitive longevity. This generally includes dietary changes and exercise.   Dr. Delice Lesch could also consider starting a cholinesterase inhibitor.   I would recommend that your prescribing providers minimize the use of brain impairing medication. You are taking Lorazepam and Gabapentin but report that you really have no anxiety. You may wish to discuss the need for these medications with your prescribing providers.   There is now good quality evidence from at least one large scale study that a modified mediterranean diet may help slow cognitive decline. This is known as the "MIND" diet. The Mind diet is not so much a specific diet as it is a set of recommendations for things that you should and should not eat.   Foods that are ENCOURAGED on the MIND Diet:  Green, leafy vegetables: Aim for six or more servings per week. This includes kale, spinach, cooked greens and salads.  All other vegetables: Try to eat another vegetable in addition to the green leafy vegetables at least once a day. It is best to choose non-starchy vegetables because they have a lot of nutrients with a low number of calories.  Berries: Eat berries at least twice a week. There is a plethora of research on strawberries, and other berries such as blueberries, raspberries and blackberries have also been found to have antioxidant and brain health benefits.  Nuts: Try to get five servings of nuts or more each week. The creators of the [place] don't specify what kind of nuts to consume, but it is probably best to vary the type of nuts you eat to obtain a variety of nutrients. Peanuts are a legume and do not fall into this category.  Olive oil: Use olive oil as your main cooking  oil. There may be other heart-healthy alternatives such as algae oil, though there is not yet sufficient research upon which to base a formal recommendation.  Whole grains: Aim for at least three servings daily. Choose minimally processed grains like oatmeal, quinoa, brown rice, whole-wheat pasta and 100% whole-wheat bread.  Fish: Eat fish at least once a week. It is best to choose fatty fish like salmon, sardines, trout, tuna and mackerel for their high amounts of omega-3 fatty acids.  Beans: Include beans in at least four meals every week. This includes all beans, lentils and soybeans.  Poultry: Try to eat chicken or [place] at least twice a week. Note that fried chicken is not encouraged on the MIND diet.  Wine: Aim for no more than one glass of alcohol daily. Both red and white wine may benefit the brain. However, much research has focused on the red wine compound resveratrol, which may help protect against Alzheimer's disease.  Foods that are DISCOURAGED on the MIND Diet: Butter and margarine: Try to eat less than 1 tablespoon (about 14 grams) daily. Instead, try using olive oil as your primary cooking fat, and dipping your bread in olive oil with herbs.  Cheese: The MIND diet recommends limiting your cheese consumption to less than once per week.  Red meat: Aim for no more than three servings each week. This includes all beef, pork, lamb and products made from these meats.  [place]ace[place] Pro food: The MIND diet highly discourages fried food, especially the kind from fast-food restaurants. Limit your consumption to less than once per week.  Pastries and sweets: This includes most of the processed junk food and desserts you can think of. Ice cream, cookies, brownies, snack cakes, donuts, candy and more. Try to limit these to no more than four times a week.  Exercise is one of the best medicines for promoting health and maintaining cognitive fitness at all stages in life. Exercise probably has the largest  documented effect on brain health and performance of any intervention. Studies have shown that even previously sedentary individuals who start exercising as late as age 72 show a significant survival benefit as compared to their non-exercising peers. In the [place], the current guidelines are for 30 minutes of moderate exercise per day, but increasing your activity level less than that may also be helpful. You do not have to get your 30 minutes of exercise in one shot and exercising for short periods of time spread throughout the day can be helpful. Go for several walks, learn to dance, or do something else you enjoy that gets your body moving. Of course, if you have an underlying medical condition or there is any question about whether it is safe for you to exercise, you should consult a medical treatment provider prior to beginning exercise.   Test Findings  Test scores are summarized in additional documentation associated with this encounter. Test scores are relative to age, gender, and educational history as available and appropriate. There were no concerns about performance validity as all findings fell within normal expectations.   General Intellectual Functioning/Achievement:  Performance on single word reading fell at a low average level, which presents as a reasonable standard of comparison for the patient's cognitive test data.   Attention and Processing Efficiency: Performance on indicators of attention and working memory was low with unusually low to extremely low scores on measures of digit repetition forward and backward, respectively.   With respect to processing speed, Paul Klein visuospatial difficulties may have undermined his performance when scanning. His scores were extremely low both for timed number-symbol coding and for simple alternating sequencing (the latter of which is a very easy task).   Language: Performance was impaired on the fundamental ability of visual object  confrontation naming. Verbal fluency measures generated extremely low scores in both the category and phonemic modalities.   Visuospatial Function: Performance on the visuospatial and constructional index of the RBANS was low average; however, an extremely low score on judgment of angular line orientations suggests there must be some problems within this domain. His figure copy, by contrast, was average.   Learning and Memory: Performance on measures of learning and memory suggested notable storage issues with minimal initial encoding of information and rapid forgetting across time without benefit to recognition cuing.  In the verbal realm, Paul Klein recalled 3, 3, and 4 words of a 12-item word list, which is a flat learning curve and suggests impaired memory encoding. He did not retain any of this information on short or long delayed recall nor did he derive any benefit from recognition cuing. Delayed recall of a modestly geometric figure was similar (did not recall any figure details).   Executive Functions: Performance on executive measures was mixed with difficulties on more challenging tasks. Trail Making part B had to be discontinued, because he either did not understand the instructions or could not implement them. Generation of words in response to  given letters was extremely low. Clock drawing was suggestive of "moderate impairment." Reasoning with verbal information was extremely low on the Complex Ideational Material.   Rating Scale(s): The patient screened negative for the presence of depression. He was characterized as functioning at a mild to moderate dementia level with his wife on severity status staging. I was able to rate a CDR for him and his global score is 1.0, his Sum of Boxes score is 6.5.  Viviano Simas Nicole Kindred PsyD, [facility] Clinical Neuropsychologist

## 2020-04-17 NOTE — Progress Notes (Signed)
[facility]  Telemedicine statement:  I discussed the limitations of neuropsychological care via telemedicine and the availability of in person appointments. The patient expressed understanding and agreed to proceed. The patient was verified with two identifiers.  The visit modality was: telephonic The patient location was: home The provider location was: office  The following individuals participated: Connie Hilgert  Feedback Note: I met with Paul Klein to review the findings resulting from his neuropsychological evaluation. Since the last appointment, he has been about the same. He continues to have urinary frequency and saw his PCP who is trying him on Myrbetriq. Time was spent reviewing the impressions and recommendations that are detailed in the evaluation report. We discussed the impression of late mild early moderate stage dementia, likely Alzheimer's given his clinical history, test findings, and imaging. 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 I counseled them on diet, suggested they talk with Dr. Delice Lesch about medications, and also discussed Alzheimer's association but they did not wish for a referral. We also discussed that Ativan is typically contraindicated in dementia and he may want to discuss with Dr. Toy Care. I took time to explain the findings and answer all the patient's questions. I encouraged Paul Klein to contact me should he have any further questions or if further follow up is desired.   Current Medications and Medical History   Current Outpatient Medications  Medication Sig Dispense Refill  . aspirin EC 81 MG tablet Take 1 tablet (81 mg total) by mouth daily. 90 tablet 3  . gabapentin (NEURONTIN) 800 MG tablet     . hydrocortisone 2.5 % cream Apply topically 2 (two) times daily. 30 g 1  . LORazepam (ATIVAN) 1 MG tablet 1 mg 2 (two) times daily.     . mirabegron ER  (MYRBETRIQ) 25 MG TB24 tablet Take 1 tablet (25 mg total) by mouth daily. 14 tablet 0  . Multiple Vitamin (MULTIVITAMIN) tablet Take 1 tablet by mouth daily.    . Omega-3 Fatty Acids (FISH OIL) 1000 MG CAPS Take by mouth.    . psyllium (METAMUCIL) 58.6 % packet Take 1 packet by mouth daily. (Patient not taking: Reported on 04/15/2020)    . simvastatin (ZOCOR) 10 MG tablet TAKE 1 TABLET(10 MG) BY MOUTH DAILY AT 6 PM 90 tablet 1   No current facility-administered medications for this visit.    Patient Active Problem List   Diagnosis Date Noted  . Dementia without behavioral disturbance ([place]) 04/15/2020  . Abdominal discomfort 04/15/2020  . Frequent urination 04/15/2020  . Elevated glucose 04/15/2020  . Electrolyte abnormality 04/15/2020  . Elevated blood-pressure reading without diagnosis of hypertension 04/15/2020  . Rectal discomfort 10/15/2019  . Constipation 10/15/2019  . Blurred vision 02/06/2019  . Bradycardia 07/18/2018  . Onychomycosis 08/30/2016  . Insomnia 08/03/2015  . Generalized anxiety disorder 08/03/2015  . Screening for prostate cancer 08/03/2015  . Need for influenza vaccination 08/03/2015  . Vaccine counseling 08/03/2015  . Second hand tobacco smoke exposure 07/31/2014  . Dyslipidemia 07/31/2014  . Impaired fasting glucose 07/31/2014    Mental Status and Behavioral Observations  Paul Klein was available at the prespecified time for this telephonic appointment and was alert. Speech was normal in rate, rhythm, volume, and prosody. Self-reported mood was "good" and affect as assessed by vocal quality was mainly euthymic. Thought process was difficult to assess, as he did not participate actively in the encounter. Thought content  was appropriate to the topics discussed. There were no safety concerns identified at today's encounter, such as thoughts of harming self or others.   Plan  Feedback provided regarding the patient's neuropsychological evaluation. He will  follow up with Dr. Delice Lesch regarding Aricept. They did not wish for referral to Alzheimer's association. Reevaluation likely not indicated in this case given severity level. Paul Klein was encouraged to contact me if any questions arise or if further follow up is desired.   Viviano Simas Nicole Kindred, PsyD, [employer] Clinical Neuropsychologist  Service(s) Provided at This Encounter: 28 minutes [phone]; Conjoint therapy with patient present)

## 2020-04-17 NOTE — Patient Instructions (Signed)
Your performance and presentation today were consistent with a mild dementia level problem affecting memory abilities, executive functions, working memory/attention, and speed of processing. There were some additional low scores on measures of attention and visuospatial functioning. Given your level of difficulties with day-to-day functioning and test performance, I think that mild dementia is the appropriate term for your level of cognitive difficulty.   Dementia refers to a group of syndromes where multiple areas of ability are damaged in the brain, such as memory, thinking, judgment, and behavior, and most commonly refers to age related causes of dementia that cause worsening in these abilities over time. Alzheimer's disease is the most common form of dementia in people over the age of 31. Not all dementias are Alzheimer's disease, but all Alzheimer's disease is dementia. When dementia is due to an underlying condition affecting the brain, such as Alzheimer's disease, there is progression over time, which typically procedes gradually over many years.   In your case, I believe that your dementia is due to Alzheimer's disease. Alzheimer's is a brain disease that starts out as much as a decade before individuals manifest a clinical dementia syndrome. Given your level of progression and cognitive presentation, my sense is that this problem has probably been simmering in the background for some time, and that may make it early onset (while somewhat arbitrary, dementia that begins before the age of 90 is considered early onset).   Dr. Delice Lesch is already doing a thorough workup for reversible causes of dementia. You already have neuroimaging. From here, I would recommend that you implement lifestyle changes that can be effective at contributing to cognitive longevity. This generally includes dietary changes and exercise.   Dr. Delice Lesch could also consider starting a cholinesterase inhibitor.   I would recommend  that your prescribing providers minimize the use of brain impairing medication. You are taking Lorazepam and Gabapentin but report that you really have no anxiety. You may wish to discuss the need for these medications with your prescribing providers.   There is now good quality evidence from at least one large scale study that a modified mediterranean diet may help slow cognitive decline. This is known as the "MIND" diet. The Mind diet is not so much a specific diet as it is a set of recommendations for things that you should and should not eat.   Foods that are ENCOURAGED on the MIND Diet:  Green, leafy vegetables: Aim for six or more servings per week. This includes kale, spinach, cooked greens and salads.  All other vegetables: Try to eat another vegetable in addition to the green leafy vegetables at least once a day. It is best to choose non-starchy vegetables because they have a lot of nutrients with a low number of calories.  Berries: Eat berries at least twice a week. There is a plethora of research on strawberries, and other berries such as blueberries, raspberries and blackberries have also been found to have antioxidant and brain health benefits.  Nuts: Try to get five servings of nuts or more each week. The creators of the [place] don't specify what kind of nuts to consume, but it is probably best to vary the type of nuts you eat to obtain a variety of nutrients. Peanuts are a legume and do not fall into this category.  Olive oil: Use olive oil as your main cooking oil. There may be other heart-healthy alternatives such as algae oil, though there is not yet sufficient research upon which to base a  formal recommendation.  Whole grains: Aim for at least three servings daily. Choose minimally processed grains like oatmeal, quinoa, brown rice, whole-wheat pasta and 100% whole-wheat bread.  Fish: Eat fish at least once a week. It is best to choose fatty fish like salmon, sardines, trout, tuna and  mackerel for their high amounts of omega-3 fatty acids.  Beans: Include beans in at least four meals every week. This includes all beans, lentils and soybeans.  Poultry: Try to eat chicken or [place] at least twice a week. Note that fried chicken is not encouraged on the MIND diet.  Wine: Aim for no more than one glass of alcohol daily. Both red and white wine may benefit the brain. However, much research has focused on the red wine compound resveratrol, which may help protect against Alzheimer's disease.  Foods that are DISCOURAGED on the MIND Diet: Butter and margarine: Try to eat less than 1 tablespoon (about 14 grams) daily. Instead, try using olive oil as your primary cooking fat, and dipping your bread in olive oil with herbs.  Cheese: The MIND diet recommends limiting your cheese consumption to less than once per week.  Red meat: Aim for no more than three servings each week. This includes all beef, pork, lamb and products made from these meats.  Maceo Pro food: The MIND diet highly discourages fried food, especially the kind from fast-food restaurants. Limit your consumption to less than once per week.  Pastries and sweets: This includes most of the processed junk food and desserts you can think of. Ice cream, cookies, brownies, snack cakes, donuts, candy and more. Try to limit these to no more than four times a week.  Exercise is one of the best medicines for promoting health and maintaining cognitive fitness at all stages in life. Exercise probably has the largest documented effect on brain health and performance of any intervention. Studies have shown that even previously sedentary individuals who start exercising as late as age 67 show a significant survival benefit as compared to their non-exercising peers. In the [place], the current guidelines are for 30 minutes of moderate exercise per day, but increasing your activity level less than that may also be helpful. You do not have to get your  30 minutes of exercise in one shot and exercising for short periods of time spread throughout the day can be helpful. Go for several walks, learn to dance, or do something else you enjoy that gets your body moving. Of course, if you have an underlying medical condition or there is any question about whether it is safe for you to exercise, you should consult a medical treatment provider prior to beginning exercise.

## 2020-04-29 ENCOUNTER — Other Ambulatory Visit: Payer: Self-pay | Admitting: Medical

## 2020-04-29 ENCOUNTER — Telehealth: Payer: Self-pay | Admitting: Medical

## 2020-04-29 ENCOUNTER — Other Ambulatory Visit: Payer: Self-pay

## 2020-04-29 ENCOUNTER — Encounter: Payer: Self-pay | Admitting: Medical

## 2020-04-29 ENCOUNTER — Ambulatory Visit (INDEPENDENT_AMBULATORY_CARE_PROVIDER_SITE_OTHER): Payer: Medicare HMO | Admitting: Medical

## 2020-04-29 VITALS — BP 166/76 | HR 58 | Ht 72.0 in | Wt 148.2 lb

## 2020-04-29 DIAGNOSIS — F0390 Unspecified dementia without behavioral disturbance: Secondary | ICD-10-CM | POA: Diagnosis not present

## 2020-04-29 DIAGNOSIS — R634 Abnormal weight loss: Secondary | ICD-10-CM | POA: Insufficient documentation

## 2020-04-29 DIAGNOSIS — N401 Enlarged prostate with lower urinary tract symptoms: Secondary | ICD-10-CM | POA: Diagnosis not present

## 2020-04-29 DIAGNOSIS — N3281 Overactive bladder: Secondary | ICD-10-CM | POA: Insufficient documentation

## 2020-04-29 DIAGNOSIS — Z1211 Encounter for screening for malignant neoplasm of colon: Secondary | ICD-10-CM | POA: Insufficient documentation

## 2020-04-29 DIAGNOSIS — R350 Frequency of micturition: Secondary | ICD-10-CM | POA: Diagnosis not present

## 2020-04-29 DIAGNOSIS — I10 Essential (primary) hypertension: Secondary | ICD-10-CM

## 2020-04-29 DIAGNOSIS — R7301 Impaired fasting glucose: Secondary | ICD-10-CM | POA: Diagnosis not present

## 2020-04-29 MED ORDER — DOXAZOSIN MESYLATE 1 MG PO TABS: 1.0000 mg | ORAL_TABLET | Freq: Every day | ORAL | 1 refills | Status: DC

## 2020-04-29 NOTE — Telephone Encounter (Signed)
Patient wife has been informed and agreed.

## 2020-04-29 NOTE — Telephone Encounter (Signed)
Please call patient and wife.  I read over the neuropsychology notes.  They did recommend considering the medication like Aricept.  The only concern I have is that 1 side effect can be urinary frequency.  Since he already has this issue, lets get initiated on the new blood pressure pill today and let us give this 2 weeks before we start anything else.  I want to make sure he is seeing improvements.  If he has improvements within 2 weeks and we could consider starting something called Aricept to help with memory.

## 2020-04-29 NOTE — Progress Notes (Signed)
Subjective:  Paul Klein is a 64 y.o. male who presents for Chief Complaint  Patient presents with  . Follow-up    still having frequent urination      Here with wife accompanying him.    Here for f/u.  I saw him 04/15/20 for concerns about urination.   Urine culture from last visit normal.  He has been having increased urination. Worse if drinking some beers.  No burning, no blood in urine.  No cloudy urine, no odor.   Has been worse in recent months.  He thinks he may urinate 20 times per day.   He notes some occasional abdominal discomfort he associates with the urine.  Bowel movements are regular.  Sometimes feels bloated though.  No fever, no trauma to the abdomen.  No recent strenuous exercise.  no incontinence.   He notes that he drinks on average 3 beers per day.  Denies heavy caffeine or sugary drink use otherwise.  He has been seeing neurology for consult regarding memory loss and dementia.  He had a comprehensive evaluation recently and they follow-up with neurology in 1 week.  Wife notes weight loss.   He does recognize he is not eating as much as prior.  No blood in stool, no fever, no night sweats.   otherwise has been in usual state of health  No other aggravating or relieving factors.    No other c/o.  Past Surgical History:  Procedure Laterality Date  . ANAL FISSURE REPAIR    . COLONOSCOPY  02/19/13   diverticulosis of sigmoid colon, external and internal hemorrhoids, Dr. Benson Norway, repeat in 10 years  . SKIN BIOPSY     face, back  . WISDOM TOOTH EXTRACTION       The following portions of the patient's history were reviewed and updated as appropriate: allergies, current medications, past family history, past medical history, past social history, past surgical history and problem list.  ROS Otherwise as in subjective above    Objective: BP (!) 166/76   Pulse (!) 58   Ht 6' (1.829 m)   Wt 148 lb 3.2 oz (67.2 kg)   SpO2 98%   BMI 20.10 kg/m   Wt Readings  from Last 3 Encounters:  04/29/20 148 lb 3.2 oz (67.2 kg)  04/15/20 150 lb 3.2 oz (68.1 kg)  02/28/20 152 lb 9.6 oz (69.2 kg)   BP Readings from Last 3 Encounters:  04/29/20 (!) 166/76  04/15/20 (!) 170/80  02/28/20 (!) 143/76    General appearance: alert, no distress, well developed, well nourished Heart rrr, normal s1, s2, no murmurs Lungs clear Abdomen: +bs, soft, non tender, non distended, no masses, no hepatomegaly, no splenomegaly Back: no CVA tenderness Pulses: 2+ radial pulses, 2+ pedal pulses, normal cap refill Ext: no edema DRE: prostate moderate enlarged, no nodules, no bogginess   Assessment: Encounter Diagnoses  Name Primary?  . Essential hypertension, benign Yes  . Urinary frequency   . Impaired fasting glucose   . Dementia without behavioral disturbance, unspecified dementia type ([place])   . Benign prostatic hyperplasia with urinary frequency   . Weight loss   . Screen for colon cancer      Plan: We discussed several concerns today.  New diagnosis of high blood pressure.  We discussed possible complications of high blood pressure.  We will begin medication below low dose Cardura.  Discussed risk and benefits of medication  Urinary frequency -we discussed differential which could include overactive bladder.  He  does have BPH.  Recently we did a urine culture which was negative.  We will begin trial of Cardura for BPH and HTN.   Hold off on Myrbetriq samples from last visit.  Weight loss-we discussed that as we age sometimes appetite decreases also with his new Alzheimer's diagnosis there is concern for overall decrease in appetite or food consumption.  He has lost over 10 pounds since January.  We will check some additional labs, we will set up for Cologuard to screen for colon cancer.  We may end up doing this scans to rule out other tumor or mass.  Impaired glucose-repeat glucose today  Dementia-reviewed the recent cognitive testing he had done.  He does not  have an appointment till November with the neurologist.  They are requesting to initiate medicine to help with memory as outlined by the psychologist.  I will call them back with recommendations   Sylas was seen today for follow-up.  Diagnoses and all orders for this visit:  Essential hypertension, benign -     Comprehensive metabolic panel -     CBC with Differential/Platelet -     TSH  Urinary frequency  Impaired fasting glucose  Dementia without behavioral disturbance, unspecified dementia type (HCC) -     Comprehensive metabolic panel -     CBC with Differential/Platelet -     TSH  Benign prostatic hyperplasia with urinary frequency  Weight loss -     Comprehensive metabolic panel -     CBC with Differential/Platelet -     TSH  Screen for colon cancer  Other orders -     doxazosin (CARDURA) 1 MG tablet; Take 1 tablet (1 mg total) by mouth daily.    Follow up: pending labs, 3 weeks

## 2020-04-29 NOTE — Telephone Encounter (Signed)
See msg

## 2020-04-30 ENCOUNTER — Other Ambulatory Visit: Payer: Self-pay | Admitting: Medical

## 2020-04-30 ENCOUNTER — Other Ambulatory Visit: Payer: Self-pay

## 2020-04-30 DIAGNOSIS — R630 Anorexia: Secondary | ICD-10-CM

## 2020-04-30 DIAGNOSIS — N401 Enlarged prostate with lower urinary tract symptoms: Secondary | ICD-10-CM

## 2020-04-30 DIAGNOSIS — R634 Abnormal weight loss: Secondary | ICD-10-CM

## 2020-04-30 DIAGNOSIS — Z1211 Encounter for screening for malignant neoplasm of colon: Secondary | ICD-10-CM

## 2020-04-30 LAB — CBC WITH DIFFERENTIAL/PLATELET
Basophils Absolute: 0 10*3/uL (ref 0.0–0.2)
Basos: 1 %
EOS (ABSOLUTE): 0 10*3/uL (ref 0.0–0.4)
Eos: 1 %
Hematocrit: 43.3 % (ref 37.5–51.0)
Hemoglobin: 14.3 g/dL (ref 13.0–17.7)
Immature Grans (Abs): 0 10*3/uL (ref 0.0–0.1)
Immature Granulocytes: 0 %
Lymphocytes Absolute: 1.2 10*3/uL (ref 0.7–3.1)
Lymphs: 23 %
MCH: 29.5 pg (ref 26.6–33.0)
MCHC: 33 g/dL (ref 31.5–35.7)
MCV: 89 fL (ref 79–97)
Monocytes Absolute: 0.3 10*3/uL (ref 0.1–0.9)
Monocytes: 6 %
Neutrophils Absolute: 3.5 10*3/uL (ref 1.4–7.0)
Neutrophils: 69 %
Platelets: 227 10*3/uL (ref 150–450)
RBC: 4.85 x10E6/uL (ref 4.14–5.80)
RDW: 13.1 % (ref 11.6–15.4)
WBC: 5 10*3/uL (ref 3.4–10.8)

## 2020-04-30 LAB — COMPREHENSIVE METABOLIC PANEL
ALT: 18 IU/L (ref 0–44)
AST: 20 IU/L (ref 0–40)
Albumin/Globulin Ratio: 2.6 — ABNORMAL HIGH (ref 1.2–2.2)
Albumin: 4.9 g/dL — ABNORMAL HIGH (ref 3.8–4.8)
Alkaline Phosphatase: 57 IU/L (ref 48–121)
BUN/Creatinine Ratio: 11 (ref 10–24)
BUN: 10 mg/dL (ref 8–27)
Bilirubin Total: 0.6 mg/dL (ref 0.0–1.2)
CO2: 25 mmol/L (ref 20–29)
Calcium: 10 mg/dL (ref 8.6–10.2)
Chloride: 102 mmol/L (ref 96–106)
Creatinine, Ser: 0.95 mg/dL (ref 0.76–1.27)
GFR calc Af Amer: 96 mL/min/{1.73_m2} (ref >59)
GFR calc non Af Amer: 83 mL/min/{1.73_m2} (ref >59)
Globulin, Total: 1.9 g/dL (ref 1.5–4.5)
Glucose: 113 mg/dL — ABNORMAL HIGH (ref 65–99)
Potassium: 4.7 mmol/L (ref 3.5–5.2)
Sodium: 141 mmol/L (ref 134–144)
Total Protein: 6.8 g/dL (ref 6.0–8.5)

## 2020-04-30 LAB — TSH: TSH: 0.844 u[IU]/mL (ref 0.450–4.500)

## 2020-04-30 NOTE — Telephone Encounter (Signed)
reviewed

## 2020-05-13 ENCOUNTER — Other Ambulatory Visit: Payer: Self-pay | Admitting: Medical

## 2020-05-14 ENCOUNTER — Inpatient Hospital Stay: Admission: RE | Admit: 2020-05-14 | Payer: Medicare HMO | Source: Ambulatory Visit

## 2020-05-20 DIAGNOSIS — Z1211 Encounter for screening for malignant neoplasm of colon: Secondary | ICD-10-CM | POA: Diagnosis not present

## 2020-05-20 LAB — COLOGUARD: Cologuard: NEGATIVE

## 2020-05-21 LAB — COLOGUARD

## 2020-05-26 ENCOUNTER — Other Ambulatory Visit: Payer: Self-pay

## 2020-05-26 ENCOUNTER — Ambulatory Visit
Admission: RE | Admit: 2020-05-26 | Discharge: 2020-05-26 | Disposition: A | Discharge status: Home / Self Care | Payer: Medicare HMO | Source: Ambulatory Visit | Attending: Medical | Admitting: Medical

## 2020-05-26 ENCOUNTER — Other Ambulatory Visit: Payer: Self-pay | Admitting: Medical

## 2020-05-26 DIAGNOSIS — N401 Enlarged prostate with lower urinary tract symptoms: Secondary | ICD-10-CM

## 2020-05-26 DIAGNOSIS — R634 Abnormal weight loss: Secondary | ICD-10-CM | POA: Diagnosis not present

## 2020-05-26 DIAGNOSIS — R630 Anorexia: Secondary | ICD-10-CM

## 2020-05-26 DIAGNOSIS — I700 Atherosclerosis of aorta: Secondary | ICD-10-CM | POA: Diagnosis not present

## 2020-05-26 DIAGNOSIS — R109 Unspecified abdominal pain: Secondary | ICD-10-CM | POA: Diagnosis not present

## 2020-05-26 DIAGNOSIS — J8410 Pulmonary fibrosis, unspecified: Secondary | ICD-10-CM | POA: Diagnosis not present

## 2020-05-26 DIAGNOSIS — I2510 Atherosclerotic heart disease of native coronary artery without angina pectoris: Secondary | ICD-10-CM | POA: Diagnosis not present

## 2020-05-26 MED ORDER — IOPAMIDOL (ISOVUE-300) INJECTION 61%
100.0000 mL | Freq: Once | INTRAVENOUS | Status: AC | PRN
  Administered 2020-05-26: 100 mL via INTRAVENOUS

## 2020-05-26 MED ORDER — ROSUVASTATIN CALCIUM 20 MG PO TABS: 20.0000 mg | ORAL_TABLET | Freq: Every day | ORAL | 0 refills | Status: DC

## 2020-05-28 ENCOUNTER — Telehealth: Payer: Self-pay | Admitting: Medical

## 2020-05-28 NOTE — Telephone Encounter (Signed)
Patient informed. 

## 2020-05-28 NOTE — Telephone Encounter (Signed)
Please let them know that the Cologuard screening for colon cancer was negative.  This indicates a lower likelihood that colorectal cancer is present.   Lets plan to repeat this in 3 years.  However, if the develop bowel changes, blood in stool, unexpected weight loss, or other new bowel changes, then recheck. 

## 2020-06-25 ENCOUNTER — Encounter: Payer: Self-pay | Admitting: Medical

## 2020-07-07 ENCOUNTER — Encounter: Payer: Self-pay | Admitting: Medical

## 2020-07-07 ENCOUNTER — Ambulatory Visit (INDEPENDENT_AMBULATORY_CARE_PROVIDER_SITE_OTHER): Payer: Medicare HMO | Admitting: Medical

## 2020-07-07 ENCOUNTER — Other Ambulatory Visit: Payer: Self-pay

## 2020-07-07 VITALS — BP 124/76 | HR 70 | Wt 144.0 lb

## 2020-07-07 DIAGNOSIS — R770 Abnormality of albumin: Secondary | ICD-10-CM | POA: Diagnosis not present

## 2020-07-07 DIAGNOSIS — F0390 Unspecified dementia without behavioral disturbance: Secondary | ICD-10-CM

## 2020-07-07 DIAGNOSIS — F411 Generalized anxiety disorder: Secondary | ICD-10-CM

## 2020-07-07 DIAGNOSIS — N3281 Overactive bladder: Secondary | ICD-10-CM

## 2020-07-07 DIAGNOSIS — Z8249 Family history of ischemic heart disease and other diseases of the circulatory system: Secondary | ICD-10-CM

## 2020-07-07 DIAGNOSIS — E785 Hyperlipidemia, unspecified: Secondary | ICD-10-CM | POA: Diagnosis not present

## 2020-07-07 DIAGNOSIS — R7301 Impaired fasting glucose: Secondary | ICD-10-CM | POA: Diagnosis not present

## 2020-07-07 DIAGNOSIS — R350 Frequency of micturition: Secondary | ICD-10-CM | POA: Diagnosis not present

## 2020-07-07 DIAGNOSIS — I2510 Atherosclerotic heart disease of native coronary artery without angina pectoris: Secondary | ICD-10-CM

## 2020-07-07 DIAGNOSIS — N401 Enlarged prostate with lower urinary tract symptoms: Secondary | ICD-10-CM | POA: Diagnosis not present

## 2020-07-07 DIAGNOSIS — R634 Abnormal weight loss: Secondary | ICD-10-CM | POA: Diagnosis not present

## 2020-07-07 DIAGNOSIS — I10 Essential (primary) hypertension: Secondary | ICD-10-CM | POA: Diagnosis not present

## 2020-07-07 DIAGNOSIS — I700 Atherosclerosis of aorta: Secondary | ICD-10-CM

## 2020-07-07 NOTE — Progress Notes (Signed)
Established patient visit   Patient: Paul Klein   DOB: 10-Oct-1955   64 y.o. Male  MRN: [id number] Visit Date: 07/07/2020  Today's healthcare provider: Dorothea Ogle, PA-C   Chief Complaint  Patient presents with   Follow-up   Subjective    HPI  Follow-up Patient presents today for follow-up after CT abdomen scan.  Here with wife today.   There was no abnormality or findings on the CT scan. Patient reports some bloating but no abdominal pain.   Patients wife reports that he do complain of increased urination. He was prescribed Doxazosin 1 mg for his increased urination and reports it has helped some.  He thinks he has a metal splinter in his right index finger times a couple weeks  I relatively leads twice a day.  No change in appetite recently.    Medications: Outpatient Medications Prior to Visit  Medication Sig   aspirin EC 81 MG tablet Take 1 tablet (81 mg total) by mouth daily.   doxazosin (CARDURA) 1 MG tablet TAKE 1 TABLET(1 MG) BY MOUTH DAILY   gabapentin (NEURONTIN) 800 MG tablet    hydrocortisone 2.5 % cream Apply topically 2 (two) times daily.   Multiple Vitamin (MULTIVITAMIN) tablet Take 1 tablet by mouth daily.   Omega-3 Fatty Acids (FISH OIL) 1000 MG CAPS Take by mouth.   psyllium (METAMUCIL) 58.6 % packet Take 1 packet by mouth daily.    rosuvastatin (CRESTOR) 20 MG tablet Take 1 tablet (20 mg total) by mouth daily.   LORazepam (ATIVAN) 1 MG tablet 1 mg 2 (two) times daily.  (Patient not taking: Reported on 07/07/2020)   No facility-administered medications prior to visit.    Review of Systems As in subjective   Objective    BP 124/76 (BP Location: Right Arm, Patient Position: Sitting, Cuff Size: Normal)    Pulse 70    Wt 144 lb (65.3 kg)    SpO2 97%    BMI 19.53 kg/m   Wt Readings from Last 3 Encounters:  07/07/20 144 lb (65.3 kg)  04/29/20 148 lb 3.2 oz (67.2 kg)  04/15/20 150 lb 3.2 oz (68.1 kg)    Physical Exam  General:  Well-built well-nourished no acute distress Pleasant, answers questions appropriate, good eye contact  CT chest abdomen pelvis 05/26/2020 Impression No acute abnormality or finding to explain the patient's symptoms.  Calcific aortic and coronary atherosclerosis.    Results for orders placed or performed in visit on 07/07/20  Cologuard  Result Value Ref Range   Cologuard      Assessment & Plan   Encounter Diagnoses  Name Primary?   Abnormal albumin Yes   Impaired fasting blood sugar    Overactive bladder    Essential hypertension, benign    Dementia without behavioral disturbance, unspecified dementia type ([place])    Dyslipidemia    Weight loss    Benign prostatic hyperplasia with urinary frequency    Urinary frequency    Generalized anxiety disorder    Atherosclerosis of coronary artery of native heart without angina pectoris, unspecified vessel or lesion type    Aortic atherosclerosis ([place])    Family history of CABG        We discussed his recent labs and scan.  Impaired glucose on recent labs, add hemoglobin A1c screen today.  Counseled on the need to have healthy low sugar diet  Recent elevated albumin and globulin-add SPEP lab today for screening  I reviewed the recent negative Cologuard  test he had done for colon cancer screening  Weight loss-I reviewed prior weights the chart.  He has lost about 20 pounds this year but lately has been stable.  This could be due to several reasons including worsening memory and dementia issues, changes in blood sugars, underlying conditions.  We discussed cancer screenings.  We discussed adding SPEP test today.  We discussed his recent CT chest abdomen pelvis showing no obvious cancer or tumor.  He did have CAD and aortic atherosclerosis on recent scan-continue statin, discussed risk factor modification.  Given family history of heart disease in both parents with CABG, refer to cardiology for baseline  evaluation  Anxiety-advise he follow-up with neurology or psychiatry.  Advise he discuss benzodiazepines with him as psychiatry did not feel this is a good idea for him to be taking benzodiazepines  Overactive bladder, urinary frequency-continue Cardura  Hypertension- continue Cardura   Paul Klein was seen today for follow-up.  Diagnoses and all orders for this visit:  Abnormal albumin -     Protein electrophoresis, serum  Impaired fasting blood sugar -     Hemoglobin A1c -     Ambulatory referral to Cardiology  Overactive bladder  Essential hypertension, benign  Dementia without behavioral disturbance, unspecified dementia type ([place])  Dyslipidemia  Weight loss  Benign prostatic hyperplasia with urinary frequency  Urinary frequency  Generalized anxiety disorder  Atherosclerosis of coronary artery of native heart without angina pectoris, unspecified vessel or lesion type -     Ambulatory referral to Cardiology  Aortic atherosclerosis [facility] [place]) -     Ambulatory referral to Cardiology  Family history of CABG -     Ambulatory referral to Cardiology     Dorothea Ogle, [place] [phone] (phone) [phone] (fax)  [place]

## 2020-07-08 ENCOUNTER — Other Ambulatory Visit: Payer: Self-pay | Admitting: Medical

## 2020-07-08 MED ORDER — ASPIRIN EC 81 MG PO TBEC: 81.0000 mg | DELAYED_RELEASE_TABLET | Freq: Every day | ORAL | 3 refills | Status: DC

## 2020-07-08 MED ORDER — ROSUVASTATIN CALCIUM 20 MG PO TABS: 20.0000 mg | ORAL_TABLET | Freq: Every day | ORAL | 3 refills | Status: DC

## 2020-07-08 MED ORDER — DOXAZOSIN MESYLATE 1 MG PO TABS: ORAL_TABLET | ORAL | 0 refills | Status: DC

## 2020-07-09 LAB — PROTEIN ELECTROPHORESIS, SERUM
A/G Ratio: 1.6 (ref 0.7–1.7)
Albumin ELP: 4.3 g/dL (ref 2.9–4.4)
Alpha 1: 0.3 g/dL (ref 0.0–0.4)
Alpha 2: 0.7 g/dL (ref 0.4–1.0)
Beta: 1 g/dL (ref 0.7–1.3)
Gamma Globulin: 0.8 g/dL (ref 0.4–1.8)
Globulin, Total: 2.7 g/dL (ref 2.2–3.9)
Total Protein: 7 g/dL (ref 6.0–8.5)

## 2020-07-09 LAB — HEMOGLOBIN A1C
Est. average glucose Bld gHb Est-mCnc: 128 mg/dL
Hgb A1c MFr Bld: 6.1 % — ABNORMAL HIGH (ref 4.8–5.6)

## 2020-07-22 ENCOUNTER — Other Ambulatory Visit: Payer: Self-pay | Admitting: Medical

## 2020-07-28 ENCOUNTER — Encounter: Payer: Self-pay | Admitting: Cardiology

## 2020-07-28 ENCOUNTER — Ambulatory Visit: Payer: Medicare HMO | Admitting: Cardiology

## 2020-07-28 ENCOUNTER — Other Ambulatory Visit: Payer: Self-pay

## 2020-07-28 VITALS — BP 141/74 | HR 63 | Resp 16 | Ht 71.0 in | Wt 143.0 lb

## 2020-07-28 DIAGNOSIS — I700 Atherosclerosis of aorta: Secondary | ICD-10-CM | POA: Diagnosis not present

## 2020-07-28 DIAGNOSIS — F0280 Dementia in other diseases classified elsewhere without behavioral disturbance: Secondary | ICD-10-CM | POA: Diagnosis not present

## 2020-07-28 DIAGNOSIS — I2584 Coronary atherosclerosis due to calcified coronary lesion: Secondary | ICD-10-CM | POA: Diagnosis not present

## 2020-07-28 DIAGNOSIS — G309 Alzheimer's disease, unspecified: Secondary | ICD-10-CM | POA: Diagnosis not present

## 2020-07-28 DIAGNOSIS — E785 Hyperlipidemia, unspecified: Secondary | ICD-10-CM

## 2020-07-28 DIAGNOSIS — I2510 Atherosclerotic heart disease of native coronary artery without angina pectoris: Secondary | ICD-10-CM | POA: Diagnosis not present

## 2020-07-28 DIAGNOSIS — Z87891 Personal history of nicotine dependence: Secondary | ICD-10-CM | POA: Diagnosis not present

## 2020-07-28 NOTE — Progress Notes (Signed)
Date:  07/28/2020   ID:  ADRICK KESTLER, DOB 03-07-1956, MRN [id number]  PCP:  Carlena Hurl, PA-C  Cardiologist:  Rex Kras, DO, [facility] (established care 07/28/2020) Former Cardiology Providers: Dr. Landry Corporal  REASON FOR CONSULT: Aortic atherosclerosis, family history of CABG, atherosclerosis of the coronary arteries without angina pectoris  REQUESTING PHYSICIAN:  Carlena Hurl, PA-C [address] [place],  [place] [address]  Chief Complaint  Patient presents with  . Hypertension  . Aortic atherosclerosis  . Coronary Artery Disease  . New Patient (Initial Visit)    HPI  Paul Klein is a 64 y.o. male who presents to the office with a chief complaint of " coronary artery calcification." Patient's past medical history and cardiovascular risk factors include: Alzheimer's disease, dyslipidemia, coronary artery calcifications, aortic atherosclerosis, former smoker, advance age.   He is referred to the office at the request of Tysinger, Camelia Eng, PA-C for evaluation of aortic atherosclerosis, family history of CABG, atherosclerosis of the coronary arteries without angina pectoris.  Patient presents to the office accompanied by his wife Paul Klein.  Patient states that he recently had a CT of the chest/abdomen/pelvis back in August 2021.  He was noted to have coronary and aortic atherosclerosis and is referred to cardiology for further evaluation and management.  Patient denies any chest pain or shortness of breath at rest or with effort related activities.  He walks about 1 to 2 miles twice a week and has not noticed any change in physical endurance or functional capacity.  He does have family history of CAD requiring surgical revascularization.  He used to follow-up with Dr. Wynonia Lawman.  No family history of premature coronary disease or sudden cardiac death.  FUNCTIONAL STATUS: Walks 1-2 miles atleast twice a week.    ALLERGIES: Allergies  Allergen Reactions  . Prozac  [Fluoxetine Hcl]     SSRIs in general cause worsened depression and suicidal ideation    MEDICATION LIST PRIOR TO VISIT: Current Meds  Medication Sig  . aspirin EC 81 MG tablet Take 1 tablet (81 mg total) by mouth daily.  [name] doxazosin (CARDURA) 1 MG tablet TAKE 1 TABLET(1 MG) BY MOUTH DAILY  . gabapentin (NEURONTIN) 800 MG tablet   . Multiple Vitamin (MULTIVITAMIN) tablet Take 1 tablet by mouth daily.  . Omega-3 Fatty Acids (FISH OIL) 1000 MG CAPS Take by mouth.  . psyllium (METAMUCIL) 58.6 % packet Take 1 packet by mouth daily.   . rosuvastatin (CRESTOR) 20 MG tablet Take 1 tablet (20 mg total) by mouth daily.     PAST MEDICAL HISTORY: Past Medical History:  Diagnosis Date  . Alzheimer's disease ([place])   . Anxiety    Dr. Toy Care  . Diverticulosis of colon    sigmoid, per colonoscopy 2014, Dr. Benson Norway  . Dyslipidemia   . Hemorrhoids    internal and externa per 2014 colonoscopy  . Insomnia    uses Neurontin ( Dr. Toy Care)  . Normal cardiac stress test 12/2012   performed due to risk factors, abnormal EKG; Dr. Wynonia Lawman  . Panic attack   . Second hand smoke exposure    wife    PAST SURGICAL HISTORY: Past Surgical History:  Procedure Laterality Date  . ANAL FISSURE REPAIR    . COLONOSCOPY  02/19/13   diverticulosis of sigmoid colon, external and internal hemorrhoids, Dr. Benson Norway, repeat in 10 years  . SKIN BIOPSY     face, back  . WISDOM TOOTH EXTRACTION  FAMILY HISTORY: The patient family history includes Benign prostatic hyperplasia in his father; Cancer in his father and mother; Dementia in his mother; Depression in his mother and sister; Diabetes in his paternal grandfather; Heart disease in his mother; Heart disease (age of onset: 27) in his father; Hypertension in his father; Mental illness in his mother; Stroke in his maternal grandmother.  SOCIAL HISTORY:  The patient  reports that he quit smoking about 51 years ago. His smoking use included cigarettes. He has a 12.00  pack-year smoking history. He has never used smokeless tobacco. He reports current alcohol use. He reports that he does not use drugs.  REVIEW OF SYSTEMS: Review of Systems  Constitutional: Negative for chills and fever.  HENT: Negative for hoarse voice and nosebleeds.   Eyes: Negative for discharge, double vision and pain.  Cardiovascular: Negative for chest pain, claudication, dyspnea on exertion, leg swelling, near-syncope, orthopnea, palpitations, paroxysmal nocturnal dyspnea and syncope.  Respiratory: Negative for hemoptysis and shortness of breath.   Musculoskeletal: Negative for muscle cramps and myalgias.  Gastrointestinal: Negative for abdominal pain, constipation, diarrhea, hematemesis, hematochezia, melena, nausea and vomiting.  Neurological: Negative for dizziness and light-headedness.    PHYSICAL EXAM: Vitals with BMI 07/28/2020 07/07/2020 04/29/2020  Height 5\' 11"  - 6\' 0"   Weight 143 lbs 144 lbs 148 lbs 3 oz  BMI 02.58 - 52.7  Systolic 782 423 536  Diastolic 74 76 76  Pulse 63 70 58    CONSTITUTIONAL: Well-developed and well-nourished. No acute distress.  SKIN: Skin is warm and dry. No rash noted. No cyanosis. No pallor. No jaundice HEAD: Normocephalic and atraumatic.  EYES: No scleral icterus MOUTH/THROAT: Moist oral membranes.  NECK: No JVD present. No thyromegaly noted. No carotid bruits  LYMPHATIC: No visible cervical adenopathy.  CHEST Normal respiratory effort. No intercostal retractions  LUNGS: Clear to auscultation bilaterally.  No stridor. No wheezes. No rales.  CARDIOVASCULAR: Regular rate and rhythm, positive R4-E3, soft holosystolic murmur heard at the apex, no gallops or rubs. ABDOMINAL: No apparent ascites.  EXTREMITIES: No peripheral edema  HEMATOLOGIC: No significant bruising NEUROLOGIC: Oriented to person, place, and time. Nonfocal. Normal muscle tone.  PSYCHIATRIC: Normal mood and affect. Normal behavior. Cooperative  RADIOLOGY: CT  chest/abdomen/pelvis with contrast 05/26/2020: Calcific aortic and coronary atherosclerosis.  CARDIAC DATABASE: EKG: 07/28/2020: Sinus bradycardia, 59 bpm, normal axis, without underlying ischemia or injury pattern.  Echocardiogram: No results found for this or any previous visit from the past 1095 days.   Stress Testing: No results found for this or any previous visit from the past 1095 days.  Heart Catheterization: None  LABORATORY DATA: CBC Latest Ref Rng & Units 04/29/2020 10/15/2019 07/18/2018  WBC 3.4 - 10.8 x10E3/uL 5.0 6.1 6.4  Hemoglobin 13.0 - 17.7 g/dL 14.3 15.0 14.1  Hematocrit 37.5 - 51.0 % 43.3 45.3 43.3  Platelets 150 - 450 x10E3/uL 227 268 251    CMP Latest Ref Rng & Units 07/07/2020 04/29/2020 10/15/2019  Glucose 65 - 99 mg/dL - 113(H) 108(H)  BUN 8 - 27 mg/dL - 10 9  Creatinine 0.76 - 1.27 mg/dL - 0.95 0.96  Sodium 134 - 144 mmol/L - 141 139  Potassium 3.5 - 5.2 mmol/L - 4.7 5.6(H)  Chloride 96 - 106 mmol/L - 102 100  CO2 20 - 29 mmol/L - 25 25  Calcium 8.6 - 10.2 mg/dL - 10.0 10.3(H)  Total Protein 6.0 - 8.5 g/dL 7.0 6.8 6.9  Total Bilirubin 0.0 - 1.2 mg/dL - 0.6 0.6  Alkaline Phos 48 - 121 IU/L - 57 82  AST 0 - 40 IU/L - 20 17  ALT 0 - 44 IU/L - 18 14    Lipid Panel  Lab Results  Component Value Date   CHOL 178 10/15/2019   HDL 91 10/15/2019   LDLCALC 72 10/15/2019   TRIG 84 10/15/2019   CHOLHDL 2.0 10/15/2019   No components found for: NTPROBNP No results for input(s): PROBNP in the last 8760 hours. Recent Labs    10/15/19 1241 04/29/20 0911  TSH 0.854 0.844    BMP Recent Labs    10/15/19 1239 04/29/20 0911  NA 139 141  K 5.6* 4.7  CL 100 102  CO2 25 25  GLUCOSE 108* 113*  BUN 9 10  CREATININE 0.96 0.95  CALCIUM 10.3* 10.0  GFRNONAA 83 83  GFRAA 95 96    HEMOGLOBIN A1C Lab Results  Component Value Date   HGBA1C 6.1 (H) 07/07/2020   MPG 111 08/30/2016    IMPRESSION:    ICD-10-CM   1. Coronary atherosclerosis due to  calcified coronary lesion of native artery  I25.10 PCV ECHOCARDIOGRAM COMPLETE   I25.84 PCV MYOCARDIAL PERFUSION WO LEXISCAN    SARS-COV-2 RNA,(COVID-19) QUAL NAAT  2. Aortic atherosclerosis (HCC)  I70.0 EKG 12-Lead  3. Dyslipidemia  E78.5   4. Alzheimer disease ([place])  G30.9    F02.80   5. Former smoker  Z87.891      RECOMMENDATIONS: Paul Klein is a 64 y.o. male whose past medical history and cardiac risk factors include: Alzheimer's disease, dyslipidemia, coronary artery calcifications, aortic atherosclerosis, former smoker, advance age.   Coronary atherosclerosis due to calcified coronary lesions of the native coronary arteries without angina pectoris:  Reviewed the CT scan from August 2021 which does note both aortic and coronary calcification.  Patient is already on aspirin and statin therapy.  Statin therapy was recently uptitrated due to his underlying dyslipidemia, per patient's wife.  Echocardiogram will be ordered to evaluate for structural heart disease and left ventricular systolic function.  Exercise nuclear stress test recommended to evaluate for reversible ischemia and chronotropic competence.  Covid screen prior to exercise stress test  Aortic atherosclerosis:  Continue aspirin and statin therapy.  Educated on the importance of improving his modifiable cardiovascular risk factors.  Dyslipidemia:  Currently on statin therapy.  Independently reviewed the last lipid profile from January 2021.  Patient states that he will have a repeat fasting lipid profile at his yearly physical in January 2022.  Currently managed by primary care provider.  Former smoker: Educated on the importance of continued smoking cessation.  FINAL MEDICATION LIST END OF ENCOUNTER: No orders of the defined types were placed in this encounter.   Medications Discontinued During This Encounter  Medication Reason  . hydrocortisone 2.5 % cream Patient Preference     Current  Outpatient Medications:  .  aspirin EC 81 MG tablet, Take 1 tablet (81 mg total) by mouth daily., Disp: 90 tablet, Rfl: 3 .  doxazosin (CARDURA) 1 MG tablet, TAKE 1 TABLET(1 MG) BY MOUTH DAILY, Disp: 90 tablet, Rfl: 0 .  gabapentin (NEURONTIN) 800 MG tablet, , Disp: , Rfl:  .  Multiple Vitamin (MULTIVITAMIN) tablet, Take 1 tablet by mouth daily., Disp: , Rfl:  .  Omega-3 Fatty Acids (FISH OIL) 1000 MG CAPS, Take by mouth., Disp: , Rfl:  .  psyllium (METAMUCIL) 58.6 % packet, Take 1 packet by mouth daily. , Disp: , Rfl:  .  rosuvastatin (CRESTOR)  20 MG tablet, Take 1 tablet (20 mg total) by mouth daily., Disp: 90 tablet, Rfl: 3  Orders Placed This Encounter  Procedures  . SARS-COV-2 RNA,(COVID-19) QUAL NAAT  . PCV MYOCARDIAL PERFUSION WO LEXISCAN  . EKG 12-Lead  . PCV ECHOCARDIOGRAM COMPLETE    There are no Patient Instructions on file for this visit.   --Continue cardiac medications as reconciled in final medication list. --Return in about 4 weeks (around 08/25/2020) for Review test results. Or sooner if needed. --Continue follow-up with your primary care physician regarding the management of your other chronic comorbid conditions.  Patient's questions and concerns were addressed to his satisfaction. He voices understanding of the instructions provided during this encounter.   This note was created using a voice recognition software as a result there may be grammatical errors inadvertently enclosed that do not reflect the nature of this encounter. Every attempt is made to correct such errors.  Rex Kras, [place], [facility]  Pager: [phone] Office: [phone]

## 2020-07-29 ENCOUNTER — Telehealth: Payer: Self-pay | Admitting: Medical

## 2020-07-29 NOTE — Telephone Encounter (Signed)
Please have them make sure with the other clinic what their requirements are.  For example procedure done at [facility] health I required to have a Covid test by PCR at a special draw station.  We have both the rapid antigen test and the PCR test.  Most of these procedure pre-Covid testing required the PCR test.  It takes about 1-3 days to get back to PCR test.  So they need to time it accordingly.  But to answer the question, yes he can have a test done here as long as the other office is okay without.

## 2020-07-29 NOTE — Telephone Encounter (Signed)
Pt's wife called and states that pt is having a ECHO at cardiologist 08/17/2020. They are requiring a COVID test be completed prior to appointment. She would like pt to come in to have the test with [name] on Friday 08/14/2020. Please advise pt at [phone].

## 2020-08-04 ENCOUNTER — Other Ambulatory Visit: Payer: Medicare HMO

## 2020-08-04 ENCOUNTER — Other Ambulatory Visit: Payer: Self-pay

## 2020-08-04 ENCOUNTER — Ambulatory Visit: Payer: Medicare HMO

## 2020-08-04 DIAGNOSIS — I2510 Atherosclerotic heart disease of native coronary artery without angina pectoris: Secondary | ICD-10-CM | POA: Diagnosis not present

## 2020-08-04 DIAGNOSIS — I2584 Coronary atherosclerosis due to calcified coronary lesion: Secondary | ICD-10-CM | POA: Diagnosis not present

## 2020-08-10 ENCOUNTER — Other Ambulatory Visit: Payer: Medicare HMO

## 2020-08-14 ENCOUNTER — Other Ambulatory Visit (INDEPENDENT_AMBULATORY_CARE_PROVIDER_SITE_OTHER): Payer: Medicare HMO

## 2020-08-14 ENCOUNTER — Ambulatory Visit: Payer: Medicare HMO | Admitting: Neurology

## 2020-08-14 ENCOUNTER — Other Ambulatory Visit: Payer: Self-pay

## 2020-08-14 ENCOUNTER — Encounter: Payer: Self-pay | Admitting: Neurology

## 2020-08-14 VITALS — BP 151/78 | HR 58 | Ht 71.0 in | Wt 144.4 lb

## 2020-08-14 DIAGNOSIS — F419 Anxiety disorder, unspecified: Secondary | ICD-10-CM | POA: Diagnosis not present

## 2020-08-14 DIAGNOSIS — Z20822 Contact with and (suspected) exposure to covid-19: Secondary | ICD-10-CM | POA: Diagnosis not present

## 2020-08-14 DIAGNOSIS — Z01812 Encounter for preprocedural laboratory examination: Secondary | ICD-10-CM

## 2020-08-14 DIAGNOSIS — G300 Alzheimer's disease with early onset: Secondary | ICD-10-CM | POA: Diagnosis not present

## 2020-08-14 DIAGNOSIS — F02818 Dementia in other diseases classified elsewhere, unspecified severity, with other behavioral disturbance: Secondary | ICD-10-CM

## 2020-08-14 DIAGNOSIS — Z1152 Encounter for screening for COVID-19: Secondary | ICD-10-CM

## 2020-08-14 DIAGNOSIS — F0281 Dementia in other diseases classified elsewhere with behavioral disturbance: Secondary | ICD-10-CM | POA: Diagnosis not present

## 2020-08-14 LAB — POC COVID19 BINAXNOW: SARS Coronavirus 2 Ag: NEGATIVE

## 2020-08-14 MED ORDER — ESCITALOPRAM OXALATE 10 MG PO TABS: ORAL_TABLET | ORAL | 11 refills | Status: DC

## 2020-08-14 NOTE — Patient Instructions (Signed)
1. Start Lexapro 10mg  daily  2. After a month, if no issues with medication, call our office and we will send in a prescription for Memantine (Namenda) 10mg : take 1 tablet every night for 2 weeks, then increase to 1 tablet twice a day  3. Continue to monitor driving  4. Follow-up in 6 months, call for any changes   FALL PRECAUTIONS: Be cautious when walking. Scan the area for obstacles that may increase the risk of trips and falls. When getting up in the mornings, sit up at the edge of the bed for a few minutes before getting out of bed. Consider elevating the bed at the head end to avoid drop of blood pressure when getting up. Walk always in a well-lit room (use night lights in the walls). Avoid area rugs or power cords from appliances in the middle of the walkways. Use a walker or a cane if necessary and consider physical therapy for balance exercise. Get your eyesight checked regularly.  FINANCIAL OVERSIGHT: Supervision, especially oversight when making financial decisions or transactions is also recommended.  HOME SAFETY: Consider the safety of the kitchen when operating appliances like stoves, microwave oven, and blender. Consider having supervision and share cooking responsibilities until no longer able to participate in those. Accidents with firearms and other hazards in the house should be identified and addressed as well.  DRIVING: Regarding driving, in patients with progressive memory problems, driving will be impaired. We advise to have someone else do the driving if trouble finding directions or if minor accidents are reported. Independent driving assessment is available to determine safety of driving.  ABILITY TO BE LEFT ALONE: If patient is unable to contact 911 operator, consider using LifeLine, or when the need is there, arrange for someone to stay with patients. Smoking is a fire hazard, consider supervision or cessation. Risk of wandering should be assessed by caregiver and if  detected at any point, supervision and safe proof recommendations should be instituted.  MEDICATION SUPERVISION: Inability to self-administer medication needs to be constantly addressed. Implement a mechanism to ensure safe administration of the medications.  RECOMMENDATIONS FOR ALL PATIENTS WITH MEMORY PROBLEMS: 1. Continue to exercise (Recommend 30 minutes of walking everyday, or 3 hours every week) 2. Increase social interactions - continue going to [place] and enjoy social gatherings with friends and family 3. Eat healthy, avoid fried foods and eat more fruits and vegetables 4. Maintain adequate blood pressure, blood sugar, and blood cholesterol level. Reducing the risk of stroke and cardiovascular disease also helps promoting better memory. 5. Avoid stressful situations. Live a simple life and avoid aggravations. Organize your time and prepare for the next day in anticipation. 6. Sleep well, avoid any interruptions of sleep and avoid any distractions in the bedroom that may interfere with adequate sleep quality 7. Avoid sugar, avoid sweets as there is a strong link between excessive sugar intake, diabetes, and cognitive impairment We discussed the Mediterranean diet, which has been shown to help patients reduce the risk of progressive memory disorders and reduces cardiovascular risk. This includes eating fish, eat fruits and green leafy vegetables, nuts like almonds and hazelnuts, walnuts, and also use olive oil. Avoid fast foods and fried foods as much as possible. Avoid sweets and sugar as sugar use has been linked to worsening of memory function.  There is always a concern of gradual progression of memory problems. If this is the case, then we may need to adjust level of care according to patient needs. Support, both  to the patient and caregiver, should then be put into place.

## 2020-08-14 NOTE — Progress Notes (Signed)
NEUROLOGY FOLLOW UP OFFICE NOTE  Paul Klein [id number] [birth year]  HISTORY OF PRESENT ILLNESS: I had the pleasure of seeing Paul Klein in follow-up in the neurology clinic on 08/14/2020.  The patient was last seen 5 months ago for memory loss and is again accompanied by his wife who helps supplement the history today.  Records and images were personally reviewed where available.  I personally reviewed MRI brain with and without contrast done 03/2020 which did not show any acute changes. There was mild diffuse atrophy. Neuropsychological evaluation in July 2021 indicated memory storage problems, naming problems, and impaired verbal fluency. He also had low scores on processing speed and executive function measures. Diagnosis of mild dementia, likely due to Alzheimer's disease.   Since his last visit, he reports good and bad days with his memory. His wife reports more anxiety, he would be pacing around and getting frustrated easily. He does not drive much, no driving concerns from his wife. His wife fills his pillbox and makes sure she takes medications. She manages finances. He is independent with dressing and bathing.    History on Initial Assessment 02/28/2020: This is a pleasant 64 year old right-handed man with a history of hyperlipidemia, anxiety, presenting for evaluation of memory loss. He feels his memory is not that good. He feels he started noticing changes after he stopped working 4 years ago, he went into early retirement to help take care of his mother with dementia (who has passed away). His wife started noticing changes a little under a year. She initially thought he was just not listening to her, but symptoms have progressively worsened. He repeats himself and would not remember conversations from 15 minutes prior. Around 4-5 months ago, he could not recall his computer password which he has had for 14 years. She had to write this down for him and he still forgot. He used to build  computers in the past, but now she has to help him with the computer or with using the TV remote control. She took over finances 5 months ago, noticing that with online banking, there were 10 entries he put in that were already there. He manages his own medications without difficulties. He drives without getting lost, his wife denies any driving concerns. He has always had word-finding issues but this has worsened. His wife has noticed increased irritability, one night he threw the remote control 4 times because he got frustrated he could not get the TV to work. He gets irritated when she tries to correct him. No paranoia or hallucinations. No REM behavior disorder. He has been on gabapentin and lorazepam 1mg  BID for many years for anxiety. He denies feeling anxious a lot, his wife states this is why he takes the medication. He did not recall why he is taking the gabapentin. His hands shake when he is nervous. He denies any headaches, dizziness, diplopia, dysarthria/dysphagia, neck/back pain, focal numbness/tingling/weakness, anosmia, no falls. Sleep is good. He feels his mood is "happy when at home." His mother was diagnosed with dementia at age 75. There are other maternal family members with dementia. No history of significant head injuries. He is not drinking alcohol, however his wife notes when their dog died in Sep 03, 2019, he was drinking for 3 months but his memory got really bad, so he stopped alcohol.    Laboratory Data: Lab Results  Component Value Date   TSH 0.844 04/29/2020     PAST MEDICAL HISTORY: Past Medical History:  Diagnosis  Date  . Alzheimer's disease ([place])   . Anxiety    Dr. Toy Care  . Diverticulosis of colon    sigmoid, per colonoscopy 2014, Dr. Benson Norway  . Dyslipidemia   . Hemorrhoids    internal and externa per 2014 colonoscopy  . Insomnia    uses Neurontin ( Dr. Toy Care)  . Normal cardiac stress test 12/2012   performed due to risk factors, abnormal EKG; Dr. Wynonia Lawman  . Panic  attack   . Second hand smoke exposure    wife    MEDICATIONS: Current Outpatient Medications on File Prior to Visit  Medication Sig Dispense Refill  . aspirin EC 81 MG tablet Take 1 tablet (81 mg total) by mouth daily. 90 tablet 3  . doxazosin (CARDURA) 1 MG tablet TAKE 1 TABLET(1 MG) BY MOUTH DAILY 90 tablet 0  . gabapentin (NEURONTIN) 800 MG tablet     . Multiple Vitamin (MULTIVITAMIN) tablet Take 1 tablet by mouth daily.    . Omega-3 Fatty Acids (FISH OIL) 1000 MG CAPS Take by mouth.    . psyllium (METAMUCIL) 58.6 % packet Take 1 packet by mouth daily.     . rosuvastatin (CRESTOR) 20 MG tablet Take 1 tablet (20 mg total) by mouth daily. 90 tablet 3   No current facility-administered medications on file prior to visit.    ALLERGIES: Allergies  Allergen Reactions  . Prozac [Fluoxetine Hcl]     SSRIs in general cause worsened depression and suicidal ideation    FAMILY HISTORY: Family History  Problem Relation Age of Onset  . Heart disease Mother        BYPASS HEART SURGERY  . Depression Mother   . Mental illness Mother   . Cancer Mother        skin  . Dementia Mother   . Hypertension Father   . Benign prostatic hyperplasia Father   . Cancer Father        skin  . Heart disease Father 32       CABG  . Depression Sister   . Stroke Maternal Grandmother   . Diabetes Paternal Grandfather     SOCIAL HISTORY: Social History   Socioeconomic History  . Marital status: Married    Spouse name: Not on file  . Number of children: 0  . Years of education: Not on file  . Highest education level: Not on file  Occupational History  . Not on file  Tobacco Use  . Smoking status: Former Smoker    Packs/day: 2.00    Years: 6.00    Pack years: 12.00    Types: Cigarettes    Quit date: 1970    Years since quitting: 51.9  . Smokeless tobacco: Never Used  Vaping Use  . Vaping Use: Never used  Substance and Sexual Activity  . Alcohol use: Yes    Comment: occ  . Drug use:  No    Comment: marijuana in the past  . Sexual activity: Not on file  Other Topics Concern  . Not on file  Social History Narrative   Walks the dog, does calisthenics.  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 1 grandchild lives in [place], other grandchild teenager, wants to spend time with his friends. 08/2017   Right handed    Social Determinants of Health   Financial Resource Strain:   . Difficulty of Paying Living Expenses: Not on file  Food Insecurity:   . Worried About  Running Out of Food in the Last Year: Not on file  . Ran Out of Food in the Last Year: Not on file  Transportation Needs:   . Lack of Transportation (Medical): Not on file  . Lack of Transportation (Non-Medical): Not on file  Physical Activity:   . Days of Exercise per Week: Not on file  . Minutes of Exercise per Session: Not on file  Stress:   . Feeling of Stress : Not on file  Social Connections:   . Frequency of Communication with Friends and Family: Not on file  . Frequency of Social Gatherings with Friends and Family: Not on file  . Attends Religious Services: Not on file  . Active Member of Clubs or Organizations: Not on file  . Attends Archivist Meetings: Not on file  . Marital Status: Not on file  Intimate Partner Violence:   . Fear of Current or Ex-Partner: Not on file  . Emotionally Abused: Not on file  . Physically Abused: Not on file  . Sexually Abused: Not on file     PHYSICAL EXAM: Vitals:   08/14/20 1440  BP: (!) 151/78  Pulse: (!) 58  SpO2: 99%   General: No acute distress Head:  Normocephalic/atraumatic Skin/Extremities: No rash, no edema Neurological Exam: alert and oriented to person, place, and time. Initially said year is 2022, then corrected to 2021. No aphasia or dysarthria. Fund of knowledge is appropriate.  Recent and remote memory are impaired, 0/3 delayed recall. Attention and concentration are normal.    Cranial nerves: Pupils equal, round. Extraocular movements intact with no nystagmus. Visual fields full.  No facial asymmetry.  Motor: Bulk and tone normal, muscle strength 5/5 throughout with no pronator drift.   Finger to nose testing intact.  Gait narrow-based and steady, able to tandem walk adequately.  Romberg negative.   IMPRESSION: This is a pleasant 64 yo RH man with a history of hyperlipidemia, anxiety, with recent Neuropsychological evaluation indicating early onset Alzheimer's disease, mild. MRI brain showed mild diffuse atrophy. Diagnosis and prognosis discussed with patient and wife today. His wife reports significant anxiety and would like to start an SSRI first. We discussed side effects of Lexapro 10mg  daily. They will call our office for an update in a month, if no issues, we will start Memantine 10mg  qhs x 2 weeks, then increase to 10mg  BID. Avoiding Donepezil due to bradycardia. Continue to monitor driving. We discussed the importance of control of vascular risk factors, physical exercise, and brain stimulation exercises for brain health. Follow-up in 6 months, they know to call for any changes.   Thank you for allowing me to participate in his care.  Please do not hesitate to call for any questions or concerns.   Ellouise Newer, M.D.   CC: Chana Bode, PA-C

## 2020-08-15 LAB — SARS-COV-2, NAA 2 DAY TAT

## 2020-08-15 LAB — NOVEL CORONAVIRUS, NAA: SARS-CoV-2, NAA: NOT DETECTED

## 2020-08-17 ENCOUNTER — Other Ambulatory Visit: Payer: Medicare HMO

## 2020-08-20 ENCOUNTER — Other Ambulatory Visit: Payer: Self-pay | Admitting: Medical

## 2020-08-24 ENCOUNTER — Encounter: Payer: Self-pay | Admitting: Cardiology

## 2020-08-24 ENCOUNTER — Other Ambulatory Visit: Payer: Self-pay

## 2020-08-24 ENCOUNTER — Ambulatory Visit: Payer: Medicare HMO | Admitting: Cardiology

## 2020-08-24 VITALS — BP 131/62 | HR 54 | Ht 71.0 in | Wt 145.0 lb

## 2020-08-24 DIAGNOSIS — I2510 Atherosclerotic heart disease of native coronary artery without angina pectoris: Secondary | ICD-10-CM

## 2020-08-24 DIAGNOSIS — I700 Atherosclerosis of aorta: Secondary | ICD-10-CM

## 2020-08-24 DIAGNOSIS — G309 Alzheimer's disease, unspecified: Secondary | ICD-10-CM

## 2020-08-24 DIAGNOSIS — I2584 Coronary atherosclerosis due to calcified coronary lesion: Secondary | ICD-10-CM

## 2020-08-24 DIAGNOSIS — Z87891 Personal history of nicotine dependence: Secondary | ICD-10-CM | POA: Diagnosis not present

## 2020-08-24 DIAGNOSIS — E785 Hyperlipidemia, unspecified: Secondary | ICD-10-CM

## 2020-08-24 DIAGNOSIS — F0280 Dementia in other diseases classified elsewhere without behavioral disturbance: Secondary | ICD-10-CM | POA: Diagnosis not present

## 2020-08-24 NOTE — Progress Notes (Signed)
Date:  08/24/2020   ID:  Paul Klein, DOB May 01, 1956, MRN [id number]  PCP:  Paul Hurl, PA-C  Cardiologist:  Paul Kras, DO, [facility] (established Klein 07/28/2020) Former Cardiology Providers: Paul Klein  Date: 08/24/20 Last Office Visit: 07/28/2020  Chief Complaint  Patient presents with   Coronary Artery Disease    test results     HPI  Paul Klein is a 64 y.o. male who presents to the office with a chief complaint of " 1 month follow-up for management of coronary artery calcification and review test results." Patient's past medical history and cardiovascular risk factors include: Alzheimer's disease, dyslipidemia, coronary artery calcifications, aortic atherosclerosis, former smoker, advance age.   He is referred to the office at the request of Tysinger, Camelia Eng, PA-C for evaluation of aortic atherosclerosis, family history of CABG, atherosclerosis of the coronary arteries without angina pectoris.  Patient presents to the office accompanied by his wife Paul Klein.  Patient states that he recently had a CT of the chest/abdomen/pelvis back in August 2021.  He was noted to have coronary and aortic atherosclerosis and is referred to cardiology for further evaluation and management.  Patient denies any chest pain or shortness of breath at rest or with effort related activities.  He walks about 1 to 2 miles twice a week and has not noticed any change in physical endurance or functional capacity.  He does have family history of CAD requiring surgical revascularization.  He used to follow-up with Paul Klein.  At last office visit he was recommended to undergo an echocardiogram and exercise nuclear stress test to evaluate for functional status and reversible ischemia.  Patient did undergo an echocardiogram which notes preserved LVEF without valvular heart disease.  Patient chose not to undergo exercise nuclear stress test as he is hesitant.  Patient's wife also encouraged him to have  the stress study performed given his risk factors of family history; however, patient is just reluctant.  He still remains asymptomatic with good functional capacity for age.  No family history of premature coronary disease or sudden cardiac death.  FUNCTIONAL STATUS: Walks 1-2 miles atleast twice a week.    ALLERGIES: Allergies  Allergen Reactions   Prozac [Fluoxetine Hcl]     SSRIs in general cause worsened depression and suicidal ideation    MEDICATION LIST PRIOR TO VISIT: Current Meds  Medication Sig   aspirin EC 81 MG tablet Take 1 tablet (81 mg total) by mouth daily.   doxazosin (CARDURA) 1 MG tablet TAKE 1 TABLET(1 MG) BY MOUTH DAILY   escitalopram (LEXAPRO) 10 MG tablet Take 1 tablet daily   gabapentin (NEURONTIN) 800 MG tablet    Multiple Vitamin (MULTIVITAMIN) tablet Take 1 tablet by mouth daily.   Omega-3 Fatty Acids (FISH OIL) 1000 MG CAPS Take by mouth.   psyllium (METAMUCIL) 58.6 % packet Take 1 packet by mouth daily.    rosuvastatin (CRESTOR) 20 MG tablet Take 1 tablet (20 mg total) by mouth daily.     PAST MEDICAL HISTORY: Past Medical History:  Diagnosis Date   Alzheimer's disease ([place])    Anxiety    Paul Klein   Diverticulosis of colon    sigmoid, per colonoscopy 2014, Paul Klein   Dyslipidemia    Hemorrhoids    internal and externa per 2014 colonoscopy   Insomnia    uses Neurontin ( Paul Klein)   Normal cardiac stress test 12/2012   performed due to risk factors, abnormal EKG; Paul Klein  Panic attack    Second hand smoke exposure    wife    PAST SURGICAL HISTORY: Past Surgical History:  Procedure Laterality Date   ANAL FISSURE REPAIR     COLONOSCOPY  02/19/13   diverticulosis of sigmoid colon, external and internal hemorrhoids, Paul Klein, repeat in 10 years   SKIN BIOPSY     face, back   WISDOM TOOTH EXTRACTION      FAMILY HISTORY: The patient family history includes Benign prostatic hyperplasia in his father; Cancer in  his father and mother; Dementia in his mother; Depression in his mother and sister; Diabetes in his paternal grandfather; Heart disease in his mother; Heart disease (age of onset: 23) in his father; Hypertension in his father; Mental illness in his mother; Stroke in his maternal grandmother.  SOCIAL HISTORY:  The patient  reports that he quit smoking about 51 years ago. His smoking use included cigarettes. He has a 12.00 pack-year smoking history. He has never used smokeless tobacco. He reports current alcohol use. He reports that he does not use drugs.  REVIEW OF SYSTEMS: Review of Systems  Constitutional: Negative for chills and fever.  HENT: Negative for hoarse voice and nosebleeds.   Eyes: Negative for discharge, double vision and pain.  Cardiovascular: Negative for chest pain, claudication, dyspnea on exertion, leg swelling, near-syncope, orthopnea, palpitations, paroxysmal nocturnal dyspnea and syncope.  Respiratory: Negative for hemoptysis and shortness of breath.   Musculoskeletal: Negative for muscle cramps and myalgias.  Gastrointestinal: Negative for abdominal pain, constipation, diarrhea, hematemesis, hematochezia, melena, nausea and vomiting.  Neurological: Negative for dizziness and light-headedness.   PHYSICAL EXAM: Vitals with BMI 08/24/2020 08/14/2020 07/28/2020  Height 5\' 11"  5\' 11"  5\' 11"   Weight 145 lbs 144 lbs 7 oz 143 lbs  BMI 20.23 00.86 76.19  Systolic 509 326 712  Diastolic 62 78 74  Pulse 54 58 63    CONSTITUTIONAL: Well-developed and well-nourished. No acute distress.  SKIN: Skin is warm and dry. No rash noted. No cyanosis. No pallor. No jaundice HEAD: Normocephalic and atraumatic.  EYES: No scleral icterus MOUTH/THROAT: Moist oral membranes.  NECK: No JVD present. No thyromegaly noted. No carotid bruits  LYMPHATIC: No visible cervical adenopathy.  CHEST Normal respiratory effort. No intercostal retractions  LUNGS: Clear to auscultation bilaterally.  No  stridor. No wheezes. No rales.  CARDIOVASCULAR: Regular rate and rhythm, positive W5-Y0, soft holosystolic murmur heard at the apex, no gallops or rubs. ABDOMINAL: No apparent ascites.  EXTREMITIES: No peripheral edema  HEMATOLOGIC: No significant bruising NEUROLOGIC: Oriented to person, place, and time. Nonfocal. Normal muscle tone.  PSYCHIATRIC: Normal mood and affect. Normal behavior. Cooperative  RADIOLOGY: CT chest/abdomen/pelvis with contrast 05/26/2020: Calcific aortic and coronary atherosclerosis.  CARDIAC DATABASE: EKG: 07/28/2020: Sinus bradycardia, 59 bpm, normal axis, without underlying ischemia or injury pattern.  Echocardiogram: 08/04/2020:  Left ventricle cavity is normal in size and wall thickness. Normal global wall motion. Normal LV systolic function with EF 60%. Doppler evidence of grade I (impaired) diastolic dysfunction, normal LAP.  Left atrial cavity is moderately dilated.  Normal right atrial pressure.   Stress Testing: No results found for this or any previous visit from the past 1095 days.  Heart Catheterization: None  LABORATORY DATA: CBC Latest Ref Rng & Units 04/29/2020 10/15/2019 07/18/2018  WBC 3.4 - 10.8 x10E3/uL 5.0 6.1 6.4  Hemoglobin 13.0 - 17.7 g/dL 14.3 15.0 14.1  Hematocrit 37.5 - 51.0 % 43.3 45.3 43.3  Platelets 150 - 450 x10E3/uL 227 268 251  CMP Latest Ref Rng & Units 07/07/2020 04/29/2020 10/15/2019  Glucose 65 - 99 mg/dL - 113(H) 108(H)  BUN 8 - 27 mg/dL - 10 9  Creatinine 0.76 - 1.27 mg/dL - 0.95 0.96  Sodium 134 - 144 mmol/L - 141 139  Potassium 3.5 - 5.2 mmol/L - 4.7 5.6(H)  Chloride 96 - 106 mmol/L - 102 100  CO2 20 - 29 mmol/L - 25 25  Calcium 8.6 - 10.2 mg/dL - 10.0 10.3(H)  Total Protein 6.0 - 8.5 g/dL 7.0 6.8 6.9  Total Bilirubin 0.0 - 1.2 mg/dL - 0.6 0.6  Alkaline Phos 48 - 121 IU/L - 57 82  AST 0 - 40 IU/L - 20 17  ALT 0 - 44 IU/L - 18 14    Lipid Panel  Lab Results  Component Value Date   CHOL 178 10/15/2019   HDL  91 10/15/2019   LDLCALC 72 10/15/2019   TRIG 84 10/15/2019   CHOLHDL 2.0 10/15/2019   No components found for: NTPROBNP No results for input(s): PROBNP in the last 8760 hours. Recent Labs    10/15/19 1241 04/29/20 0911  TSH 0.854 0.844    BMP Recent Labs    10/15/19 1239 04/29/20 0911  NA 139 141  K 5.6* 4.7  CL 100 102  CO2 25 25  GLUCOSE 108* 113*  BUN 9 10  CREATININE 0.96 0.95  CALCIUM 10.3* 10.0  GFRNONAA 83 83  GFRAA 95 96    HEMOGLOBIN A1C Lab Results  Component Value Date   HGBA1C 6.1 (H) 07/07/2020   MPG 111 08/30/2016    IMPRESSION:    ICD-10-CM   1. Coronary atherosclerosis due to calcified coronary lesion of native artery  I25.10    I25.84   2. Aortic atherosclerosis (HCC)  I70.0   3. Dyslipidemia  E78.5   4. Former smoker  Z87.891   5. Alzheimer disease ([place])  G30.9    F02.80      RECOMMENDATIONS: Paul Klein is a 64 y.o. male whose past medical history and cardiac risk factors include: Alzheimer's disease, dyslipidemia, coronary artery calcifications, aortic atherosclerosis, former smoker, advance age.   Coronary atherosclerosis due to calcified coronary lesions of the native coronary arteries without angina pectoris:  Reviewed the CT scan from August 2021 which does note both aortic and coronary calcification.  Patient is already on aspirin and statin therapy.  Statin therapy was recently uptitrated due to his underlying dyslipidemia, per patient's wife.  Echocardiogram results reviewed with the patient and his wife at today's office visit.  Reeducated on the importance of undergoing either exercise treadmill stress test or exercise nuclear stress test given his coronary artery calcification noted on a nongated CT study.  However, patient remains reluctant to undergo any stress testing at this time.  We will continue to monitor him symptomatically and he is asked to seek medical attention if he develops any cardiovascular symptoms of  chest pain or shortness of breath.  Aortic atherosclerosis:  Continue aspirin and statin therapy.  Educated on the importance of improving his modifiable cardiovascular risk factors.  Dyslipidemia:  Currently on statin therapy.  Patient states that he will have a repeat fasting lipid profile at his yearly physical in January 2022.  Currently managed by primary Klein provider.  Former smoker: Educated on the importance of continued smoking cessation.  FINAL MEDICATION LIST END OF ENCOUNTER: No orders of the defined types were placed in this encounter.   There are no discontinued medications.  Current Outpatient Medications:    aspirin EC 81 MG tablet, Take 1 tablet (81 mg total) by mouth daily., Disp: 90 tablet, Rfl: 3   doxazosin (CARDURA) 1 MG tablet, TAKE 1 TABLET(1 MG) BY MOUTH DAILY, Disp: 90 tablet, Rfl: 0   escitalopram (LEXAPRO) 10 MG tablet, Take 1 tablet daily, Disp: 30 tablet, Rfl: 11   gabapentin (NEURONTIN) 800 MG tablet, , Disp: , Rfl:    Multiple Vitamin (MULTIVITAMIN) tablet, Take 1 tablet by mouth daily., Disp: , Rfl:    Omega-3 Fatty Acids (FISH OIL) 1000 MG CAPS, Take by mouth., Disp: , Rfl:    psyllium (METAMUCIL) 58.6 % packet, Take 1 packet by mouth daily. , Disp: , Rfl:    rosuvastatin (CRESTOR) 20 MG tablet, Take 1 tablet (20 mg total) by mouth daily., Disp: 90 tablet, Rfl: 3  No orders of the defined types were placed in this encounter.   There are no Patient Instructions on file for this visit.   --Continue cardiac medications as reconciled in final medication list. --Return in about 1 year (around 08/24/2021) for 78yr follow up for coronary artery calcification. Or sooner if needed. --Continue follow-up with your primary Klein physician regarding the management of your other chronic comorbid conditions.  Patient's questions and concerns were addressed to his satisfaction. He voices understanding of the instructions provided during this  encounter.   This note was created using a voice recognition software as a result there may be grammatical errors inadvertently enclosed that do not reflect the nature of this encounter. Every attempt is made to correct such errors.  Total time spent: 22 minutes.  Paul Klein, [place], [facility]  Pager: [phone] Office: [phone]

## 2020-08-27 DIAGNOSIS — I739 Peripheral vascular disease, unspecified: Secondary | ICD-10-CM | POA: Diagnosis not present

## 2020-08-27 DIAGNOSIS — E1151 Type 2 diabetes mellitus with diabetic peripheral angiopathy without gangrene: Secondary | ICD-10-CM | POA: Diagnosis not present

## 2020-08-27 DIAGNOSIS — I10 Essential (primary) hypertension: Secondary | ICD-10-CM | POA: Diagnosis not present

## 2020-09-10 ENCOUNTER — Telehealth: Payer: Self-pay

## 2020-09-10 NOTE — Telephone Encounter (Signed)
Pt has been on the lexapro 10 mg with no problem pt wife called into the office left a voice mail stated he is  ready to start Memantine. Called pt wife back no answer left a voice mail to call the office back

## 2020-09-11 ENCOUNTER — Encounter: Payer: Self-pay | Admitting: Neurology

## 2020-09-11 ENCOUNTER — Telehealth: Payer: Self-pay

## 2020-09-11 ENCOUNTER — Other Ambulatory Visit: Payer: Self-pay

## 2020-09-11 MED ORDER — MEMANTINE HCL 10 MG PO TABS: ORAL_TABLET | ORAL | 5 refills | Status: DC

## 2020-09-11 MED ORDER — MELATONIN 3 MG PO TABS: 3.0000 mg | ORAL_TABLET | Freq: Every day | ORAL | 0 refills | Status: DC

## 2020-09-11 MED ORDER — ESCITALOPRAM OXALATE 10 MG PO TABS
10.0000 mg | ORAL_TABLET | Freq: Two times a day (BID) | ORAL | 5 refills | Status: DC
Start: 2020-09-11 — End: 2021-02-23

## 2020-09-11 NOTE — Telephone Encounter (Signed)
Asking about lexapro 10 mg being BID instead of just daily and starting the memantine also asking if it is ok to start a low dose melatonin because pt is getting up 6 to 8 time a night

## 2020-09-11 NOTE — Telephone Encounter (Signed)
Script for Lexapro 10mg  BID sent in . Also, we did discussed starting Memantine 10mg : take 1 tablet every night for 2 weeks, then increase to 1 tab BID sent in. It is okay to start melatonin 3mg  qhs to see if this helps with sleep, maybe start this first before the Memantine so we are not starting different medications at the same time and if he has side effects we can pinpoint which medication may be causing it. Pt wife verbalized understanding. She is going to do melatonin for 2 weeks before starting memantine

## 2020-09-11 NOTE — Progress Notes (Signed)
[facility] [employer] [place] - PA Case ID: [id number] Need help? Call us at [phone] Outcome Approvedtoday PA Case: [id number], Status: Approved, Coverage Starts on: 09/27/2019 12:00:00 AM, Coverage Ends on: 09/25/2021 12:00:00 AM. Questions? Contact [phone]. Drug Escitalopram Oxalate 10MG  tablets Form [employer] Electronic PA Form

## 2020-09-11 NOTE — Telephone Encounter (Signed)
Ok to do Lexapro 10mg  BID. Also, we did discussed starting Memantine 10mg : take 1 tablet every night for 2 weeks, then increase to 1 tab BID. Pls send in Rx. It is okay to start melatonin 3mg  qhs to see if this helps with sleep, maybe start this first before the Memantine so we are not starting different medications at the same time and if he has side effects we can pinpoint which medication may be causing it. Thanks

## 2020-09-11 NOTE — Telephone Encounter (Signed)
See other phone note

## 2020-10-14 ENCOUNTER — Other Ambulatory Visit: Payer: Self-pay | Admitting: Medical

## 2020-10-15 ENCOUNTER — Ambulatory Visit: Payer: Medicare HMO | Admitting: Medical

## 2020-11-30 ENCOUNTER — Encounter: Payer: Self-pay | Admitting: Medical

## 2020-11-30 ENCOUNTER — Other Ambulatory Visit: Payer: Self-pay

## 2020-11-30 ENCOUNTER — Ambulatory Visit (INDEPENDENT_AMBULATORY_CARE_PROVIDER_SITE_OTHER): Payer: Medicare HMO | Admitting: Medical

## 2020-11-30 VITALS — BP 154/66 | HR 61 | Ht 71.0 in | Wt 162.6 lb

## 2020-11-30 DIAGNOSIS — Z7185 Encounter for immunization safety counseling: Secondary | ICD-10-CM | POA: Diagnosis not present

## 2020-11-30 DIAGNOSIS — B351 Tinea unguium: Secondary | ICD-10-CM

## 2020-11-30 DIAGNOSIS — F0280 Dementia in other diseases classified elsewhere without behavioral disturbance: Secondary | ICD-10-CM | POA: Diagnosis not present

## 2020-11-30 DIAGNOSIS — I2510 Atherosclerotic heart disease of native coronary artery without angina pectoris: Secondary | ICD-10-CM

## 2020-11-30 DIAGNOSIS — Z0000 Encounter for general adult medical examination without abnormal findings: Secondary | ICD-10-CM | POA: Diagnosis not present

## 2020-11-30 DIAGNOSIS — F411 Generalized anxiety disorder: Secondary | ICD-10-CM | POA: Diagnosis not present

## 2020-11-30 DIAGNOSIS — N401 Enlarged prostate with lower urinary tract symptoms: Secondary | ICD-10-CM

## 2020-11-30 DIAGNOSIS — N3281 Overactive bladder: Secondary | ICD-10-CM

## 2020-11-30 DIAGNOSIS — Z7722 Contact with and (suspected) exposure to environmental tobacco smoke (acute) (chronic): Secondary | ICD-10-CM

## 2020-11-30 DIAGNOSIS — Z125 Encounter for screening for malignant neoplasm of prostate: Secondary | ICD-10-CM

## 2020-11-30 DIAGNOSIS — G4700 Insomnia, unspecified: Secondary | ICD-10-CM

## 2020-11-30 DIAGNOSIS — R7301 Impaired fasting glucose: Secondary | ICD-10-CM | POA: Diagnosis not present

## 2020-11-30 DIAGNOSIS — R350 Frequency of micturition: Secondary | ICD-10-CM | POA: Diagnosis not present

## 2020-11-30 DIAGNOSIS — G309 Alzheimer's disease, unspecified: Secondary | ICD-10-CM | POA: Diagnosis not present

## 2020-11-30 DIAGNOSIS — I10 Essential (primary) hypertension: Secondary | ICD-10-CM | POA: Diagnosis not present

## 2020-11-30 DIAGNOSIS — K5900 Constipation, unspecified: Secondary | ICD-10-CM

## 2020-11-30 DIAGNOSIS — R001 Bradycardia, unspecified: Secondary | ICD-10-CM

## 2020-11-30 DIAGNOSIS — E785 Hyperlipidemia, unspecified: Secondary | ICD-10-CM | POA: Insufficient documentation

## 2020-11-30 DIAGNOSIS — Z1211 Encounter for screening for malignant neoplasm of colon: Secondary | ICD-10-CM

## 2020-11-30 NOTE — Patient Instructions (Signed)
Today you had a preventative care visit or wellness visit.    Topics today may have included healthy lifestyle, diet, exercise, preventative care, vaccinations, sick and well care, proper use of emergency dept and after hours care, as well as other concerns.     Recommendations: Continue to return yearly for your annual wellness and preventative care visits.  This gives [name] a chance to discuss healthy lifestyle, exercise, vaccinations, review your chart record, and perform screenings where appropriate.  I recommend you see your eye doctor yearly for routine vision care.  I recommend you see your dentist yearly for routine dental care including hygiene visits twice yearly.  Continue routine f/u with neurology  Complete advanced directives including living will and healthcare power of attorney if you have not done this.  Please get a copy of this.   Vaccination recommendations were reviewed  You are up to date on covid vaccine You are up to date on Shingrix vaccine You are up to date on flu shot You are up to date on tetanus vaccine  I recommend a pneumococcal vaccine - call insurance to verify coverage     Screening for cancer: Colon cancer screening:  I reviewed your cologard test from 2021  We discussed PSA, prostate exam, and prostate cancer screening risks/benefits.     Skin cancer screening: Check your skin regularly for new changes, growing lesions, or other lesions of concern Come in for evaluation if you have skin lesions of concern.  Lung cancer screening: If you have a greater than 30 pack year history of tobacco use, then you qualify for lung cancer screening with a chest CT scan  We currently don't have screenings for other cancers besides breast, cervical, colon, and lung cancers.  If you have a strong family history of cancer or have other cancer screening concerns, please let me know.    Bone health: Get at least 150 minutes of aerobic exercise weekly Get  weight bearing exercise at least once weekly   Heart health: Get at least 150 minutes of aerobic exercise weekly Limit alcohol It is important to maintain a healthy blood pressure and healthy cholesterol numbers   Separate significant issues discussed: This past year was significant for him.  He had come in with his wife last year after some significant weight loss and memory changes.  Unfortunately he has been diagnosed with Alzheimer's and has started medication help with memory.  Reviewed recent neurology notes  Alzheimer's new onset-continue follow-up with neurology, continue current medication  Weight loss-resolved, reviewed labs and CT scan from this past year when we evaluated this  Hypertension-elevated today.  Sees cardiology, continue current medicine, labs today  Impaired fasting glucose-labs today  Hyperlipidemia-labs today, continue statin Crestor 20 mg daily  BPH-continue Cardura doxazosin for blood pressure and prostate  Urinary frequency and overactive bladder improved on current therapy doxazosin  Chronic constipation-no recent complaints  Atherosclerosis-continue statin cholesterol medicine

## 2020-11-30 NOTE — Progress Notes (Signed)
Subjective:    Paul Klein is a 65 y.o. male who presents for Preventative Services visit and chronic medical problems/med check visit.    Primary Care Provider Jaimere Feutz, Camelia Eng, PA-C here for primary care  Current Health Care Team:  Dentist, not recently  Eye doctor, not recently  Dr. Ellouise Newer, neurology  Dr. Rex Kras, cardiology  Dr. Carol Ada   Medical Services you may have received from other than [facility] providers in the past year (date may be approximate) none  Exercise Current exercise habits: Home exercise routine includes walking 7 days a week for 30 minutes. .   Nutrition/Diet Current diet: well balanced  Depression Screen Depression screen [facility] 2/9 11/30/2020  Decreased Interest 0  Down, Depressed, Hopeless 0  PHQ - 2 Score 0  Altered sleeping -  Tired, decreased energy -  Change in appetite -  Feeling bad or failure about yourself  -  Trouble concentrating -  Moving slowly or fidgety/restless -  Suicidal thoughts -  PHQ-9 Score -  Difficult doing work/chores -    Activities of Daily Living Screen/Functional Status Survey Is the patient deaf or have difficulty hearing?: No Does the patient have difficulty seeing, even when wearing glasses/contacts?: No Does the patient have difficulty concentrating, remembering, or making decisions?: Yes Does the patient have difficulty walking or climbing stairs?: No Does the patient have difficulty dressing or bathing?: No Does the patient have difficulty doing errands alone such as visiting a doctor's office or shopping?: No  Can patient draw a clock face showing 3:00 oclock, yes  Fall Risk Screen Fall Risk  11/30/2020 08/14/2020 02/28/2020 10/15/2019 08/31/2017  Falls in the past year? 0 0 1 0 No  Number falls in past yr: - 0 0 0 -  Injury with Fall? - 0 0 0 -  Risk for fall due to : No Fall Risks - - - -  Follow up Falls evaluation completed - - - -    Gait Assessment: Normal gait observed;  YES  Advanced directives  Does patient have a [place]? No Does patient have a Living Will? No  Past Medical History:  Diagnosis Date  . Alzheimer's disease ([place])   . Anxiety    Dr. Toy Care  . Diverticulosis of colon    sigmoid, per colonoscopy 2014, Dr. Benson Norway  . Dyslipidemia   . Hemorrhoids    internal and externa per 2014 colonoscopy  . Insomnia    uses Neurontin ( Dr. Toy Care)  . Normal cardiac stress test 12/2012   performed due to risk factors, abnormal EKG; Dr. Wynonia Lawman  . Panic attack   . Second hand smoke exposure    wife    Past Surgical History:  Procedure Laterality Date  . ANAL FISSURE REPAIR    . COLONOSCOPY  02/19/13   diverticulosis of sigmoid colon, external and internal hemorrhoids, Dr. Benson Norway, repeat in 10 years  . SKIN BIOPSY     face, back  . WISDOM TOOTH EXTRACTION      Social History   Socioeconomic History  . Marital status: Married    Spouse name: Not on file  . Number of children: 0  . Years of education: Not on file  . Highest education level: Not on file  Occupational History  . Not on file  Tobacco Use  . Smoking status: Former Smoker    Packs/day: 2.00    Years: 6.00    Pack years: 12.00    Types: Cigarettes  Quit date: 1970    Years since quitting: 52.2  . Smokeless tobacco: Never Used  Vaping Use  . Vaping Use: Never used  Substance and Sexual Activity  . Alcohol use: Yes    Comment: occ  . Drug use: No    Comment: marijuana in the past  . Sexual activity: Not on file  Other Topics Concern  . Not on file  Social History Narrative   Walks the dog, does calisthenics.  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 1 grandchild lives in [place], other grandchild teenager, wants to spend time with his friends. 08/2017   Right handed    Social Determinants of Health   Financial Resource Strain: Not on file  Food Insecurity: Not on file  Transportation Needs: Not on  file  Physical Activity: Not on file  Stress: Not on file  Social Connections: Not on file  Intimate Partner Violence: Not on file    Family History  Problem Relation Age of Onset  . Heart disease Mother        BYPASS HEART SURGERY  . Depression Mother   . Mental illness Mother   . Cancer Mother        skin  . Dementia Mother   . Hypertension Father   . Benign prostatic hyperplasia Father   . Cancer Father        skin  . Heart disease Father 42       CABG  . Depression Sister   . Stroke Maternal Grandmother   . Diabetes Paternal Grandfather      Current Outpatient Medications:  .  aspirin EC 81 MG tablet, Take 1 tablet (81 mg total) by mouth daily., Disp: 90 tablet, Rfl: 3 .  doxazosin (CARDURA) 1 MG tablet, TAKE 1 TABLET(1 MG) BY MOUTH DAILY, Disp: 90 tablet, Rfl: 0 .  escitalopram (LEXAPRO) 10 MG tablet, Take 1 tablet (10 mg total) by mouth 2 (two) times daily., Disp: 60 tablet, Rfl: 5 .  gabapentin (NEURONTIN) 800 MG tablet, , Disp: , Rfl:  .  melatonin 3 MG TABS tablet, Take 1 tablet (3 mg total) by mouth at bedtime., Disp: , Rfl: 0 .  memantine (NAMENDA) 10 MG tablet, take 1 tablet every night for 2 weeks, then increase to 1 tab BID, Disp: 60 tablet, Rfl: 5 .  Multiple Vitamin (MULTIVITAMIN) tablet, Take 1 tablet by mouth daily., Disp: , Rfl:  .  Omega-3 Fatty Acids (FISH OIL) 1000 MG CAPS, Take by mouth., Disp: , Rfl:  .  rosuvastatin (CRESTOR) 20 MG tablet, Take 1 tablet (20 mg total) by mouth daily., Disp: 90 tablet, Rfl: 3 .  psyllium (METAMUCIL) 58.6 % packet, Take 1 packet by mouth daily.  (Patient not taking: Reported on 11/30/2020), Disp: , Rfl:   Allergies  Allergen Reactions  . Prozac [Fluoxetine Hcl]     SSRIs in general cause worsened depression and suicidal ideation    History reviewed: allergies, current medications, past family history, past medical history, past social history, past surgical history and problem list  Chronic issues discussed: Recent  diagnosis of Alzheimer's-on medication, compliant  Compliant with medication for blood pressure  Compliant with medication for cholesterol  Compliant with Lexapro  No other new complaint  Drinks on average 2 beers per day, [employer]   Acute issues discussed: None    Objective:      Biometrics BP (!) 154/66   Pulse 61   Ht  5\' 11"  (1.803 m)   Wt 162 lb 9.6 oz (73.8 kg)   SpO2 97%   BMI 22.68 kg/m   BP Readings from Last 3 Encounters:  11/30/20 (!) 154/66  08/24/20 131/62  08/14/20 (!) 151/78   Wt Readings from Last 3 Encounters:  11/30/20 162 lb 9.6 oz (73.8 kg)  08/24/20 145 lb (65.8 kg)  08/14/20 144 lb 7.2 oz (65.5 kg)     Cognitive Testing  Alert? Yes  Normal Appearance?Yes  Oriented to person? Yes  Place? Yes   Time? No  Recall of three objects?  No  Can perform simple calculations? No  Displays appropriate judgment?Yes  Can read the correct time from a watch face?Yes  General appearance: alert, no distress, WD/WN, white male  Nutritional Status: Inadequate calore intake? no Loss of muscle mass? no Loss of fat beneath skin? no Localized or general edema? no Diminished functional status? no  Other pertinent exam: Scattered-no worrisome lesions HEENT: normocephalic, sclerae anicteric, TMs pearly, nares patent, no discharge or erythema, pharynx normal Oral cavity: MMM, no lesions Neck: supple, no lymphadenopathy, no thyromegaly, no masses, no bruits Heart: RRR, normal S1, S2, no murmurs Lungs: CTA bilaterally, no wheezes, rhonchi, or rales Abdomen: +bs, soft, non tender, non distended, no masses, no hepatomegaly, no splenomegaly Musculoskeletal: nontender, no swelling, no obvious deformity Extremities: no edema, no cyanosis, no clubbing Pulses: 2+ symmetric, upper and lower extremities, normal cap refill Neurological: alert, oriented x 2, CN2-12 intact, strength normal upper extremities and lower extremities, sensation normal throughout, DTRs 2+  throughout, no cerebellar signs, gait normal Psychiatric: normal affect, behavior normal, pleasant  GU/rectal-declined    Assessment:   Encounter Diagnoses  Name Primary?  . Encounter for health maintenance examination in adult Yes  . Alzheimer disease ([place])   . Essential hypertension, benign   . Medicare annual wellness visit, subsequent   . Generalized anxiety disorder   . Impaired fasting blood sugar   . Insomnia, unspecified type   . Overactive bladder   . Vaccine counseling   . Screening for prostate cancer   . Hyperlipidemia, unspecified hyperlipidemia type   . Urinary frequency   . Second hand tobacco smoke exposure   . Screen for colon cancer   . Onychomycosis   . Frequent urination   . Dyslipidemia   . Constipation, unspecified constipation type   . Bradycardia   . Benign prostatic hyperplasia with urinary frequency   . Atherosclerosis of coronary artery of native heart without angina pectoris, unspecified vessel or lesion type      Plan:   A preventative services visit was completed today.  During the course of the visit today, we discussed and counseled about appropriate screening and preventive services.  A health risk assessment was established today that included a review of current medications, allergies, social history, family history, medical and preventative health history, biometrics, and preventative screenings to identify potential safety concerns or impairments.  A personalized plan was printed today for your records and use.   Personalized health advice and education was given today to reduce health risks and promote self management and wellness.  Information regarding end of life planning was discussed today.   Today you had a preventative care visit or wellness visit.    Topics today may have included healthy lifestyle, diet, exercise, preventative care, vaccinations, sick and well care, proper use of emergency dept and after hours care, as well as  other concerns.     Recommendations: Continue to return yearly  for your annual wellness and preventative care visits.  This gives [name] a chance to discuss healthy lifestyle, exercise, vaccinations, review your chart record, and perform screenings where appropriate.  I recommend you see your eye doctor yearly for routine vision care.  I recommend you see your dentist yearly for routine dental care including hygiene visits twice yearly.  Continue routine f/u with neurology  Complete advanced directives including living will and healthcare power of attorney if you have not done this.  Please get a copy of this.   Vaccination recommendations were reviewed  You are up to date on covid vaccine You are up to date on Shingrix vaccine You are up to date on flu shot You are up to date on tetanus vaccine  I recommend a pneumococcal vaccine - call insurance to verify coverage     Screening for cancer: Colon cancer screening:  I reviewed your cologard test from 2021  We discussed PSA, prostate exam, and prostate cancer screening risks/benefits.     Skin cancer screening: Check your skin regularly for new changes, growing lesions, or other lesions of concern Come in for evaluation if you have skin lesions of concern.  Lung cancer screening: If you have a greater than 30 pack year history of tobacco use, then you qualify for lung cancer screening with a chest CT scan  We currently don't have screenings for other cancers besides breast, cervical, colon, and lung cancers.  If you have a strong family history of cancer or have other cancer screening concerns, please let me know.    Bone health: Get at least 150 minutes of aerobic exercise weekly Get weight bearing exercise at least once weekly   Heart health: Get at least 150 minutes of aerobic exercise weekly Limit alcohol It is important to maintain a healthy blood pressure and healthy cholesterol numbers   Separate significant  issues discussed: This past year was significant for him.  He had come in with his wife last year after some significant weight loss and memory changes.  Unfortunately he has been diagnosed with Alzheimer's and has started medication help with memory.  Reviewed recent neurology notes  Alzheimer's new onset-continue follow-up with neurology, continue current medication  Weight loss-resolved, reviewed labs and CT scan from this past year when we evaluated this  Hypertension-elevated today.  Sees cardiology, continue current medicine, labs today  Impaired fasting glucose-labs today  Hyperlipidemia-labs today, continue statin Crestor 20 mg daily  BPH-continue Cardura doxazosin for blood pressure and prostate  Urinary frequency and overactive bladder improved on current therapy doxazosin  Chronic constipation-no recent complaints  Atherosclerosis-continue statin cholesterol medicine       [place] was seen today for medicare wellness.  Diagnoses and all orders for this visit:  Encounter for health maintenance examination in adult -     Hemoglobin A1c -     Comprehensive metabolic panel -     CBC -     PSA -     Lipid panel  Alzheimer disease (HCC)  Essential hypertension, benign -     Comprehensive metabolic panel -     CBC  Medicare annual wellness visit, subsequent  Generalized anxiety disorder  Impaired fasting blood sugar -     Hemoglobin A1c  Insomnia, unspecified type  Overactive bladder  Vaccine counseling  Screening for prostate cancer -     PSA  Hyperlipidemia, unspecified hyperlipidemia type -     Lipid panel  Urinary frequency  Second hand tobacco smoke exposure  Screen for colon cancer  Onychomycosis  Frequent urination  Dyslipidemia  Constipation, unspecified constipation type  Bradycardia  Benign prostatic hyperplasia with urinary frequency  Atherosclerosis of coronary artery of native heart without angina pectoris, unspecified  vessel or lesion type     Follow-up pending labs, yearly for physical   Medicare Attestation A preventative services visit was completed today.  During the course of the visit the patient was educated and counseled about appropriate screening and preventive services.  A health risk assessment was established with the patient that included a review of current medications, allergies, social history, family history, medical and preventative health history, biometrics, and preventative screenings to identify potential safety concerns or impairments.  A personalized plan was printed today for the patient's records and use.   Personalized health advice and education was given today to reduce health risks and promote self management and wellness.  Information regarding end of life planning was discussed today.  Dorothea Ogle, PA-C   11/30/2020

## 2020-12-01 ENCOUNTER — Other Ambulatory Visit: Payer: Self-pay | Admitting: Medical

## 2020-12-01 ENCOUNTER — Encounter: Payer: Self-pay | Admitting: Medical

## 2020-12-01 LAB — COMPREHENSIVE METABOLIC PANEL
ALT: 35 IU/L (ref 0–44)
AST: 31 IU/L (ref 0–40)
Albumin/Globulin Ratio: 2.5 — ABNORMAL HIGH (ref 1.2–2.2)
Albumin: 4.9 g/dL — ABNORMAL HIGH (ref 3.8–4.8)
Alkaline Phosphatase: 89 IU/L (ref 44–121)
BUN/Creatinine Ratio: 9 — ABNORMAL LOW (ref 10–24)
BUN: 9 mg/dL (ref 8–27)
Bilirubin Total: 0.4 mg/dL (ref 0.0–1.2)
CO2: 23 mmol/L (ref 20–29)
Calcium: 9.8 mg/dL (ref 8.6–10.2)
Chloride: 101 mmol/L (ref 96–106)
Creatinine, Ser: 0.96 mg/dL (ref 0.76–1.27)
Globulin, Total: 2 g/dL (ref 1.5–4.5)
Glucose: 110 mg/dL — ABNORMAL HIGH (ref 65–99)
Potassium: 4.3 mmol/L (ref 3.5–5.2)
Sodium: 142 mmol/L (ref 134–144)
Total Protein: 6.9 g/dL (ref 6.0–8.5)
eGFR: 87 mL/min/{1.73_m2} (ref >59)

## 2020-12-01 LAB — PSA: Prostate Specific Ag, Serum: 1.9 ng/mL (ref 0.0–4.0)

## 2020-12-01 LAB — LIPID PANEL
Chol/HDL Ratio: 1.7 ratio (ref 0.0–5.0)
Cholesterol, Total: 189 mg/dL (ref 100–199)
HDL: 112 mg/dL (ref >39)
LDL Chol Calc (NIH): 63 mg/dL (ref 0–99)
Triglycerides: 75 mg/dL (ref 0–149)
VLDL Cholesterol Cal: 14 mg/dL (ref 5–40)

## 2020-12-01 LAB — CBC
Hematocrit: 44.4 % (ref 37.5–51.0)
Hemoglobin: 14.7 g/dL (ref 13.0–17.7)
MCH: 30.5 pg (ref 26.6–33.0)
MCHC: 33.1 g/dL (ref 31.5–35.7)
MCV: 92 fL (ref 79–97)
Platelets: 208 10*3/uL (ref 150–450)
RBC: 4.82 x10E6/uL (ref 4.14–5.80)
RDW: 13 % (ref 11.6–15.4)
WBC: 8.3 10*3/uL (ref 3.4–10.8)

## 2020-12-01 LAB — HEMOGLOBIN A1C
Est. average glucose Bld gHb Est-mCnc: 131 mg/dL
Hgb A1c MFr Bld: 6.2 % — ABNORMAL HIGH (ref 4.8–5.6)

## 2020-12-25 ENCOUNTER — Other Ambulatory Visit: Payer: Self-pay | Admitting: Medical

## 2021-01-04 ENCOUNTER — Ambulatory Visit: Payer: Medicare HMO | Admitting: Medical

## 2021-01-25 ENCOUNTER — Ambulatory Visit (INDEPENDENT_AMBULATORY_CARE_PROVIDER_SITE_OTHER): Payer: Medicare HMO | Admitting: Medical

## 2021-01-25 ENCOUNTER — Other Ambulatory Visit: Payer: Self-pay

## 2021-01-25 ENCOUNTER — Encounter: Payer: Self-pay | Admitting: Medical

## 2021-01-25 VITALS — BP 136/73 | HR 57 | Ht 71.0 in | Wt 173.0 lb

## 2021-01-25 DIAGNOSIS — E785 Hyperlipidemia, unspecified: Secondary | ICD-10-CM | POA: Diagnosis not present

## 2021-01-25 DIAGNOSIS — R350 Frequency of micturition: Secondary | ICD-10-CM

## 2021-01-25 DIAGNOSIS — Z23 Encounter for immunization: Secondary | ICD-10-CM

## 2021-01-25 DIAGNOSIS — Z7189 Other specified counseling: Secondary | ICD-10-CM | POA: Diagnosis not present

## 2021-01-25 DIAGNOSIS — Z7185 Encounter for immunization safety counseling: Secondary | ICD-10-CM | POA: Diagnosis not present

## 2021-01-25 DIAGNOSIS — I10 Essential (primary) hypertension: Secondary | ICD-10-CM

## 2021-01-25 DIAGNOSIS — N401 Enlarged prostate with lower urinary tract symptoms: Secondary | ICD-10-CM

## 2021-01-25 NOTE — Patient Instructions (Signed)
Blood pressure  Increase the Doxazosin/Cardura from 1mg  daily to 2mg  daily by double up on the pills daily for the next month  As long as no dizziness or sedation, let me know before you run out so I can send new 2mg  Cardura.    However, if side effects, let me know sooner than later so we can use a different medicaiton to augment the Cardura 1mg .   Work on advanced directive paperwork

## 2021-01-25 NOTE — Progress Notes (Signed)
Subjective:  Paul Klein is a 65 y.o. male who presents for Chief Complaint  Patient presents with  . Follow-up    Following up on blood pressure medication     Here today accompanied by wife.  Here for follow-up from March physical visit.  At his last visit his blood pressure was not at goal.  They have been checking home blood pressure readings.  They have several readings with him today.  Readings range from 115/65 - 153/86 with average about 130/80.  No chest pain no palpitations.  He saw cardiology within the past year but decided not to go through with stress test at that time.  He had weight loss last year but he has bounced back and doing fine with his weight currently.  The weight loss was around the time of his diagnosis of Alzheimer's new onset.  Hyperlipidemia-he is compliant with statin without complaint  BPH- he continues on Cardura doxazosin for blood pressure and prostate and this has been helpful to reduce urinary frequency symptoms  Last visit we discussed advanced directives.  He has not completed these yet  Last visit we discussed vaccines and he is ready for pneumonia shot today  No other aggravating or relieving factors.    No other c/o.  Past Medical History:  Diagnosis Date  . Alzheimer's disease ([place])   . Anxiety    Dr. Toy Klein  . Diverticulosis of colon    sigmoid, per colonoscopy 2014, Dr. Benson Klein  . Dyslipidemia   . Hemorrhoids    internal and externa per 2014 colonoscopy  . Insomnia    uses Neurontin ( Dr. Toy Klein)  . Normal cardiac stress test 12/2012   performed due to risk factors, abnormal EKG; Dr. Wynonia Klein  . Panic attack   . Second hand smoke exposure    wife   Current Outpatient Medications on File Prior to Visit  Medication Sig Dispense Refill  . aspirin EC 81 MG tablet Take 1 tablet (81 mg total) by mouth daily. 90 tablet 3  . doxazosin (CARDURA) 1 MG tablet TAKE 1 TABLET(1 MG) BY MOUTH DAILY 90 tablet 0  . escitalopram (LEXAPRO) 10 MG  tablet Take 1 tablet (10 mg total) by mouth 2 (two) times daily. 60 tablet 5  . gabapentin (NEURONTIN) 800 MG tablet     . melatonin 3 MG TABS tablet Take 1 tablet (3 mg total) by mouth at bedtime.  0  . memantine (NAMENDA) 10 MG tablet take 1 tablet every night for 2 weeks, then increase to 1 tab BID 60 tablet 5  . Multiple Vitamin (MULTIVITAMIN) tablet Take 1 tablet by mouth daily.    . Omega-3 Fatty Acids (FISH OIL) 1000 MG CAPS Take by mouth.    . rosuvastatin (CRESTOR) 20 MG tablet Take 1 tablet (20 mg total) by mouth daily. 90 tablet 3  . psyllium (METAMUCIL) 58.6 % packet Take 1 packet by mouth daily.  (Patient not taking: No sig reported)     No current facility-administered medications on file prior to visit.     The following portions of the patient's history were reviewed and updated as appropriate: allergies, current medications, past family history, past medical history, past social history, past surgical history and problem list.  ROS Otherwise as in subjective above  Objective: BP 136/73   Pulse (!) 57   Ht 5\' 11"  (1.803 m)   Wt 173 lb (78.5 kg)   SpO2 98%   BMI 24.13 kg/m   Wt Readings  from Last 3 Encounters:  01/25/21 173 lb (78.5 kg)  11/30/20 162 lb 9.6 oz (73.8 kg)  08/24/20 145 lb (65.8 kg)   BP Readings from Last 3 Encounters:  01/25/21 136/73  11/30/20 (!) 154/66  08/24/20 131/62    General appearance: alert, no distress, well developed, well nourished Neck: supple, no lymphadenopathy, no thyromegaly, no masses Heart: RRR, normal S1, S2, no murmurs Lungs: CTA bilaterally, no wheezes, rhonchi, or rales Pulses: 2+ radial pulses, 2+ pedal pulses, normal cap refill Ext: no edema   Assessment: Encounter Diagnoses  Name Primary?  . Essential hypertension, benign Yes  . Need for pneumococcal vaccination   . Vaccine counseling   . Hyperlipidemia, unspecified hyperlipidemia type   . Benign prostatic hyperplasia with urinary frequency   . Advance  directive discussed with patient      Plan: HTN - not at goal.  Since he has plenty of Cardura 1 mg he will increase to 2 mg daily.  They will let me know in a month if his blood pressures on average are improved and whether he is having any side effects or not.  We discussed risk and benefits of medication.  Counseled on the pneumococcal vaccine.  Vaccine information sheet given.  Pneumococcal vaccine Prevnar 13 given after consent obtained.  Hyperlipidemia-continue statin.  Reviewed labs from last visit 11/2020  BPH-symptoms improved since being on Cardura  We discussed advanced directives.  I gave him a new copy of an advanced directives form they can work on, have notarized and get back to [name].  Paul Klein was seen today for follow-up.  Diagnoses and all orders for this visit:  Essential hypertension, benign  Need for pneumococcal vaccination  Vaccine counseling  Hyperlipidemia, unspecified hyperlipidemia type  Benign prostatic hyperplasia with urinary frequency  Advance directive discussed with patient  Other orders -     Pneumococcal conjugate vaccine 13-valent    Follow up: 67mo

## 2021-02-23 ENCOUNTER — Other Ambulatory Visit: Payer: Self-pay

## 2021-02-23 ENCOUNTER — Ambulatory Visit: Payer: Medicare HMO | Admitting: Neurology

## 2021-02-23 ENCOUNTER — Encounter: Payer: Self-pay | Admitting: Neurology

## 2021-02-23 VITALS — BP 130/68 | HR 65 | Resp 18 | Ht 71.0 in | Wt 169.0 lb

## 2021-02-23 DIAGNOSIS — F0281 Dementia in other diseases classified elsewhere with behavioral disturbance: Secondary | ICD-10-CM | POA: Diagnosis not present

## 2021-02-23 DIAGNOSIS — G300 Alzheimer's disease with early onset: Secondary | ICD-10-CM | POA: Diagnosis not present

## 2021-02-23 DIAGNOSIS — F02818 Dementia in other diseases classified elsewhere, unspecified severity, with other behavioral disturbance: Secondary | ICD-10-CM

## 2021-02-23 MED ORDER — MEMANTINE HCL 10 MG PO TABS: ORAL_TABLET | ORAL | 3 refills | Status: DC

## 2021-02-23 MED ORDER — ESCITALOPRAM OXALATE 10 MG PO TABS: 10.0000 mg | ORAL_TABLET | Freq: Every day | ORAL | 3 refills | Status: DC

## 2021-02-23 MED ORDER — DONEPEZIL HCL 10 MG PO TABS: ORAL_TABLET | ORAL | 11 refills | Status: DC

## 2021-02-23 NOTE — Patient Instructions (Signed)
Good to see you!  1. Start Aricept (Donepezil) 10mg : take 1/2 tablet daily for 2 weeks, then increase to 1 tablet daily. Monitor heart rate, dizziness, nausea/diarrhea, call for any problems  2. Continue Memantine 10mg  twice a day and Lexapro 10mg  daily  3. Speak to Dr. Toy Care regarding Gabapentin  4. Follow-up in 6 months, call for any changes   FALL PRECAUTIONS: Be cautious when walking. Scan the area for obstacles that may increase the risk of trips and falls. When getting up in the mornings, sit up at the edge of the bed for a few minutes before getting out of bed. Consider elevating the bed at the head end to avoid drop of blood pressure when getting up. Walk always in a well-lit room (use night lights in the walls). Avoid area rugs or power cords from appliances in the middle of the walkways. Use a walker or a cane if necessary and consider physical therapy for balance exercise. Get your eyesight checked regularly.  FINANCIAL OVERSIGHT: Supervision, especially oversight when making financial decisions or transactions is also recommended.  HOME SAFETY: Consider the safety of the kitchen when operating appliances like stoves, microwave oven, and blender. Consider having supervision and share cooking responsibilities until no longer able to participate in those. Accidents with firearms and other hazards in the house should be identified and addressed as well.  DRIVING: Regarding driving, in patients with progressive memory problems, driving will be impaired. We advise to have someone else do the driving if trouble finding directions or if minor accidents are reported. Independent driving assessment is available to determine safety of driving.  ABILITY TO BE LEFT ALONE: If patient is unable to contact 911 operator, consider using LifeLine, or when the need is there, arrange for someone to stay with patients. Smoking is a fire hazard, consider supervision or cessation. Risk of wandering should be  assessed by caregiver and if detected at any point, supervision and safe proof recommendations should be instituted.  MEDICATION SUPERVISION: Inability to self-administer medication needs to be constantly addressed. Implement a mechanism to ensure safe administration of the medications.  RECOMMENDATIONS FOR ALL PATIENTS WITH MEMORY PROBLEMS: 1. Continue to exercise (Recommend 30 minutes of walking everyday, or 3 hours every week) 2. Increase social interactions - continue going to [place] and enjoy social gatherings with friends and family 3. Eat healthy, avoid fried foods and eat more fruits and vegetables 4. Maintain adequate blood pressure, blood sugar, and blood cholesterol level. Reducing the risk of stroke and cardiovascular disease also helps promoting better memory. 5. Avoid stressful situations. Live a simple life and avoid aggravations. Organize your time and prepare for the next day in anticipation. 6. Sleep well, avoid any interruptions of sleep and avoid any distractions in the bedroom that may interfere with adequate sleep quality 7. Avoid sugar, avoid sweets as there is a strong link between excessive sugar intake, diabetes, and cognitive impairment We discussed the Mediterranean diet, which has been shown to help patients reduce the risk of progressive memory disorders and reduces cardiovascular risk. This includes eating fish, eat fruits and green leafy vegetables, nuts like almonds and hazelnuts, walnuts, and also use olive oil. Avoid fast foods and fried foods as much as possible. Avoid sweets and sugar as sugar use has been linked to worsening of memory function.  There is always a concern of gradual progression of memory problems. If this is the case, then we may need to adjust level of care according to patient needs. Support,  both to the patient and caregiver, should then be put into place.

## 2021-02-23 NOTE — Progress Notes (Signed)
NEUROLOGY FOLLOW UP OFFICE NOTE  Paul Klein [id number] 1955-11-24  HISTORY OF PRESENT ILLNESS: I had the pleasure of seeing Paul Klein in follow-up in the neurology clinic on 02/23/2021.  The patient was last seen 6 months ago for Alzheimer's disease (early onset). He is again accompanied by his wife who helps supplement the history today. Since his last visit, he was started on Memantine 10mg  BID and Lexapro 10mg  BID for anxiety. His wife feels the BID dosing made him more depressed. He is overall stable with Lexapro 10mg  daily. He feels his memory is about the same. His wife notes it is worse. He is always asking what day it is, communication has gotten worse. He drives minimally, about a mile away with no issues. His wife now administers medications and manages finances. He is independent with dressing and bathing. There was one episode of hallucination a few months ago where he thought there was a dog in their room, this has not recurred. No paranoia. He sees psychiatrist Dr. Toy Klein and had previously been on lorazepam and gabapentin. His wife reports they were suddenly stopped with no refills given, but she is not sure why. Off the gabapentin, he was not sleeping, up every hour. She restarted gabapentin 800mg  1.5 tabs qhs (1200mg  qhs) and he now sleeps really good. No side effects. He is not taking lorazepam. He denies any headaches, dizziness, vision changes, focal numbness/tingling/weakness, bowel/bladder dysfunction, no falls.    History on Initial Assessment 02/28/2020: This is a pleasant 65 year old right-handed man with a history of hyperlipidemia, anxiety, presenting for evaluation of memory loss. He feels his memory is not that good. He feels he started noticing changes after he stopped working 4 years ago, he went into early retirement to help take Klein of his mother with dementia (who has passed away). His wife started noticing changes a little under a year. She initially thought he was just  not listening to her, but symptoms have progressively worsened. He repeats himself and would not remember conversations from 15 minutes prior. Around 4-5 months ago, he could not recall his computer password which he has had for 14 years. She had to write this down for him and he still forgot. He used to build computers in the past, but now she has to help him with the computer or with using the TV remote control. She took over finances 5 months ago, noticing that with online banking, there were 10 entries he put in that were already there. He manages his own medications without difficulties. He drives without getting lost, his wife denies any driving concerns. He has always had word-finding issues but this has worsened. His wife has noticed increased irritability, one night he threw the remote control 4 times because he got frustrated he could not get the TV to work. He gets irritated when she tries to correct him. No paranoia or hallucinations. No REM behavior disorder. He has been on gabapentin and lorazepam 1mg  BID for many years for anxiety. He denies feeling anxious a lot, his wife states this is why he takes the medication. He did not recall why he is taking the gabapentin. His hands shake when he is nervous. He denies any headaches, dizziness, diplopia, dysarthria/dysphagia, neck/back pain, focal numbness/tingling/weakness, anosmia, no falls. Sleep is good. He feels his mood is "happy when at home." His mother was diagnosed with dementia at age 42. There are other maternal family members with dementia. No history of significant head injuries.  He is not drinking alcohol, however his wife notes when their dog died in 10-Sep-2019, he was drinking for 3 months but his memory got really bad, so he stopped alcohol.  Diagnostic Data: MRI brain with and without contrast done 03/2020 did not show any acute changes. There was mild diffuse atrophy.   Neuropsychological evaluation in July 2021 indicated memory  storage problems, naming problems, and impaired verbal fluency. He also had low scores on processing speed and executive function measures. Diagnosis of mild dementia, likely due to Alzheimer's disease.    Laboratory Data: Lab Results  Component Value Date   TSH 0.844 04/29/2020     PAST MEDICAL HISTORY: Past Medical History:  Diagnosis Date  . Alzheimer's disease ([place])   . Anxiety    Dr. Toy Klein  . Diverticulosis of colon    sigmoid, per colonoscopy 2014, Dr. Benson Klein  . Dyslipidemia   . Hemorrhoids    internal and externa per 2014 colonoscopy  . Insomnia    uses Neurontin ( Dr. Toy Klein)  . Normal cardiac stress test 12/2012   performed due to risk factors, abnormal EKG; Dr. Wynonia Klein  . Panic attack   . Second hand smoke exposure    wife    MEDICATIONS: Current Outpatient Medications on File Prior to Visit  Medication Sig Dispense Refill  . aspirin EC 81 MG tablet Take 1 tablet (81 mg total) by mouth daily. 90 tablet 3  . doxazosin (CARDURA) 1 MG tablet TAKE 1 TABLET(1 MG) BY MOUTH DAILY 90 tablet 0  . escitalopram (LEXAPRO) 10 MG tablet Take 1 tablet (10 mg total) by mouth 2 (two) times daily. 60 tablet 5  . gabapentin (NEURONTIN) 800 MG tablet     . melatonin 3 MG TABS tablet Take 1 tablet (3 mg total) by mouth at bedtime.  0  . memantine (NAMENDA) 10 MG tablet take 1 tablet every night for 2 weeks, then increase to 1 tab BID 60 tablet 5  . Multiple Vitamin (MULTIVITAMIN) tablet Take 1 tablet by mouth daily.    . Omega-3 Fatty Acids (FISH OIL) 1000 MG CAPS Take by mouth.    . psyllium (METAMUCIL) 58.6 % packet Take 1 packet by mouth daily.    . rosuvastatin (CRESTOR) 20 MG tablet Take 1 tablet (20 mg total) by mouth daily. 90 tablet 3   No current facility-administered medications on file prior to visit.    ALLERGIES: Allergies  Allergen Reactions  . Prozac [Fluoxetine Hcl]     SSRIs in general cause worsened depression and suicidal ideation    FAMILY HISTORY: Family  History  Problem Relation Age of Onset  . Heart disease Mother        BYPASS HEART SURGERY  . Depression Mother   . Mental illness Mother   . Cancer Mother        skin  . Dementia Mother   . Hypertension Father   . Benign prostatic hyperplasia Father   . Cancer Father        skin  . Heart disease Father 61       CABG  . Depression Sister   . Stroke Maternal Grandmother   . Diabetes Paternal Grandfather     SOCIAL HISTORY: Social History   Socioeconomic History  . Marital status: Married    Spouse name: Not on file  . Number of children: 0  . Years of education: Not on file  . Highest education level: Not on file  Occupational History  .  Not on file  Tobacco Use  . Smoking status: Former Smoker    Packs/day: 2.00    Years: 6.00    Pack years: 12.00    Types: Cigarettes    Quit date: 1970    Years since quitting: 52.4  . Smokeless tobacco: Never Used  Vaping Use  . Vaping Use: Never used  Substance and Sexual Activity  . Alcohol use: Yes    Comment: occ  . Drug use: No    Comment: marijuana in the past  . Sexual activity: Not on file  Other Topics Concern  . Not on file  Social History Narrative   Walks the dog, does calisthenics.  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 1 grandchild lives in [place], other grandchild teenager, wants to spend time with his friends. 08/2017   Right handed    Social Determinants of Health   Financial Resource Strain: Not on file  Food Insecurity: Not on file  Transportation Needs: Not on file  Physical Activity: Not on file  Stress: Not on file  Social Connections: Not on file  Intimate Partner Violence: Not on file     PHYSICAL EXAM: Vitals:   02/23/21 1517  BP: 130/68  Pulse: 65  Resp: 18  SpO2: 95%   General: No acute distress Head:  Normocephalic/atraumatic Skin/Extremities: No rash, no edema Neurological Exam: alert and oriented to person, place. He knows  it is the 31st, but month is April, year is 36. He makes paraphasic errors when repeating words ("raisy" instead of "daisy"). Fund of knowledge is reduced.  Recent and remote memory are impaired. Attention and concentration are reduced. Unable to draw clock, he repeatedly wrote 12 and 11 (see attached). MMSE 19/30 MMSE - Mini Mental State Exam 02/23/2021 11/30/2020 04/10/2020  Orientation to time 1 1 3   Orientation to Place 5 5 4   Registration 3 3 3   Attention/ Calculation 2 2 1   Recall 0 0 0  Language- name 2 objects 2 2 2   Language- repeat 1 1 1   Language- follow 3 step command 3 3 2   Language- read & follow direction 1 1 0  Write a sentence 0 0 1  Copy design 1 1 1   Total score 19 19 18    Cranial nerves: Pupils equal, round. Extraocular movements intact with no nystagmus. Visual fields full.  No facial asymmetry.  Motor: Bulk and tone normal, muscle strength 5/5 throughout with no pronator drift.   Finger to nose testing intact.  Gait narrow-based and steady, able to tandem walk adequately.  Romberg negative.   IMPRESSION: This is a pleasant 65 yo RH man with a history of hyperlipidemia, anxiety, with Neuropsychological evaluation in 2021 indicating early onset Alzheimer's disease. MRI brain showed mild diffuse atrophy. MMSE today 19/30. He is having more difficulties as noted above. We discussed potential side effects of bradycardia with Donepezil, they would like to proceed and monitor for side effects. Start Donepezil 10mg  1/2 tablet daily for 2 weeks, then increase to 1 tablet daily. Continue Memantine 10mg  BID and Lexapro 10mg  daily. Discuss gabapentin with Dr. Toy Klein, from a neurological standpoint, no contraindications, his wife feels this helps him rest/sleep better, which is important with dementia patients. No drowsiness or seemingly worsening of cognition on medication. Continue to monitor driving. We again discussed the importance of control of vascular risk factors, physical exercise,  and brain stimulation exercises for brain health. Follow-up with our Memory Disorders PA  Sharene Butters in 6 months, they know to call for any changes.    Thank you for allowing me to participate in his Klein.  Please do not hesitate to call for any questions or concerns.   Ellouise Newer, M.D.   CC: Chana Bode, PA-C

## 2021-02-24 ENCOUNTER — Telehealth: Payer: Self-pay | Admitting: Medical

## 2021-02-24 ENCOUNTER — Other Ambulatory Visit: Payer: Self-pay | Admitting: Medical

## 2021-02-24 MED ORDER — DOXAZOSIN MESYLATE 2 MG PO TABS: 2.0000 mg | ORAL_TABLET | Freq: Every day | ORAL | 3 refills | Status: DC

## 2021-02-24 NOTE — Telephone Encounter (Signed)
Called and informed pt.  

## 2021-02-24 NOTE — Telephone Encounter (Signed)
The higher dose 2mg  sent of Cardura

## 2021-02-24 NOTE — Telephone Encounter (Signed)
Pt wife called and states that medicine doxazosin works and that pt is taking 2 a day, Please send a refill to [facility], [place] - [name]

## 2021-02-27 ENCOUNTER — Other Ambulatory Visit: Payer: Self-pay | Admitting: Neurology

## 2021-03-03 ENCOUNTER — Telehealth: Payer: Self-pay | Admitting: Neurology

## 2021-03-03 NOTE — Telephone Encounter (Signed)
Pt called left a voice mail to call the office back

## 2021-03-03 NOTE — Progress Notes (Signed)
Chief Complaint  Patient presents with   Delusional    Over the past few days he has been delusional. Called [facility] was advised to go to ER-patient refused. Nurse there advised to follow up with PCP as could be UTI. Started on 5mg  donepezil last week and could be having side effect.     Patient presents for evaluation for UTI, in the setting of worsening confusion, seemed paranoid.  Also noted to be drinking more beer. He drinks 2-3 beers (up to 4 per wife) most days.  He tends to drink earlier in the day (mid-day), sometimes out of boredom.  The other night he came in, kept repeating "what's going on".  She didn't know what this was in reference to, she thought he may have thought other people were around, seemed combination of paranoia and confusion, reports that he can get obsessed with things.  He is under the care of Dr. Delice Lesch for Alzheimer's dementia, who recently started him on Donepezil (on 02/23/21), in addition to [place]. He has been under the care of Dr. Toy Care, and is on Lexapro (thought Dr. Willeen Niece has been prescribing this recently), and had been on gabapentin (qHS for sleep). Wife admits that she has been giving him her gabapentin, as he had stopped getting it from Dr. Toy Care, but wife reports that Dr. Delice Lesch didn't have a problem with him taking this (and to d/w Dr. Toy Care).  PMH, PSH, SH reviewed  Outpatient Encounter Medications as of 03/04/2021  Medication Sig   aspirin EC 81 MG tablet Take 1 tablet (81 mg total) by mouth daily.   donepezil (ARICEPT) 10 MG tablet Take 1/2 tablet daily for 2 weeks, then increase to 1 tablet daily   doxazosin (CARDURA) 2 MG tablet Take 1 tablet (2 mg total) by mouth daily.   escitalopram (LEXAPRO) 10 MG tablet Take 1 tablet (10 mg total) by mouth daily.   gabapentin (NEURONTIN) 800 MG tablet Take 1,200 mg by mouth at bedtime.   melatonin 3 MG TABS tablet Take 1 tablet (3 mg total) by mouth at bedtime.   memantine (NAMENDA) 10 MG tablet take 1  tab twice a day   Multiple Vitamin (MULTIVITAMIN) tablet Take 1 tablet by mouth daily.   Omega-3 Fatty Acids (FISH OIL) 1000 MG CAPS Take by mouth.   psyllium (METAMUCIL) 58.6 % packet Take 1 packet by mouth daily.   rosuvastatin (CRESTOR) 20 MG tablet Take 1 tablet (20 mg total) by mouth daily.   No facility-administered encounter medications on file as of 03/04/2021.   Allergies  Allergen Reactions   Prozac [Fluoxetine Hcl]     SSRIs in general cause worsened depression and suicidal ideation   ROS: He is not having any dysuria, frequency, abdominal pain, flank pain, fever, chills, n/v/d, or other concerns.  Wife stopped giving the donepezil for now.  Moods overall have been good, +confusion, stable.  Hasn't noticed fluctuation in mental status/delirium, anger, or recurrent paranoia.  PHYSICAL EXAM: BP (!) 150/80   Pulse (!) 56   Temp (!) 97.5 F (36.4 C) (Tympanic)   Ht 5\' 11"  (1.803 m)   Wt 173 lb 9.6 oz (78.7 kg)   BMI 24.21 kg/m   132/70 on repeat by MD. Pleasant, confused male, in no distress. He is alert. Has some trouble answering questions, deflects, changes topic, some sense of humor in discussing alcohol intake. HEENT: conjunctiva and sclera are clear, EOMI, wearing mask Neck: No lymphadenopathy, thyromegaly or mass Heart: regular rate and rhythm  Lungs: clear bilaterally Back: no CVA tenderness Abdomen: soft, nontender Extremities: no edema  Urine dip: SG 1.030, negative blood, nitrite, leuks, trace protein  ASSESSMENT/PLAN:  Confusion - had episode of increased confusion, paranoia.  Was sent to r/o UTI. No e/o UTI. May re-try donepezil. To limit alcohol and stay well hydrated - Plan: POCT Urinalysis DIP (Proadvantage Device)  Alzheimer disease ([place]) - under care of Dr. Willeen Niece, on namenda. ?SE from donepezil. Will re-try   There was no evidence of any urinary tract infection. Your urine was very concentrated (meaning you're likely dehydrated). Please drink more  water and less beer. Goal is 6-8 glasses of non-alcoholic, non-caffeinated beverages per day (doesn't have to be water, could be juice, etc), and drink an extra glass of water for every beer you have. Stay well hydrated (drink more water) when outside in the heat. Try and limit to no more than 2 beers/day.  The goal is to have very pale yellow or clear urine.

## 2021-03-03 NOTE — Telephone Encounter (Signed)
Pls have her stop medication, if no change off medication, would contact PCP to make sure there is no UTI causing worsening confusion. Thanks

## 2021-03-04 ENCOUNTER — Ambulatory Visit (INDEPENDENT_AMBULATORY_CARE_PROVIDER_SITE_OTHER): Payer: Medicare HMO | Admitting: Family Medicine

## 2021-03-04 ENCOUNTER — Other Ambulatory Visit: Payer: Self-pay

## 2021-03-04 ENCOUNTER — Encounter: Payer: Self-pay | Admitting: Family Medicine

## 2021-03-04 VITALS — BP 132/70 | HR 56 | Temp 97.5°F | Ht 71.0 in | Wt 173.6 lb

## 2021-03-04 DIAGNOSIS — F0280 Dementia in other diseases classified elsewhere without behavioral disturbance: Secondary | ICD-10-CM | POA: Diagnosis not present

## 2021-03-04 DIAGNOSIS — G309 Alzheimer's disease, unspecified: Secondary | ICD-10-CM

## 2021-03-04 DIAGNOSIS — R410 Disorientation, unspecified: Secondary | ICD-10-CM | POA: Diagnosis not present

## 2021-03-04 LAB — POCT URINALYSIS DIP (PROADVANTAGE DEVICE)
Bilirubin, UA: NEGATIVE
Blood, UA: NEGATIVE
Glucose, UA: NEGATIVE mg/dL
Leukocytes, UA: NEGATIVE
Nitrite, UA: NEGATIVE
Specific Gravity, Urine: 1.03
Urobilinogen, Ur: NEGATIVE
pH, UA: 6 (ref 5.0–8.0)

## 2021-03-04 NOTE — Telephone Encounter (Signed)
Pt wife called back no answer left a voice mail to call back

## 2021-03-04 NOTE — Telephone Encounter (Signed)
LVM-to pt wife (Diane) to call the office back regarding the medication.

## 2021-03-04 NOTE — Telephone Encounter (Signed)
Patient's wife Diane returned call to nurse.

## 2021-03-04 NOTE — Telephone Encounter (Signed)
Spoke to pt wife--to stop pt medication Rx denepezil by Dr. Delice Lesch and instructions. Pt wife stated --pt seen PCP today to get tested for UTI.

## 2021-03-04 NOTE — Patient Instructions (Signed)
  There was no evidence of any urinary tract infection. Your urine was very concentrated (meaning you're likely dehydrated). Please drink more water and less beer. Goal is 6-8 glasses of non-alcoholic, non-caffeinated beverages per day (doesn't have to be water, could be juice, etc), and drink an extra glass of water for every beer you have. Stay well hydrated (drink more water) when outside in the heat. Try and limit to no more than 2 beers/day.  The goal is to have very pale yellow or clear urine.

## 2021-03-11 ENCOUNTER — Encounter: Payer: Self-pay | Admitting: Family Medicine

## 2021-03-11 ENCOUNTER — Other Ambulatory Visit: Payer: Self-pay

## 2021-03-11 ENCOUNTER — Ambulatory Visit (INDEPENDENT_AMBULATORY_CARE_PROVIDER_SITE_OTHER): Payer: Medicare HMO | Admitting: Family Medicine

## 2021-03-11 VITALS — BP 132/84 | HR 60 | Temp 98.0°F | Wt 174.4 lb

## 2021-03-11 DIAGNOSIS — M25552 Pain in left hip: Secondary | ICD-10-CM | POA: Diagnosis not present

## 2021-03-11 MED ORDER — MELOXICAM 15 MG PO TABS: 15.0000 mg | ORAL_TABLET | Freq: Every day | ORAL | 0 refills | Status: DC

## 2021-03-11 NOTE — Progress Notes (Signed)
Chief Complaint  Patient presents with   other    Lt. Leg pain upper leg burning started a few months ago gotten worse last few days.    Patient presents accompanied by his wife, with complaint of L hip pain.  He has pain in his left lateral hip with walking.  Has had some symptoms for a few months, but pain has gotten much worse in the last few days.  Denies any injury or trauma or change in activity. He woke up complaining of pain last night.  Wife has him taking 800mg  ibuprofen 2-3 times/day, with food, for the last 3 days.  It does seem to help him for a while, but it wears off after 6-8 hours.  No knee pain. Denies back pain, numbness or tingling. Hurt his back 6 months ago, resolved. Denies any weakness.  He was recently seen with some changes in behavior, paranoia. Wife reports he is back to baseline since stopping donepezil.  PMH, PSH, SH reviewed   Immunization History  Administered Date(s) Administered   Fluad Quad(high Dose 65+) 07/18/2020   Influenza Whole 08/02/2007, 08/21/2011   Influenza,inj,Quad PF,6+ Mos 08/03/2015, 08/30/2016, 07/18/2018   PFIZER(Purple Top)SARS-COV-2 Vaccination 12/04/2019, 12/30/2019, 07/01/2020   Pneumococcal Conjugate-13 01/25/2021   Tdap 07/31/2014   Zoster Recombinat (Shingrix) 10/08/2019, 03/06/2020    Allergies  Allergen Reactions   Prozac [Fluoxetine Hcl]     SSRIs in general cause worsened depression and suicidal ideation   ROS: no further paranoia, acting back to baseline, since stopping aricept. No fever, chills, URI symptoms, headaches, numbness, tingling or weakness.  +dementia. Hip pain, no other joint pains. See HPI.  PHYSICAL EXAM:  BP 132/84   Pulse 60   Temp 98 F (36.7 C)   Wt 174 lb 6.4 oz (79.1 kg)   BMI 24.32 kg/m   Well appearing, pleasant male, in good spirits, laughing. He isn't in any distress HEENT: conjunctiva and sclera are clear, EOMI, wearing mask Heart: regular rate and rhythm Lungs: clear  bilaterally Back: spine nontender Extremities: FROM hips, no pain. Area of discomfort is lateral hip, at trochanteric bursa, though nontender currently. Slight discomfort with IT band stretch, no focal tenderness No pain with pyriformis stretch Neuro: normal strength, sensation, gait.  ASSESSMENT/PLAN:  Left hip pain - asymptomatic currently, NT on exam. Suspect partially treated trochanteric bursitis.  Rec course of NSAIDs. f/u if not resolving - Plan: meloxicam (MOBIC) 15 MG tablet  Stop taking ibuprofen/advil/motrin. Start taking Meloxicam 15 mg once daily with food. Take this daily until your pain has completely resolved.  Generally this will 7-10 days.  Do not use ibuprofen/advil/motrin/aleve/naproxen/Goody or BC powder while taking the meloxicam.   You MAY use tylenol (plain or extra strength) as needed for pain. You can use topical medications, if needed--Biofreeze, [employer], SalonPas with lidocaine; you can also use heat or ice, whichever helps the most.  If you pain isn't resolved after taking 2 weeks of medication, return for re-evaluation.  Depending on your exam, we can consider a cortisone shot (if bursitis) vs referral for physical therapy.

## 2021-03-11 NOTE — Patient Instructions (Signed)
Given that you didn't have any reproducible pain on today's exam, it is hard to say exactly what is causing your pain.  I suspect based on the location that pain is due to bursitis. See information below.  Stop taking ibuprofen/advil/motrin. Start taking Meloxicam 15 mg once daily with food. Take this daily until your pain has completely resolved.  Generally this will 7-10 days.  Do not use ibuprofen/advil/motrin/aleve/naproxen/Goody or BC powder while taking the meloxicam.   You MAY use tylenol (plain or extra strength) as needed for pain. You can use topical medications, if needed--Biofreeze, [employer], SalonPas with lidocaine; you can also use heat or ice, whichever helps the most.  If you pain isn't resolved after taking 2 weeks of medication, return for re-evaluation.  Depending on your exam, we can consider a cortisone shot (if bursitis) vs referral for physical therapy.  Hip Bursitis  Hip bursitis is swelling of one or more fluid-filled sacs (bursae) in your hip joint. This condition can cause pain, and your symptoms may comeand go over time. What are the causes? Repeated use of your hip muscles. Injury to the hip. Weak butt muscles. Bone spurs. Infection. In some cases, the cause may not be known. What increases the risk? You are more likely to develop this condition if: You had a past hip injury or hip surgery. You have a condition, such as arthritis, gout, diabetes, or thyroid disease. You have spine problems. You have one leg that is shorter than the other. You run a lot or do long-distance running. You play sports where there is a risk of injury or falling, such as football, martial arts, or skiing. What are the signs or symptoms? Symptoms may come and go, and they often include: Pain in the hip or groin area. Pain may get worse when you move your hip. Tenderness and swelling of the hip. In rare cases, the bursa may become infected. If this happens, you may get afever, as  well as have warmth and redness in the hip area. How is this treated? This condition is treated by: Resting your hip. Icing your hip. Wrapping the hip area with an elastic bandage (compression wrap). Keeping the hip raised. Other treatments may include medicine, draining fluid out of the bursa, orusing crutches, a cane, or a walker. Surgery may be needed, but this is rare. Long-term treatment may include doing exercises to help your strength and flexibility. It may also include lifestyle changes like losing weight to lessenthe strain on your hip. Follow these instructions at home: Managing pain, stiffness, and swelling     If told, put ice on the painful area. Put ice in a plastic bag. Place a towel between your skin and the bag. Leave the ice on for 20 minutes, 2-3 times a day. Raise your hip by putting a pillow under your hips while you lie down. Stop if you feel pain. If told, put heat on the affected area. Do this as often as told by your doctor. Use a moist heat pack or a heating pad as told by your doctor. Place a towel between your skin and the heat source. Leave the heat on for 20-30 minutes. Take off the heat if your skin turns bright red. This is very important if you are unable to feel pain, heat, or cold. You may have a greater risk of getting burned. Activity Do not use your hip to support your body weight until your doctor says that you can. Use crutches, a cane,  or a walker as told by your doctor. If the affected leg is one that you use to drive, ask your doctor if it is safe to drive. Rest and protect your hip as much as you can until you feel better. Return to your normal activities as told by your doctor. Ask your doctor what activities are safe for you. Do exercises as told by your doctor. General instructions Take over-the-counter and prescription medicines only as told by your doctor. Gently rub and stretch your injured area as often as is comfortable. Wear  elastic bandages only as told by your doctor. If one of your legs is shorter than the other, get fitted for a shoe insert or orthotic. Keep a healthy weight. Follow instructions from your doctor. Keep all follow-up visits as told by your doctor. This is important. How is this prevented? Exercise regularly, as told by your doctor. Wear the right shoes for the sport you play. Warm up and stretch before being active. Cool down and stretch after being active. Take breaks often from repeated activity. Avoid activities that bother your hip or cause pain. Avoid sitting down for a long time. Where to find more information American Academy of Orthopa[employer]geons: orthoinfo.aaos.org Contact a doctor if: You have a fever. You have new symptoms. You have trouble walking or doing everyday activities. You have pain that gets worse or does not get better with medicine. Your skin around your hip is red. You get a feeling of warmth in your hip area. Get help right away if: You cannot move your hip. You have very bad pain. You cannot control the muscles in your feet. Summary Hip bursitis is swelling of one or more fluid-filled sacs (bursae) in your hip joint. Symptoms often come and go over time. This condition is often treated by resting and icing the hip. It also may help to keep the area raised and wrapped in an elastic bandage. Other treatments may be needed. This information is not intended to replace advice given to you by your health care provider. Make sure you discuss any questions you have with your healthcare provider. Document Revised: 07/15/2019 Document Reviewed: 05/21/2018 Elsevier Patient Education  2022 [employer].

## 2021-03-25 ENCOUNTER — Other Ambulatory Visit: Payer: Self-pay | Admitting: Medical

## 2021-03-25 NOTE — Telephone Encounter (Signed)
Pt was increased to 2mg  from 1mg 

## 2021-03-31 NOTE — Progress Notes (Signed)
Chief Complaint  Patient presents with   other    Lt. Leg pain mainly in the hip area, pain is a little worse now or about the same.     Patient was last seen 6/16 with complaints of L hip pain.  He had been taking ibuprofen prior to visit, and that had helped some. Area of pain had been at the trochanteric bursa, but was nontender at time of last exam, suspected that it was partially treated with the ibuprofen.  We had him switch from ibuprofen to Meloxicam 15mg  daily.  He was advised to follow-up if symptoms didn't resolve--to consider cortisone injection (if appropriate based on exam), vs referral for physical therapy.  He presents today, accompanied by his wife, with complaint of ongoing left hip pain.  He still needed some tylenol, even when taking the meloxicam. He has been out of meloxicam for about a week. They report that the pain varies.  It is worse at night--needs to get up 30-60 minutes after going to bed; walking around sometimes helps a little. Pain is at the L lateral hip  PMH, PSH, SH reviewed  Outpatient Encounter Medications as of 04/01/2021  Medication Sig   aspirin EC 81 MG tablet Take 1 tablet (81 mg total) by mouth daily.   donepezil (ARICEPT) 10 MG tablet Take 1/2 tablet daily for 2 weeks, then increase to 1 tablet daily   doxazosin (CARDURA) 2 MG tablet Take 1 tablet (2 mg total) by mouth daily.   escitalopram (LEXAPRO) 10 MG tablet Take 1 tablet (10 mg total) by mouth daily.   gabapentin (NEURONTIN) 800 MG tablet Take 1,200 mg by mouth at bedtime.   melatonin 3 MG TABS tablet Take 1 tablet (3 mg total) by mouth at bedtime.   meloxicam (MOBIC) 15 MG tablet Take 1 tablet (15 mg total) by mouth daily. Take until your pain is resolved   memantine (NAMENDA) 10 MG tablet take 1 tab twice a day   Multiple Vitamin (MULTIVITAMIN) tablet Take 1 tablet by mouth daily.   Omega-3 Fatty Acids (FISH OIL) 1000 MG CAPS Take by mouth.   psyllium (METAMUCIL) 58.6 % packet Take 1  packet by mouth daily.   rosuvastatin (CRESTOR) 20 MG tablet Take 1 tablet (20 mg total) by mouth daily.   No facility-administered encounter medications on file as of 04/01/2021.   Allergies  Allergen Reactions   Prozac [Fluoxetine Hcl]     SSRIs in general cause worsened depression and suicidal ideation   ROS:  no fever, chills, URI symptoms, numbness, tingling, weakness, chest pain, abdominal pain or other complaints, except as noted in HPI    PHYSICAL EXAM:  BP 124/72   Pulse 61   Temp 98 F (36.7 C)   Wt 172 lb 9.6 oz (78.3 kg)   BMI 24.07 kg/m   Pleasant male, in no distress Extremities:  Nontender at trochanteric bursa on left.  No reproducible tenderness. He did have pain at L buttock area with pyriformis stretch.  No pain on the right side with same stretch. Back: no spinal tenderness, muscle spasm, CVA tenderness Psych: normal mood, affect, hygiene and grooming   ASSESSMENT/PLAN:  Left hip pain - exam not c/w bursitis.  Has pain with pyriformis stretch. Shown stretches.  Refer for PT - Plan: Ambulatory referral to Physical Therapy

## 2021-04-01 ENCOUNTER — Encounter: Payer: Self-pay | Admitting: Family Medicine

## 2021-04-01 ENCOUNTER — Other Ambulatory Visit: Payer: Self-pay

## 2021-04-01 ENCOUNTER — Ambulatory Visit (INDEPENDENT_AMBULATORY_CARE_PROVIDER_SITE_OTHER): Payer: Medicare HMO | Admitting: Family Medicine

## 2021-04-01 VITALS — BP 124/72 | HR 61 | Temp 98.0°F | Wt 172.6 lb

## 2021-04-01 DIAGNOSIS — M25552 Pain in left hip: Secondary | ICD-10-CM | POA: Diagnosis not present

## 2021-04-01 NOTE — Patient Instructions (Signed)
We are referring you to physical therapy for further evaluation and treatment of your left hip pain. There wasn't any good evidence of a bursitis to treat with an injection today. You did have pain with the stretch with your left ankle crossed on top of your right knee. Try and do this stretch at least 2x/day, after applying heat (heating pad or shower), with gentle pressure to the left knee, giving a good stretch of the deep muscles in the buttock.  Continue with tylenol if needed for pain. You can also try topical medications such as Biofreeze, or voltaren gel, or topical lidocaine (salonpas with lidocane).

## 2021-04-20 ENCOUNTER — Telehealth: Payer: Self-pay | Admitting: Neurology

## 2021-04-20 NOTE — Telephone Encounter (Signed)
Pt wife called in and would like aquino to know the medicine menantine is not working for curtiss. She would like to know if he can be put on ativan. She said he has taken that before and it helped. His anxiety is getting much worse and she cant take it. He is obsessing with things. She would like to know if aquino would prescribe gabapentin. She has had a convo with her about this. Dr. Toy Care will not refill. Wife would like a  call back [phone]

## 2021-04-21 MED ORDER — GABAPENTIN 800 MG PO TABS: 1200.0000 mg | ORAL_TABLET | Freq: Every day | ORAL | 6 refills | Status: DC

## 2021-04-21 NOTE — Telephone Encounter (Signed)
Spoke with pt wife she stated that he takes 1200 mg daily of Gabapentin, she will talk to Dr Toy Care about the Ativan. She said after reading the side effects of the mamantine she doesn't want him to take it she said that he has been seeing people while on it. Uses pharmacy on file

## 2021-04-21 NOTE — Telephone Encounter (Signed)
Rx for gabapentin '800mg'$ : take 1.5 tabs qhs sent. Ok to stop Memantine. Thanks

## 2021-04-21 NOTE — Telephone Encounter (Signed)
Pls let wife know I can prescribe the gabapentin (pls confirm dose), but she will have to talk to Dr. Toy Care about the anxiety and other options, we cannot prescribe Ativan. If she wants a referral to a different psychiatrist, can send referral to Behavioral health for anxiety. The memantine is not for anxiety, it can help slow down worsening of memory loss and would not help him remember more. Thanks

## 2021-04-21 NOTE — Addendum Note (Signed)
Addended by: Cameron Sprang on: 04/21/2021 02:01 PM   Modules accepted: Orders

## 2021-04-21 NOTE — Telephone Encounter (Signed)
Pt wife informed Rx for gabapentin '800mg'$ : take 1.5 tabs qhs sent. Ok to stop Memantine, she verbalized understanding

## 2021-05-13 ENCOUNTER — Telehealth: Payer: Self-pay | Admitting: Physician Assistant

## 2021-05-13 NOTE — Telephone Encounter (Signed)
Pt's wife called in stating the patient is restless and gets obsessed with things. She stated "he is a mess". She said she has been reading up on alzheimers and she feels he is at stage 6 out of 7. She would like to know what she can do?

## 2021-05-13 NOTE — Telephone Encounter (Signed)
Pt stopped Memantine 04/20/21 due to hallucinations, pt wife asking for something to help calm him down, she stated its been a great change in him.  She said that she would like for him to have a scan, or have an appointment an be tested again. She said that it is very hard on her as well as him, she is asking for help

## 2021-05-14 NOTE — Telephone Encounter (Signed)
Spoke with pt wife and informed her that Dr. Delice Lesch will be back on Monday, will discuss with her for other mood medicines. If symptoms worsen, or if confusion and agitations increases, may need to go to the ER for lab and other testing, rule out underlying infection.

## 2021-05-18 ENCOUNTER — Telehealth: Payer: Self-pay | Admitting: Physician Assistant

## 2021-05-18 NOTE — Telephone Encounter (Signed)
Patient is no longer being seen there, they did not help him. Please readvise.

## 2021-05-18 NOTE — Telephone Encounter (Signed)
Patient is having hallucinations, anxious, progressively worseing. Wife is calling to report this, she is requesting to help immediately.

## 2021-05-18 NOTE — Telephone Encounter (Signed)
Paul Klein, pls call wife and let her know we can try Depakote as a mood stabilizer. Main side effect is drowsiness. If she would like to try, pls send Rx for Depakote ER '250mg'$  qhs. Thanks

## 2021-05-18 NOTE — Telephone Encounter (Signed)
Pt wife called in to speak with heather regarding her husbands anxiety

## 2021-05-19 NOTE — Telephone Encounter (Signed)
Pt is returning call to christy regarding her husband Paul Klein

## 2021-05-19 NOTE — Telephone Encounter (Signed)
Left message to call office regarding medication, see message from [place].

## 2021-05-20 ENCOUNTER — Other Ambulatory Visit: Payer: Self-pay | Admitting: Neurology

## 2021-05-20 ENCOUNTER — Other Ambulatory Visit: Payer: Self-pay

## 2021-05-20 MED ORDER — DIVALPROEX SODIUM ER 250 MG PO TB24: 250.0000 mg | ORAL_TABLET | Freq: Every evening | ORAL | 1 refills | Status: DC

## 2021-05-20 NOTE — Telephone Encounter (Signed)
Patient wife is gong to talk to her pharmacist and call back.

## 2021-05-20 NOTE — Telephone Encounter (Signed)
Patient's wife called and left a voice mail requesting the patient's Depakote be sent to [facility] in [place].

## 2021-05-20 NOTE — Telephone Encounter (Signed)
I sent rx in for depakote er '250mg'$  1 tab po qhs. Family to report status.

## 2021-06-01 NOTE — Telephone Encounter (Signed)
Please see. Thank you

## 2021-06-01 NOTE — Telephone Encounter (Signed)
Patient's wife called in stating the Depakote is not working. It has been making his symptoms worse. He is hallucinating more and thought someone was in the house trying to kill him yesterday.

## 2021-06-02 ENCOUNTER — Telehealth: Payer: Self-pay | Admitting: Medical

## 2021-06-02 ENCOUNTER — Ambulatory Visit (INDEPENDENT_AMBULATORY_CARE_PROVIDER_SITE_OTHER): Payer: Medicare HMO | Admitting: Medical

## 2021-06-02 ENCOUNTER — Other Ambulatory Visit: Payer: Self-pay

## 2021-06-02 VITALS — BP 122/80 | HR 76 | Wt 175.0 lb

## 2021-06-02 DIAGNOSIS — F0280 Dementia in other diseases classified elsewhere without behavioral disturbance: Secondary | ICD-10-CM

## 2021-06-02 DIAGNOSIS — R443 Hallucinations, unspecified: Secondary | ICD-10-CM

## 2021-06-02 DIAGNOSIS — G309 Alzheimer's disease, unspecified: Secondary | ICD-10-CM | POA: Diagnosis not present

## 2021-06-02 DIAGNOSIS — K644 Residual hemorrhoidal skin tags: Secondary | ICD-10-CM | POA: Insufficient documentation

## 2021-06-02 DIAGNOSIS — K6289 Other specified diseases of anus and rectum: Secondary | ICD-10-CM | POA: Diagnosis not present

## 2021-06-02 DIAGNOSIS — F419 Anxiety disorder, unspecified: Secondary | ICD-10-CM | POA: Diagnosis not present

## 2021-06-02 DIAGNOSIS — F0390 Unspecified dementia without behavioral disturbance: Secondary | ICD-10-CM

## 2021-06-02 LAB — POCT URINALYSIS DIP (PROADVANTAGE DEVICE)
Bilirubin, UA: NEGATIVE
Blood, UA: NEGATIVE
Glucose, UA: NEGATIVE mg/dL
Ketones, POC UA: NEGATIVE mg/dL
Leukocytes, UA: NEGATIVE
Nitrite, UA: NEGATIVE
Protein Ur, POC: NEGATIVE mg/dL
Specific Gravity, Urine: 1.015
Urobilinogen, Ur: NEGATIVE
pH, UA: 6 (ref 5.0–8.0)

## 2021-06-02 MED ORDER — HYDROCORTISONE 2.5 % EX CREA: TOPICAL_CREAM | Freq: Two times a day (BID) | CUTANEOUS | 0 refills | Status: DC

## 2021-06-02 MED ORDER — QUETIAPINE FUMARATE 25 MG PO TABS: 25.0000 mg | ORAL_TABLET | Freq: Two times a day (BID) | ORAL | 1 refills | Status: DC

## 2021-06-02 NOTE — Telephone Encounter (Signed)
Hello Dr. Delice Lesch  I saw Mr. Mashak today along with his wife.  As you know he has memory issues but lately wife notes that he stays anxious, paces, is having frequent hallucinations.   Early in the morning sometimes he "sees people in his bedroom trying to kill him," or people trying to break in.  I was curious if you have some medication suggestions that you like to use for hallucinations and anxiety in his situation.  I wanted to get your help on this.     Thanks

## 2021-06-02 NOTE — Telephone Encounter (Signed)
Pls let her know Seroquel is one of the medications we typically prescribe for dementia and hallucinations, agitation. He will be started on a low dose and dose can be increased if needed. We avoid benzodiazepines in dementia patients because they can cause more confusion and falls.

## 2021-06-02 NOTE — Telephone Encounter (Signed)
Pt's daughter called in stating that her mother was told they were going to try Seroquel. She states that is not going to work. She is worried about her mother being very stressed and barely sleeping due to the patient's behavior. She wants to know why a "benzo" cannot be prescribed. The patient does not sit still and his obsessions are constant, which cause him anxiety.

## 2021-06-02 NOTE — Addendum Note (Signed)
Addended by: Carlena Hurl on: 06/02/2021 02:39 PM   Modules accepted: Orders

## 2021-06-02 NOTE — Telephone Encounter (Signed)
Pt's daughter Antionio Schmelzle called She states you had a visit with her father earlier  She would like to talk to you, she states her father is extremely bad and he paces constantly She feels he needs something for anxiety, she knows they don't normally like to give alzheimers pts anxiety meds but she states he needs something and she is also concerned for her mother there taking care of him

## 2021-06-02 NOTE — Telephone Encounter (Signed)
Pt's daughter called in wanting to see if there is any type of anxiety medication that the patient could be put on? They have had the patient stop taking the Depakote due to the hallucinations getting worse.

## 2021-06-02 NOTE — Telephone Encounter (Signed)
Hi Shane,  He should really be seeing his psychiatrist for this, I'm not sure why wife feels Dr. Toy Care is not helping. In the meantime, can treat him as any patient with hallucinations and anxiety, can try Seroquel, discussing cardiac black box and benefits outweighing risks with wife. Make sure EKG done recently with no prolonged QTc.   [facility]

## 2021-06-02 NOTE — Progress Notes (Addendum)
Subjective:  Paul Klein is a 65 y.o. male who presents for Chief Complaint  Patient presents with   med check    Med check. Having hallunications a lot- would like to be checked UTI, bottom hurts constantly     Here with wife Diane.  She provides a lot of the history but he does add his concerns as well.  He has a history of dementia, early Alzheimer's.  He is seeing neurology.  Lately wife is concerned about his anxiety and hallucinations.  He paces around the house, he always seems to be anxious, cannot sit still.  Lately he has had frequent hallucinations, daily.  For example early in the morning he will wake his wife up that he sees someone in the room trying to kill him.  Other times he hallucinates thinking someone is trying to break in the house.  Wife notes that he woke call object that he is holding something other than what it really is, confused.  He does not have any specific hobbies.  Wife has alarm on the front and back door the event he left the house she would know right away.  They had guns in the home before his dementia diagnosis.  They have all been taken out now.  He often will lose items.  For example he may be wearing a pair of glasses thinking he lost his glasses but have another pair of glasses on his lap.  He takes melatonin to help his sleep.  He is complaining about irritation or pain at his rectum.  No blood in the stool, no obvious tear or cut.  He has a daily bowel movement.  Sometimes the feces may feel large.  He denies constipation or bloating.  Wife is using cream to help.  She wondered about UTI as neurology told her sometimes people may have UTI if acting strange or different.  He report no urinary c/o.  No other aggravating or relieving factors.    No other c/o.  Past Medical History:  Diagnosis Date   Alzheimer's disease ([place])    Anxiety    Dr. Toy Care   Diverticulosis of colon    sigmoid, per colonoscopy 2014, Dr. Benson Norway   Dyslipidemia     Hemorrhoids    internal and externa per 2014 colonoscopy   Insomnia    uses Neurontin ( Dr. Toy Care)   Normal cardiac stress test 12/2012   performed due to risk factors, abnormal EKG; Dr. Wynonia Lawman   Panic attack    Second hand smoke exposure    wife   Current Outpatient Medications on File Prior to Visit  Medication Sig Dispense Refill   aspirin EC 81 MG tablet Take 1 tablet (81 mg total) by mouth daily. 90 tablet 3   doxazosin (CARDURA) 2 MG tablet Take 1 tablet (2 mg total) by mouth daily. 90 tablet 3   escitalopram (LEXAPRO) 10 MG tablet Take 1 tablet (10 mg total) by mouth daily. 90 tablet 3   gabapentin (NEURONTIN) 800 MG tablet Take 1.5 tablets (1,200 mg total) by mouth at bedtime. 45 tablet 6   melatonin 3 MG TABS tablet Take 1 tablet (3 mg total) by mouth at bedtime.  0   Multiple Vitamin (MULTIVITAMIN) tablet Take 1 tablet by mouth daily.     Omega-3 Fatty Acids (FISH OIL) 1000 MG CAPS Take by mouth.     rosuvastatin (CRESTOR) 20 MG tablet Take 1 tablet (20 mg total) by mouth daily. 90 tablet 3  psyllium (METAMUCIL) 58.6 % packet Take 1 packet by mouth daily. (Patient not taking: Reported on 06/02/2021)     No current facility-administered medications on file prior to visit.    The following portions of the patient's history were reviewed and updated as appropriate: allergies, current medications, past family history, past medical history, past social history, past surgical history and problem list.  ROS Otherwise as in subjective above  Objective: BP 122/80   Pulse 76   Wt 175 lb (79.4 kg)   BMI 24.41 kg/m   General appearance: alert, no distress, well developed, well nourished Anus with external hemorrhoids somewhat swollen but small, no fissure, no obvious bleeding Psych: He answered questions but he seems to really have declined over the last year and a half where he has very short-term memory.  Apparently he had been agitated in the front office today and was pacing  about the front office and was escorted to the patient room by our staff    Assessment: Encounter Diagnoses  Name Primary?   Dementia without behavioral disturbance, unspecified dementia type ([place]) Yes   Alzheimer disease ([place])    External hemorrhoid    Rectal discomfort    Hallucination    Anxiety      Plan: Dementia with recent worsening of anxiety and hallucinations - we discussed concerns.  I asked him to stop melatonin as it is not helping and may be contributing to symptoms  Urinalysis normal today  I will communicate with neurology on medications to help with anxiety and hallucinations.  Rectal discomfit, external hemorrhoids - advised soapy hot soaks, can use the cream prn, avoid constipation or prolonged time on the toilet.  If not improving or worse, then let me know   Addendum: I have communicated with Dr. Delice Lesch about medications specifically medication to help with anxiety and hallucinations and possibly to help with sleep.  We discussed risk and benefits of medication options.  We discussed Seroquel as a good choice compared to some other drugs we discussed including risperidone.  I called back and spoke with wife Diane.  Dr. Delice Lesch had asked about their conversations with Dr. Toy Care, psychiatry.  Apparently he has not seen Dr. Toy Care in over a year before his dementia diagnosis.  Currently Diane does not necessarily want to take him back there as he has trouble just communicating basic ideas and difficulty finding the correct word at this point.  I spoke to Diane about Seroquel, risk and benefits and proper use.  We will start out once daily at bedtime and in 2 weeks if not seeing improvement we will increase to 25 mg twice daily.    Algenis was seen today for med check.  Diagnoses and all orders for this visit:  Dementia without behavioral disturbance, unspecified dementia type ([facility]) -     POCT Urinalysis DIP (Proadvantage Device)  Alzheimer disease  (HCC)  External hemorrhoid  Rectal discomfort  Hallucination  Anxiety  Other orders -     hydrocortisone 2.5 % cream; Apply topically 2 (two) times daily. -     QUEtiapine (SEROQUEL) 25 MG tablet; Take 1 tablet (25 mg total) by mouth 2 (two) times daily.   Follow up: pending call back

## 2021-06-02 NOTE — Telephone Encounter (Signed)
Line busy at 06/02/2021.

## 2021-06-03 ENCOUNTER — Telehealth: Payer: Self-pay | Admitting: Physician Assistant

## 2021-06-03 ENCOUNTER — Telehealth: Payer: Self-pay

## 2021-06-03 NOTE — Telephone Encounter (Signed)
Wife notified PCP sent rx in. Patient is going to follow up as scheduled.

## 2021-06-03 NOTE — Telephone Encounter (Signed)
Spoke to wife and she said he has not been able to start on medication because insurance has not approved it yet. She said he had a bad episode this afternoon that she almost had to call 911 for support, he was going off the deep in. Daughter Caryl Pina had to come over to help calm him down and given some xanax.   Mickel Baas please see if we can get this done ASAP.

## 2021-06-03 NOTE — Telephone Encounter (Signed)
Daughter called and said father had an serious delusional episode today.   She said today was really bad and they were about to call 911 but then she gave him a Xanax she was prescribed for herself (.5 mg) and he calmed right down.  She said this is what her mom has to work with everyday and they'd like some help with getting a prescription for him.  [facility] in [place]

## 2021-06-03 NOTE — Telephone Encounter (Signed)
Called pharmacy because this is unusual for generic Seroquel to require P.A. and insurance is rejecting with no reason, pharmacist tried to override with no success,  they will fax rejection

## 2021-06-03 NOTE — Telephone Encounter (Signed)
She will have to ask his PCP for Xanax, sorry but she will have to ask either PCP or psychiatry.

## 2021-06-04 ENCOUNTER — Other Ambulatory Visit: Payer: Self-pay | Admitting: Medical

## 2021-06-04 MED ORDER — CLONAZEPAM 0.5 MG PO TABS: 0.5000 mg | ORAL_TABLET | Freq: Two times a day (BID) | ORAL | 0 refills | Status: DC

## 2021-06-04 NOTE — Telephone Encounter (Signed)
Urgent P.A. done for Quetiapine (Seroquel) and approved, called pharmacy & went thru for $5, called wife and informed and confirmed there were no issues with getting the Clonazepam

## 2021-06-04 NOTE — Progress Notes (Signed)
rx

## 2021-06-04 NOTE — Telephone Encounter (Signed)
I tried to call several times, voicemail.Left message to contact PCP for Rx. To follow up as scheduled here

## 2021-06-04 NOTE — Telephone Encounter (Signed)
Voicemail will call back. 8:21am

## 2021-06-04 NOTE — Telephone Encounter (Signed)
Pt's wife was notified that I will have Mickel Baas work on this today for patient and that other medication has been sent

## 2021-06-08 NOTE — Telephone Encounter (Signed)
Urgent P.A. done for Quetiapine (Seroquel) and approved, called pharmacy & went thru for $5, called wife and informed and confirmed there were no issues with getting the Clonazepam

## 2021-06-10 ENCOUNTER — Telehealth: Payer: Self-pay | Admitting: Physician Assistant

## 2021-06-10 NOTE — Telephone Encounter (Signed)
Daughter called in and would like to know if her father needs to be seen sooner than November. He has gotten worse by the day, and she needs help dealing with this. She would like a call back [phone]

## 2021-06-11 ENCOUNTER — Telehealth: Payer: Self-pay | Admitting: Medical

## 2021-06-11 MED ORDER — CLONAZEPAM 0.5 MG PO TABS: 0.5000 mg | ORAL_TABLET | Freq: Three times a day (TID) | ORAL | 0 refills | Status: DC | PRN

## 2021-06-11 NOTE — Telephone Encounter (Signed)
He needs refill on Klonopin and I advised Caryl Pina ok to increase klonoipin to 3 times a day and to check back with [name] on Monday

## 2021-06-11 NOTE — Telephone Encounter (Signed)
Pt's daughter called she states he continues to be agitated, they are giving him the klonpin twice a day but having to decide when he needs it the most, wondering if this is something that could be increased to 3 times per day.  Her mother had to call police last night because he was so agitated. Daughter was very emotional, stating it's just very hard to handle him He started seroquil last Thursday and she is aware it will take time to get into his system but states it seems like he has been more agitated since  he started it.  Please call Caryl Pina  [phone]

## 2021-06-15 ENCOUNTER — Telehealth: Payer: Self-pay | Admitting: Medical

## 2021-06-15 ENCOUNTER — Other Ambulatory Visit: Payer: Self-pay | Admitting: Medical

## 2021-06-15 MED ORDER — CLONAZEPAM 0.5 MG PO TABS: 0.5000 mg | ORAL_TABLET | Freq: Every day | ORAL | 0 refills | Status: DC | PRN

## 2021-06-15 NOTE — Telephone Encounter (Signed)
Pt's daughter called and states that new dose of klonopin is working. She states that only a 10 day supply was sent in at new dose. Please send refills to [facility] in [place]. Caryl Pina can be reached at [phone].

## 2021-06-17 ENCOUNTER — Encounter: Payer: Self-pay | Admitting: Physician Assistant

## 2021-06-17 ENCOUNTER — Ambulatory Visit: Payer: Medicare HMO | Admitting: Physician Assistant

## 2021-06-17 ENCOUNTER — Other Ambulatory Visit: Payer: Self-pay

## 2021-06-17 VITALS — BP 132/73 | HR 63 | Ht 70.0 in | Wt 176.0 lb

## 2021-06-17 DIAGNOSIS — G300 Alzheimer's disease with early onset: Secondary | ICD-10-CM

## 2021-06-17 DIAGNOSIS — F0281 Dementia in other diseases classified elsewhere with behavioral disturbance: Secondary | ICD-10-CM

## 2021-06-17 DIAGNOSIS — F02818 Dementia in other diseases classified elsewhere, unspecified severity, with other behavioral disturbance: Secondary | ICD-10-CM

## 2021-06-17 DIAGNOSIS — R413 Other amnesia: Secondary | ICD-10-CM

## 2021-06-17 NOTE — Progress Notes (Signed)
Assessment/Plan:    Early onset Alzheimer's disease, moderate to severe, with behavioral disturbance 65 year old right-handed man, with worsening cognitive disorder, now moderate to severe, with behavioral disturbance, likely due to Alzheimer's disease.  At this point, it is felt unsafe for him to be alone, needing 24/7 assistance.  Family is concerned due to the cost involving these.  However, they are looking into ALF due to a seriousness of his condition.  He is also demonstrating behavioral changes, including hallucinations and paranoia, with periods of agitation.  This will require the management of psychiatric medications, therefore psychiatric referral has been discussed, and the patient's family agrees to proceed.   Recommendations:  Discussed safety both in and out of the home.  Discussed the importance of regular daily schedule  to maintain brain function.  Continue to monitor mood, referral to psychiatry Stay active at least 30 minutes at least 3 times a week.  Naps should be scheduled and should be no longer than 60 minutes and should not occur after 2 PM.  Follow up in 6 months.   Case discussed with Dr. Delice Lesch who agrees with the plan    Subjective:   ED visits since last seen: none  Hospital admissions: none  UPTON RUSSEY is a 65 y.o. male with a history of hyperlipidemia, anxiety, with Neuropsychological evaluation in 2021 indicating early onset Alzheimer's disease seen today in follow up for memory loss. He was last seen at our office on 02/23/2021.  MMSE at the time was 19/30.MRI brain showed mild diffuse atrophy.   He was initiated on donepezil during that time, with close monitoring of bradycardic events and on memantine 10 mg twice daily  which he was unable to tolerate. He is on multiple psychiatric medicines including Lexapro 10 mg daily, Seroquel 25 mg daily, gabapentin 1200 mg qhs, melatonin 3mg  qhs   Previous records as well as any outside records available were  reviewed prior to todays visit.    The patient has difficulty expressing how he feels, with flight of ideas, and at times incoherent sentences, however, his wife is able to support the information, and his daughter is available to provide additionaI input. Since his last visit, his memory is "much worse ".  Wife now administers the medications, manages the finances, helps him with dressing and bathing, and driving.  He is completely dependent on his wife and daughter.  Wife is very tearful, she reports being afraid for him, as he has become verbally abusive, at times "storming, telling me that he is going to kill me ". She reports "he does not remember anything, no clue of what he does, he cannot remember where he put his stuff, and within seconds, he forgets anything we talked ".  "He is really aggressive, and this is not him ".  One time he told her that he was going to shoot her in the head and I had to call the police.  At that time, the wife began to cry and remained tearful throughout the rest of the visit.  She feels that the medications given for mood stabilization "are not helping him, they may be hurting him ".  Her daughter is requesting Ritalin because she read in the Internet that this may be of help. She states that he has hallucinations, such as sees people crawling on the floor, and has to tell him to go away.  He also feels paranoid about people, fearful that they will want to "steal something ".  He  points that people in the newspaper, believing that they are talking to him.  Moreover, the patient deflects, so if he gets angry he tells the wife "you did it "if confronted with an action that he did.  At times, he cannot read.  He sleeps at night with gabapentin, but occasionally he has vivid dreams denies sleepwalking.  Lately, he has been wandering off-daughter reports that he does all he does the whole day, walks that night ". He has shown hoarding behavior, trying to put clothing over another,  or not knowing what to do to put his underwear on.  He now collects coins and places then inside the pants.  Moreover, he has begun drinking beer, "he has an obsession with it ", so the family is trying to give and 0-calorie beer instead.  He reports, despite the difficulty to express himself, that he wishes that it would be a medicine to help him control these behavioral changes, as he knows that he is hurting the family, and then he broke down in tears. His appetite is good, no trouble swallowing.  He ambulates without the help of a cane or an walker.  No falls or head injuries.  Wife denies that the patient has mentioned any headaches, anosmia, double vision, dizziness, focal numbness or tingling unilateral weakness or tremors, urine incontinence retention constipation or diarrhea   Neuropsych Evaluation 04/17/20 Dr. Nicole Kindred "Since the last appointment, he has been about the same.  We discussed the impression of late mild early moderate stage dementia, likely Alzheimer's given his clinical history, test findings, and imaging. 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 I counseled them on diet, suggested they talk with Dr. Delice Lesch about medications, and also discussed Alzheimer's association but they did not wish for a referral. We also discussed that Ativan is typically contraindicated in dementia and he may want to discuss with Dr. Toy Care. "      History on Initial Assessment 02/28/2020: This is a pleasant 65 year old right-handed man with a history of hyperlipidemia, anxiety, presenting for evaluation of memory loss. He feels his memory is not that good. He feels he started noticing changes after he stopped working 4 years ago, he went into early retirement to help take care of his mother with dementia (who has passed away). His wife started noticing changes a little under a year. She initially thought he was just not listening to her, but symptoms have  progressively worsened. He repeats himself and would not remember conversations from 15 minutes prior. Around 4-5 months ago, he could not recall his computer password which he has had for 14 years. She had to write this down for him and he still forgot. He used to build computers in the past, but now she has to help him with the computer or with using the TV remote control. She took over finances 5 months ago, noticing that with online banking, there were 10 entries he put in that were already there. He manages his own medications without difficulties. He drives without getting lost, his wife denies any driving concerns. He has always had word-finding issues but this has worsened. His wife has noticed increased irritability, one night he threw the remote control 4 times because he got frustrated he could not get the TV to work. He gets irritated when she tries to correct him. No paranoia or hallucinations. No REM behavior disorder. He has been on gabapentin and lorazepam 1mg  BID for many  years for anxiety. He denies feeling anxious a lot, his wife states this is why he takes the medication. He did not recall why he is taking the gabapentin. His hands shake when he is nervous. He denies any headaches, dizziness, diplopia, dysarthria/dysphagia, neck/back pain, focal numbness/tingling/weakness, anosmia, no falls. Sleep is good. He feels his mood is "happy when at home." His mother was diagnosed with dementia at age 8. There are other maternal family members with dementia. No history of significant head injuries. He is not drinking alcohol, however his wife notes when their dog died in 08/21/2019, he was drinking for 3 months but his memory got really bad, so he stopped alcohol.   Diagnostic Data: MRI brain with and without contrast done 03/2020 did not show any acute changes. There was mild diffuse atrophy.    Neuropsychological evaluation in July 2021 indicated memory storage problems, naming problems, and  impaired verbal fluency. He also had low scores on processing speed and executive function measures. Diagnosis of mild dementia, likely due to Alzheimer's disease.      Laboratory Data:      Lab Results  Component Value Date    TSH 0.844 04/29/2020    PREVIOUS MEDICATIONS:   CURRENT MEDICATIONS:  Outpatient Encounter Medications as of 06/17/2021  Medication Sig   aspirin EC 81 MG tablet Take 1 tablet (81 mg total) by mouth daily.   clonazePAM (KLONOPIN) 0.5 MG tablet Take 1 tablet (0.5 mg total) by mouth daily as needed for up to 20 days for anxiety.   doxazosin (CARDURA) 2 MG tablet Take 1 tablet (2 mg total) by mouth daily.   escitalopram (LEXAPRO) 10 MG tablet Take 1 tablet (10 mg total) by mouth daily.   gabapentin (NEURONTIN) 800 MG tablet Take 1.5 tablets (1,200 mg total) by mouth at bedtime.   hydrocortisone 2.5 % cream Apply topically 2 (two) times daily.   melatonin 3 MG TABS tablet Take 1 tablet (3 mg total) by mouth at bedtime.   Multiple Vitamin (MULTIVITAMIN) tablet Take 1 tablet by mouth daily.   Omega-3 Fatty Acids (FISH OIL) 1000 MG CAPS Take by mouth.   psyllium (METAMUCIL) 58.6 % packet Take 1 packet by mouth daily.   QUEtiapine (SEROQUEL) 25 MG tablet Take 1 tablet (25 mg total) by mouth 2 (two) times daily.   rosuvastatin (CRESTOR) 20 MG tablet Take 1 tablet (20 mg total) by mouth daily.   No facility-administered encounter medications on file as of 06/17/2021.     Objective:     PHYSICAL EXAMINATION:    VITALS:   Vitals:   06/17/21 1038  BP: 132/73  Pulse: 63  SpO2: 100%  Weight: 176 lb (79.8 kg)  Height: 5\' 10"  (1.778 m)    GEN:  The patient appears stated age and is in NAD. HEENT:  Normocephalic, atraumatic.   Neurological examination:  General: NAD, well-groomed, appears stated age. Orientation: The patient is alert. Oriented to person not to place or date. Unable to name objects Cranial nerves: There is good facial symmetry.The speech is not  fluent, but clear.  No aphasia or dysarthria. Fund of knowledge is reduced ., Recent and remote memory are impaired. Attention and concentration are reduced.   Hearing is intact to conversational tone.    Sensation: Sensation is intact to light touch throughout Motor: Strength is at least antigravity x4. Tremors: none  DTR's 2/4 in UE/LE    No flowsheet data found. MMSE - Mini Mental State Exam 02/23/2021 11/30/2020 04/10/2020  Orientation to time 1 1 3   Orientation to Place 5 5 4   Registration 3 3 3   Attention/ Calculation 2 2 1   Recall 0 0 0  Language- name 2 objects 2 2 2   Language- repeat 1 1 1   Language- follow 3 step command 3 3 2   Language- read & follow direction 1 1 0  Write a sentence 0 0 1  Copy design 1 1 1   Total score 19 19 18     [employer] Mental Exam 02/28/2020  Weekday Correct 1  Current year 1  What state are we in? 1  Amount spent 0  Amount left 0  # of Animals 1  5 objects recall 1  Number series 0  Hour markers 2  Time correct 0  Placed X in triangle correctly 1  Largest Figure 1  Name of male 2  Date back to work 0  Type of work 0  State she lived in 2  Total score 13       Movement examination: Tone: There is normal tone in the UE/LE. No cogwheeling Abnormal movements:  no tremor.  No myoclonus.  No asterixis.   Coordination:  Mild decremation with RAM's. Normal finger to nose. Unable to tell correct number of fingers  Gait and Station: The patient has no difficulty arising out of a deep-seated chair without the use of the hands. The patient's stride length is good.  Gait is cautious and narrow.        Total time spent on today's visit was 60 minutes, including both face-to-face time and nonface-to-face time. Time included that spent on review of records (prior notes available to me/labs/imaging if pertinent), discussing treatment and goals, answering patient's questions and coordinating care.  Cc:  Carlena Hurl, PA-C Sharene Butters, PA-C

## 2021-06-17 NOTE — Patient Instructions (Addendum)
It was a pleasure to see you today at our office.   Recommendations:  Meds: Follow up in 6 months  Referral To Psychiatry   RECOMMENDATIONS FOR ALL PATIENTS WITH MEMORY PROBLEMS: 1. Continue to exercise (Recommend 30 minutes of walking everyday, or 3 hours every week) 2. Increase social interactions - continue going to [place] and enjoy social gatherings with friends and family 3. Eat healthy, avoid fried foods and eat more fruits and vegetables 4. Maintain adequate blood pressure, blood sugar, and blood cholesterol level. Reducing the risk of stroke and cardiovascular disease also helps promoting better memory. 5. Avoid stressful situations. Live a simple life and avoid aggravations. Organize your time and prepare for the next day in anticipation. 6. Sleep well, avoid any interruptions of sleep and avoid any distractions in the bedroom that may interfere with adequate sleep quality 7. Avoid sugar, avoid sweets as there is a strong link between excessive sugar intake, diabetes, and cognitive impairment We discussed the Mediterranean diet, which has been shown to help patients reduce the risk of progressive memory disorders and reduces cardiovascular risk. This includes eating fish, eat fruits and green leafy vegetables, nuts like almonds and hazelnuts, walnuts, and also use olive oil. Avoid fast foods and fried foods as much as possible. Avoid sweets and sugar as sugar use has been linked to worsening of memory function.  There is always a concern of gradual progression of memory problems. If this is the case, then we may need to adjust level of care according to patient needs. Support, both to the patient and caregiver, should then be put into place.    The Alzheimer's Association is here all day, every day for people facing Alzheimer's disease through our free 24/7 Helpline: ([phone] 034-5119. The Helpline provides reliable information and support to all those who need assistance, such as  individuals living with memory loss, Alzheimer's or other dementia, caregivers, health care professionals and the public.  Our highly trained and knowledgeable staff can help you with: Understanding memory loss, dementia and Alzheimer's  Medications and other treatment options  General information about aging and brain health  Skills to provide quality care and to find the best care from professionals  Legal, financial and living-arrangement decisions Our Helpline also features: Confidential care consultation provided by master's level clinicians who can help with decision-making support, crisis assistance and education on issues families face every day  Help in a caller's preferred language using our translation service that features more than 200 languages and dialects  Referrals to local community programs, services and ongoing support     FALL PRECAUTIONS: Be cautious when walking. Scan the area for obstacles that may increase the risk of trips and falls. When getting up in the mornings, sit up at the edge of the bed for a few minutes before getting out of bed. Consider elevating the bed at the head end to avoid drop of blood pressure when getting up. Walk always in a well-lit room (use night lights in the walls). Avoid area rugs or power cords from appliances in the middle of the walkways. Use a walker or a cane if necessary and consider physical therapy for balance exercise. Get your eyesight checked regularly.  FINANCIAL OVERSIGHT: Supervision, especially oversight when making financial decisions or transactions is also recommended.  HOME SAFETY: Consider the safety of the kitchen when operating appliances like stoves, microwave oven, and blender. Consider having supervision and share cooking responsibilities until no longer able to participate in those. Accidents with  firearms and other hazards in the house should be identified and addressed as well.   ABILITY TO BE LEFT ALONE: If  patient is unable to contact 911 operator, consider using LifeLine, or when the need is there, arrange for someone to stay with patients. Smoking is a fire hazard, consider supervision or cessation. Risk of wandering should be assessed by caregiver and if detected at any point, supervision and safe proof recommendations should be instituted.  MEDICATION SUPERVISION: Inability to self-administer medication needs to be constantly addressed. Implement a mechanism to ensure safe administration of the medications.   DRIVING: Regarding driving, in patients with progressive memory problems, driving will be impaired. We advise to have someone else do the driving if trouble finding directions or if minor accidents are reported. Independent driving assessment is available to determine safety of driving.   If you are interested in the driving assessment, you can contact the following:  [employer] in [place]  [place] [phone] or [phone]      [place] refers to food and lifestyle choices that are based on the traditions of countries located on the [employer]. This way of eating has been shown to help prevent certain conditions and improve outcomes for people who have chronic diseases, like kidney disease and heart disease. What are tips for following this plan? Lifestyle  Cook and eat meals together with your family, when possible. Drink enough fluid to keep your urine clear or pale yellow. Be physically active every day. This includes: Aerobic exercise like running or swimming. Leisure activities like gardening, walking, or housework. Get 7-8 hours of sleep each night. If recommended by your health care provider, drink red wine in moderation. This means 1 glass a day for nonpregnant women and 2 glasses a day for men. A glass of wine  equals 5 oz (150 mL). Reading food labels  Check the serving size of packaged foods. For foods such as rice and pasta, the serving size refers to the amount of cooked product, not dry. Check the total fat in packaged foods. Avoid foods that have saturated fat or trans fats. Check the ingredients list for added sugars, such as corn syrup. Shopping  At the grocery store, buy most of your food from the areas near the walls of the store. This includes: Fresh fruits and vegetables (produce). Grains, beans, nuts, and seeds. Some of these may be available in unpackaged forms or large amounts (in bulk). Fresh seafood. Poultry and eggs. Low-fat dairy products. Buy whole ingredients instead of prepackaged foods. Buy fresh fruits and vegetables in-season from local farmers markets. Buy frozen fruits and vegetables in resealable bags. If you do not have access to quality fresh seafood, buy precooked frozen shrimp or canned fish, such as tuna, salmon, or sardines. Buy small amounts of raw or cooked vegetables, salads, or olives from the deli or salad bar at your store. Stock your pantry so you always have certain foods on hand, such as olive oil, canned tuna, canned tomatoes, rice, pasta, and beans. Cooking  Cook foods with extra-virgin olive oil instead of using butter or other vegetable oils. Have meat as a side dish, and have vegetables or grains as your main dish. This means having meat in small portions or adding small amounts of meat to foods like pasta or stew. Use beans or vegetables instead of meat in common dishes like chili or lasagna. Experiment  with different cooking methods. Try roasting or broiling vegetables instead of steaming or sauteing them. Add frozen vegetables to soups, stews, pasta, or rice. Add nuts or seeds for added healthy fat at each meal. You can add these to yogurt, salads, or vegetable dishes. Marinate fish or vegetables using olive oil, lemon juice, garlic, and fresh  herbs. Meal planning  Plan to eat 1 vegetarian meal one day each week. Try to work up to 2 vegetarian meals, if possible. Eat seafood 2 or more times a week. Have healthy snacks readily available, such as: Vegetable sticks with hummus. Greek yogurt. Fruit and nut trail mix. Eat balanced meals throughout the week. This includes: Fruit: 2-3 servings a day Vegetables: 4-5 servings a day Low-fat dairy: 2 servings a day Fish, poultry, or lean meat: 1 serving a day Beans and legumes: 2 or more servings a week Nuts and seeds: 1-2 servings a day Whole grains: 6-8 servings a day Extra-virgin olive oil: 3-4 servings a day Limit red meat and sweets to only a few servings a month What are my food choices? Mediterranean diet Recommended Grains: Whole-grain pasta. Brown rice. Bulgar wheat. Polenta. Couscous. Whole-wheat bread. [place]. Vegetables: Artichokes. Beets. Broccoli. Cabbage. Carrots. Eggplant. Green beans. Chard. Kale. Spinach. Onions. Leeks. Peas. Squash. Tomatoes. Peppers. Radishes. Fruits: Apples. Apricots. Avocado. Berries. Bananas. Cherries. Dates. Figs. Grapes. Lemons. Melon. Oranges. Peaches. Plums. Pomegranate. Meats and other protein foods: Beans. Almonds. Sunflower seeds. Pine nuts. Peanuts. [place]. Salmon. Scallops. Shrimp. [place]. Tilapia. Clams. Oysters. Eggs. Dairy: Low-fat milk. Cheese. Greek yogurt. Beverages: Water. Red wine. Herbal tea. Fats and oils: Extra virgin olive oil. Avocado oil. Grape seed oil. Sweets and desserts: [place] yogurt with honey. Baked apples. Poached pears. Trail mix. Seasoning and other foods: Basil. Cilantro. Coriander. Cumin. Mint. Parsley. Sage. Rosemary. Tarragon. Garlic. Oregano. Thyme. Pepper. Balsalmic vinegar. Tahini. Hummus. Tomato sauce. Olives. Mushrooms. Limit these Grains: Prepackaged pasta or rice dishes. Prepackaged cereal with added sugar. Vegetables: Deep fried potatoes (french fries). Fruits: Fruit canned in syrup. Meats and  other protein foods: Beef. Pork. Lamb. Poultry with skin. Hot dogs. Bernie[place] Salines. Dairy: Ice cream. Sour cream. Whole milk. Beverages: Juice. Sugar-sweetened soft drinks. Beer. Liquor and spirits. Fats and oils: Butter. Canola oil. Vegetable oil. Beef fat (tallow). Lard. Sweets and desserts: Cookies. Cakes. Pies. Candy. Seasoning and other foods: Mayonnaise. Premade sauces and marinades. The items listed may not be a complete list. Talk with your dietitian about what dietary choices are right for you. Summary The Mediterranean diet includes both food and lifestyle choices. Eat a variety of fresh fruits and vegetables, beans, nuts, seeds, and whole grains. Limit the amount of red meat and sweets that you eat. Talk with your health care provider about whether it is safe for you to drink red wine in moderation. This means 1 glass a day for nonpregnant women and 2 glasses a day for men. A glass of wine equals 5 oz (150 mL). This information is not intended to replace advice given to you by your health care provider. Make sure you discuss any questions you have with your health care provider. Document Released: 05/05/2016 Document Revised: 06/07/2016 Document Reviewed: 05/05/2016 Elsevier Interactive Patient Education  2017 [employer].

## 2021-06-22 ENCOUNTER — Telehealth: Payer: Self-pay | Admitting: Medical

## 2021-06-22 NOTE — Telephone Encounter (Signed)
Paul Klein called about Klonopin rx,  Sounds like there is confusion with dose and quantity, rx they are getting is not lasting with him taking 3 times per day He does still have pills at home  On 9/16 Dr. Redmond School had given ok to increase to 3 times a day due to his agitation and per Paul Klein that is better  She also stated that they have set him up to see Geriatric Psychiatrist , Dr. Norma Fredrickson  his first appt is tomorrow and she will let you know how that appointment goes and what they do

## 2021-06-23 DIAGNOSIS — F251 Schizoaffective disorder, depressive type: Secondary | ICD-10-CM | POA: Diagnosis not present

## 2021-06-23 NOTE — Telephone Encounter (Signed)
Wife was notified and psych was ok giving him 3 times a day so she was advised to contact them to get a refill

## 2021-06-24 ENCOUNTER — Other Ambulatory Visit (INDEPENDENT_AMBULATORY_CARE_PROVIDER_SITE_OTHER): Payer: Medicare HMO

## 2021-06-24 ENCOUNTER — Other Ambulatory Visit: Payer: Self-pay

## 2021-06-24 DIAGNOSIS — Z23 Encounter for immunization: Secondary | ICD-10-CM | POA: Diagnosis not present

## 2021-06-24 NOTE — Telephone Encounter (Signed)
Pt's wife came in today upset that she thought you were going to refill this medication and he is out as of today. Her daughter is trying to get in touch with psych to see if they will refill this. Is there anyway you can give them a few pills until psych can refill this.

## 2021-06-24 NOTE — Telephone Encounter (Signed)
Pt was notified and she has to contact them to get the refill that shane can not refill it since he is now under the care of psych

## 2021-06-27 ENCOUNTER — Other Ambulatory Visit: Payer: Self-pay | Admitting: Medical

## 2021-07-07 ENCOUNTER — Encounter (HOSPITAL_COMMUNITY): Payer: Self-pay

## 2021-07-07 ENCOUNTER — Emergency Department (HOSPITAL_COMMUNITY)
Admission: EM | Admit: 2021-07-07 | Discharge: 2021-07-15 | Disposition: A | Discharge status: Home / Self Care | Payer: Medicare HMO | Attending: Emergency Medicine | Admitting: Emergency Medicine

## 2021-07-07 ENCOUNTER — Other Ambulatory Visit: Payer: Self-pay

## 2021-07-07 DIAGNOSIS — Z79899 Other long term (current) drug therapy: Secondary | ICD-10-CM | POA: Insufficient documentation

## 2021-07-07 DIAGNOSIS — G309 Alzheimer's disease, unspecified: Secondary | ICD-10-CM | POA: Insufficient documentation

## 2021-07-07 DIAGNOSIS — R45851 Suicidal ideations: Secondary | ICD-10-CM | POA: Diagnosis not present

## 2021-07-07 DIAGNOSIS — I2510 Atherosclerotic heart disease of native coronary artery without angina pectoris: Secondary | ICD-10-CM | POA: Diagnosis not present

## 2021-07-07 DIAGNOSIS — R45850 Homicidal ideations: Secondary | ICD-10-CM | POA: Diagnosis not present

## 2021-07-07 DIAGNOSIS — Z046 Encounter for general psychiatric examination, requested by authority: Secondary | ICD-10-CM | POA: Insufficient documentation

## 2021-07-07 DIAGNOSIS — Z87891 Personal history of nicotine dependence: Secondary | ICD-10-CM | POA: Insufficient documentation

## 2021-07-07 DIAGNOSIS — F0280 Dementia in other diseases classified elsewhere without behavioral disturbance: Secondary | ICD-10-CM | POA: Diagnosis present

## 2021-07-07 DIAGNOSIS — Z20822 Contact with and (suspected) exposure to covid-19: Secondary | ICD-10-CM | POA: Diagnosis not present

## 2021-07-07 DIAGNOSIS — I10 Essential (primary) hypertension: Secondary | ICD-10-CM | POA: Diagnosis not present

## 2021-07-07 DIAGNOSIS — Y900 Blood alcohol level of less than 20 mg/100 ml: Secondary | ICD-10-CM | POA: Diagnosis not present

## 2021-07-07 DIAGNOSIS — F0283 Dementia in other diseases classified elsewhere, unspecified severity, with mood disturbance: Secondary | ICD-10-CM | POA: Insufficient documentation

## 2021-07-07 DIAGNOSIS — I700 Atherosclerosis of aorta: Secondary | ICD-10-CM | POA: Diagnosis not present

## 2021-07-07 DIAGNOSIS — F03918 Unspecified dementia, unspecified severity, with other behavioral disturbance: Secondary | ICD-10-CM | POA: Diagnosis not present

## 2021-07-07 LAB — COMPREHENSIVE METABOLIC PANEL
ALT: 29 U/L (ref 0–44)
AST: 32 U/L (ref 15–41)
Albumin: 4.3 g/dL (ref 3.5–5.0)
Alkaline Phosphatase: 62 U/L (ref 38–126)
Anion gap: 5 (ref 5–15)
BUN: 9 mg/dL (ref 8–23)
CO2: 30 mmol/L (ref 22–32)
Calcium: 10.2 mg/dL (ref 8.9–10.3)
Chloride: 107 mmol/L (ref 98–111)
Creatinine, Ser: 0.85 mg/dL (ref 0.61–1.24)
GFR, Estimated: >60 mL/min (ref >60)
Glucose, Bld: 112 mg/dL — ABNORMAL HIGH (ref 70–99)
Potassium: 3.8 mmol/L (ref 3.5–5.1)
Sodium: 142 mmol/L (ref 135–145)
Total Bilirubin: 0.4 mg/dL (ref 0.3–1.2)
Total Protein: 7.5 g/dL (ref 6.5–8.1)

## 2021-07-07 LAB — CBC
HCT: 42.4 % (ref 39.0–52.0)
Hemoglobin: 14 g/dL (ref 13.0–17.0)
MCH: 30.9 pg (ref 26.0–34.0)
MCHC: 33 g/dL (ref 30.0–36.0)
MCV: 93.6 fL (ref 80.0–100.0)
Platelets: 256 10*3/uL (ref 150–400)
RBC: 4.53 MIL/uL (ref 4.22–5.81)
RDW: 12.5 % (ref 11.5–15.5)
WBC: 8 10*3/uL (ref 4.0–10.5)
nRBC: 0 % (ref 0.0–0.2)

## 2021-07-07 LAB — ACETAMINOPHEN LEVEL: Acetaminophen (Tylenol), Serum: <10 ug/mL — ABNORMAL LOW (ref 10–30)

## 2021-07-07 LAB — SALICYLATE LEVEL: Salicylate Lvl: <7 mg/dL — ABNORMAL LOW (ref 7.0–30.0)

## 2021-07-07 LAB — ETHANOL: Alcohol, Ethyl (B): <10 mg/dL (ref <10)

## 2021-07-07 MED ORDER — ROSUVASTATIN CALCIUM 20 MG PO TABS
20.0000 mg | ORAL_TABLET | Freq: Every day | ORAL | Status: DC
  Administered 2021-07-08 – 2021-07-15 (×8): 20 mg via ORAL
  Filled 2021-07-07 (×8): qty 1

## 2021-07-07 MED ORDER — ESCITALOPRAM OXALATE 10 MG PO TABS
10.0000 mg | ORAL_TABLET | Freq: Every day | ORAL | Status: DC
  Administered 2021-07-08: 10 mg via ORAL
  Filled 2021-07-07: qty 1

## 2021-07-07 MED ORDER — GABAPENTIN 400 MG PO CAPS
1200.0000 mg | ORAL_CAPSULE | Freq: Every day | ORAL | Status: DC
  Administered 2021-07-07 – 2021-07-14 (×8): 1200 mg via ORAL
  Filled 2021-07-07 (×9): qty 3

## 2021-07-07 MED ORDER — CLONAZEPAM 0.5 MG PO TABS
0.5000 mg | ORAL_TABLET | Freq: Every day | ORAL | Status: DC | PRN
  Administered 2021-07-07: 0.5 mg via ORAL
  Filled 2021-07-07: qty 1

## 2021-07-07 MED ORDER — QUETIAPINE FUMARATE 25 MG PO TABS
25.0000 mg | ORAL_TABLET | Freq: Two times a day (BID) | ORAL | Status: DC
  Administered 2021-07-07 – 2021-07-08 (×2): 25 mg via ORAL
  Filled 2021-07-07 (×2): qty 1

## 2021-07-07 MED ORDER — DOXAZOSIN MESYLATE 2 MG PO TABS
2.0000 mg | ORAL_TABLET | Freq: Every day | ORAL | Status: DC
  Administered 2021-07-08: 2 mg via ORAL
  Filled 2021-07-07: qty 1

## 2021-07-07 MED ORDER — MELATONIN 3 MG PO TABS
3.0000 mg | ORAL_TABLET | Freq: Every day | ORAL | Status: DC
  Administered 2021-07-07: 3 mg via ORAL
  Filled 2021-07-07: qty 1

## 2021-07-07 MED ORDER — ASPIRIN EC 81 MG PO TBEC
81.0000 mg | DELAYED_RELEASE_TABLET | Freq: Every day | ORAL | Status: DC
  Administered 2021-07-08: 81 mg via ORAL
  Filled 2021-07-07: qty 1

## 2021-07-07 MED ORDER — PSYLLIUM 95 % PO PACK
1.0000 | PACK | Freq: Every day | ORAL | Status: DC
  Administered 2021-07-08: 1 via ORAL
  Filled 2021-07-07: qty 1
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
## 2021-07-07 NOTE — ED Notes (Signed)
Pt Belongings placed in triage nursing station cabinet. Pt has  1 belonging bag.

## 2021-07-07 NOTE — ED Notes (Signed)
Patient noted to be wandering multiple times. Patient has been redirectable, but continued redirection has increased patient's agitation. Patient now cursing at staff when redirecting, stating, "This is bull shit." Charge nurse made aware, no sitters currently available. MD made aware, awaiting new orders.

## 2021-07-08 DIAGNOSIS — F03918 Unspecified dementia, unspecified severity, with other behavioral disturbance: Secondary | ICD-10-CM | POA: Diagnosis not present

## 2021-07-08 MED ORDER — ASPIRIN EC 81 MG PO TBEC
81.0000 mg | DELAYED_RELEASE_TABLET | Freq: Every day | ORAL | Status: DC
  Administered 2021-07-09 – 2021-07-14 (×6): 81 mg via ORAL
  Filled 2021-07-08 (×6): qty 1

## 2021-07-08 MED ORDER — HALOPERIDOL 1 MG PO TABS
2.0000 mg | ORAL_TABLET | Freq: Every day | ORAL | Status: DC
  Administered 2021-07-08 – 2021-07-15 (×8): 2 mg via ORAL
  Filled 2021-07-08 (×8): qty 2

## 2021-07-08 MED ORDER — HALOPERIDOL LACTATE 5 MG/ML IJ SOLN
5.0000 mg | Freq: Once | INTRAMUSCULAR | Status: AC
  Administered 2021-07-08: 5 mg via INTRAMUSCULAR
  Filled 2021-07-08: qty 1

## 2021-07-08 MED ORDER — ESCITALOPRAM OXALATE 10 MG PO TABS
10.0000 mg | ORAL_TABLET | Freq: Every day | ORAL | Status: DC
  Administered 2021-07-09 – 2021-07-10 (×2): 10 mg via ORAL
  Filled 2021-07-08 (×2): qty 1

## 2021-07-08 MED ORDER — PSYLLIUM 95 % PO PACK
1.0000 | PACK | Freq: Every day | ORAL | Status: DC | PRN
  Filled 2021-07-08: qty 1

## 2021-07-08 MED ORDER — CLONAZEPAM 0.5 MG PO TABS
0.5000 mg | ORAL_TABLET | Freq: Two times a day (BID) | ORAL | Status: DC
  Administered 2021-07-08 – 2021-07-15 (×15): 0.5 mg via ORAL
  Filled 2021-07-08 (×16): qty 1

## 2021-07-08 MED ORDER — ARIPIPRAZOLE 10 MG PO TABS
10.0000 mg | ORAL_TABLET | Freq: Two times a day (BID) | ORAL | Status: DC
  Administered 2021-07-08 – 2021-07-15 (×15): 10 mg via ORAL
  Filled 2021-07-08 (×15): qty 1

## 2021-07-08 MED ORDER — DOXAZOSIN MESYLATE 2 MG PO TABS
2.0000 mg | ORAL_TABLET | Freq: Every day | ORAL | Status: DC
  Administered 2021-07-09 – 2021-07-14 (×6): 2 mg via ORAL
  Filled 2021-07-08 (×6): qty 1

## 2021-07-08 NOTE — BH Assessment (Signed)
Clinician made contact with pt's team at 0213 in an effort to complete pt's [place] Assessment. Pt's nurse, Vladimir Creeks RN, replied at [place] that pt is currently asleep and unable to participate at this time. Clinician requested pt's team alert TTS when pt is alert/oriented and able to complete his MH Assessment.

## 2021-07-08 NOTE — Progress Notes (Signed)
Upon review of pt's chart , pt meets psych hospitalization. TOC CSW can be re consulted with any TOC SW needs . TOC sign off.    [name].Haralambos Yeatts, MSW, [place]  Transitions of Care Clinical Social Worker I Direct Dial: [phone]  Fax: [phone] [name]

## 2021-07-08 NOTE — ED Provider Notes (Signed)
Emergency Medicine Observation Re-evaluation Note  Paul Klein is a 65 y.o. male, seen on rounds today.  Pt initially presented to the ED for complaints of IVC and Dementia Currently, the patient is awaiting TTS evaluation.  Physical Exam  BP 114/67   Pulse (!) 57   Temp 97.6 F (36.4 C) (Oral)   Resp 16   Ht 6\' 1"  (1.854 m)   Wt 74.8 kg   SpO2 98%   BMI 21.77 kg/m  Physical Exam General: calm, sleeping Cardiac: normal rate Lungs: no increased WOB Psych: calm  ED Course / MDM  EKG:   I have reviewed the labs performed to date as well as medications administered while in observation.  Recent changes in the last 24 hours include none.  Plan  Current plan is for awaiting TTS evaluation. Paul Klein is under involuntary commitment.      Malvin Johns, MD 07/08/21 [phone]
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
## 2021-07-08 NOTE — BH Assessment (Addendum)
@  [id number], Followed up with patient's nurse (I-Li, RN) via secure chat in an effort to complete his initial TTS assessment.
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
## 2021-07-08 NOTE — ED Notes (Signed)
Spoke with pt's daughter Ceylon Arenson, provided brief update. She expressed concern about insurance coverage for inpatient and affordability. Pt's wife plans to visit around noon.

## 2021-07-08 NOTE — BH Assessment (Addendum)
[facility] Assessment Progress Note   Per Sheran Fava, NP, this pt requires psychiatric hospitalization at this time.  Pt presents under IVCThe following facilities have been contacted to seek placement for this pt, with results as noted:  Beds available, information sent, decision pending: [facility]  At capacity: [facility] (unit remains closed)   Jalene Mullet, [place] Coordinator [phone]

## 2021-07-08 NOTE — ED Notes (Signed)
Patient is confused but pleasant. Constantly wandering out of room stating, "I'm so tired.  Where do I go?  Patient needs constant redirection back to room.  Patient compliant.

## 2021-07-09 DIAGNOSIS — F03918 Unspecified dementia, unspecified severity, with other behavioral disturbance: Secondary | ICD-10-CM | POA: Diagnosis present

## 2021-07-09 DIAGNOSIS — G309 Alzheimer's disease, unspecified: Secondary | ICD-10-CM

## 2021-07-09 MED ORDER — HALOPERIDOL LACTATE 5 MG/ML IJ SOLN
5.0000 mg | Freq: Three times a day (TID) | INTRAMUSCULAR | Status: DC | PRN
  Administered 2021-07-09 – 2021-07-13 (×3): 5 mg via INTRAMUSCULAR
  Filled 2021-07-09 (×3): qty 1

## 2021-07-09 MED ORDER — BENZTROPINE MESYLATE 1 MG/ML IJ SOLN
1.0000 mg | Freq: Two times a day (BID) | INTRAMUSCULAR | Status: DC | PRN
  Administered 2021-07-09: 1 mg via INTRAMUSCULAR
  Filled 2021-07-09: qty 2

## 2021-07-09 MED ORDER — BENZTROPINE MESYLATE 1 MG PO TABS: 1.0000 mg | ORAL_TABLET | Freq: Two times a day (BID) | ORAL | Status: DC | PRN

## 2021-07-09 MED ORDER — HALOPERIDOL 5 MG PO TABS
5.0000 mg | ORAL_TABLET | Freq: Three times a day (TID) | ORAL | Status: DC | PRN
  Administered 2021-07-09 – 2021-07-15 (×5): 5 mg via ORAL
  Filled 2021-07-09 (×5): qty 1

## 2021-07-09 MED ORDER — MEMANTINE HCL 5 MG PO TABS
5.0000 mg | ORAL_TABLET | Freq: Every day | ORAL | Status: DC
  Administered 2021-07-09 – 2021-07-15 (×7): 5 mg via ORAL
  Filled 2021-07-09 (×7): qty 1

## 2021-07-09 NOTE — Consult Note (Addendum)
[facility] Face-to-Face Psychiatry Consult   Reason for Consult:  psych consult Referring Physician:  Dorie Rank, MD Patient Identification: Paul Klein MRN:  [id number] Principal Diagnosis: Dementia with behavioral disturbance Diagnosis:  Principal Problem:   Dementia with behavioral disturbance Active Problems:   Alzheimer disease ([place])   Total Time spent with patient: 30 minutes  Subjective:   Paul Klein is a 65 y.o. male patient admitted with dementia with behavorial disturbances.   On assessment patient presented alert, calm and disoriented. Significant issues with word finding noted. Unable to recall time, date, place, or situation; repetitively stating his [birth year] (his birthday) when asked questions. 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 Unable to complete word recall, serial numbers, interpret proverbs. Minimal attention span. Flat affect.   Patient was unable to respond to safety questions. No delusions or hallucinations noted. Per chart review and staff report patient requires set and prompting with ADLs; was noted to attempt to use soap as toothpaste on prior shift. Patient noted to become increasingly agitated and combative towards staff and attempting to leave.    Per EDRN note 07/09/21:  "Patient confused. Patient trying to leave.  Patient resistive to redirection. Patient not going back to room . Pt had to be escorted back to room .  Will not be cooperative with redirection."  HPI:  Paul Klein is a 65 year male with a past history of Alzheimer dementia who presented to [facility] for dementia with behavioral disturbances.   Past Psychiatric History: none  Risk to Self:  yes Risk to Others:  yes Prior Inpatient Therapy:  no Prior Outpatient Therapy:  no  Past Medical History:  Past Medical History:  Diagnosis Date   Alzheimer's disease ([place])    Anxiety    Dr. Toy Care   Diverticulosis of colon     sigmoid, per colonoscopy 2014, Dr. Benson Norway   Dyslipidemia    Hemorrhoids    internal and externa per 2014 colonoscopy   Insomnia    uses Neurontin ( Dr. Toy Care)   Normal cardiac stress test 12/2012   performed due to risk factors, abnormal EKG; Dr. Wynonia Lawman   Panic attack    Second hand smoke exposure    wife    Past Surgical History:  Procedure Laterality Date   ANAL FISSURE REPAIR     COLONOSCOPY  02/19/13   diverticulosis of sigmoid colon, external and internal hemorrhoids, Dr. Benson Norway, repeat in 10 years   SKIN BIOPSY     face, back   WISDOM TOOTH EXTRACTION     Family History:  Family History  Problem Relation Age of Onset   Heart disease Mother        BYPASS HEART SURGERY   Depression Mother    Mental illness Mother    Cancer Mother        skin   Dementia Mother    Hypertension Father    Benign prostatic hyperplasia Father    Cancer Father        skin   Heart disease Father 29       CABG   Depression Sister    Stroke Maternal Grandmother    Diabetes Paternal Grandfather    Family Psychiatric  History: not noted Social History:  Social History   Substance and Sexual Activity  Alcohol Use Yes   Comment: occ     Social History   Substance and Sexual Activity  Drug Use No   Comment: marijuana in the past    Social History   Socioeconomic History   Marital status: Married    Spouse name: Not on file   Number of children: 0   Years of education: Not on file   Highest education level: Not on file  Occupational History   Not on file  Tobacco Use   Smoking status: Former    Packs/day: 2.00    Years: 6.00    Pack years: 12.00    Types: Cigarettes    Quit date: 46    Years since quitting: 52.8   Smokeless tobacco: Never  Vaping Use   Vaping Use: Never used  Substance and Sexual Activity   Alcohol use: Yes    Comment: occ   Drug use: No    Comment: marijuana in the past   Sexual activity: Not on file  Other Topics Concern   Not on file  Social  History Narrative   Walks the dog, does calisthenics.  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 1 grandchild lives in [place], other grandchild teenager, wants to spend time with his friends. 08/2017   Right handed    Social Determinants of Health   Financial Resource Strain: Not on file  Food Insecurity: Not on file  Transportation Needs: Not on file  Physical Activity: Not on file  Stress: Not on file  Social Connections: Not on file   Additional Social History:    Allergies:   Allergies  Allergen Reactions   Prozac [Fluoxetine Hcl]     SSRIs in general cause worsened depression and suicidal ideation    Labs:  Results for orders placed or performed during the hospital encounter of 07/07/21 (from the past 48 hour(s))  Comprehensive metabolic panel     Status: Abnormal   Collection Time: 07/07/21  8:37 PM  Result Value Ref Range   Sodium 142 135 - 145 mmol/L   Potassium 3.8 3.5 - 5.1 mmol/L   Chloride 107 98 - 111 mmol/L   CO2 30 22 - 32 mmol/L   Glucose, Bld 112 (H) 70 - 99 mg/dL    Comment: Glucose reference range applies only to samples taken after fasting for at least 8 hours.   BUN 9 8 - 23 mg/dL   Creatinine, Ser 0.85 0.61 - 1.24 mg/dL   Calcium 10.2 8.9 - 10.3 mg/dL   Total Protein 7.5 6.5 - 8.1 g/dL   Albumin 4.3 3.5 - 5.0 g/dL   AST 32 15 - 41 U/L   ALT 29 0 - 44 U/L   Alkaline Phosphatase 62 38 - 126 U/L   Total Bilirubin 0.4 0.3 - 1.2 mg/dL   GFR, Estimated >60 >60 mL/min    Comment: (NOTE) Calculated using the CKD-EPI Creatinine Equation (2021)    Anion gap 5 5 - 15    Comment: Performed at [facility], [place] [address], [place], [place]  [address]  Ethanol     Status: None   Collection Time: 07/07/21  8:37 PM  Result Value Ref Range   Alcohol, Ethyl (B) <10 <10 mg/dL    Comment: (NOTE) Lowest detectable limit for serum alcohol is 10 mg/dL.  For medical purposes only. Performed at  [facility], [place] [address] [place], [place] [address]   Salicylate level     Status: Abnormal   Collection Time: 07/07/21  8:37 PM  Result Value Ref Range  Salicylate Lvl <5.0 (L) 7.0 - 30.0 mg/dL    Comment: Performed at [facility], [place] [address], [place], [place] [address]  Acetaminophen level     Status: Abnormal   Collection Time: 07/07/21  8:37 PM  Result Value Ref Range   Acetaminophen (Tylenol), Serum <10 (L) 10 - 30 ug/mL    Comment: (NOTE) Therapeutic concentrations vary significantly. A range of 10-30 ug/mL  may be an effective concentration for many patients. However, some  are best treated at concentrations outside of this range. Acetaminophen concentrations >150 ug/mL at 4 hours after ingestion  and >50 ug/mL at 12 hours after ingestion are often associated with  toxic reactions.  Performed at [facility], [place] [address] [place], [place] [address]   cbc     Status: None   Collection Time: 07/07/21  8:37 PM  Result Value Ref Range   WBC 8.0 4.0 - 10.5 K/uL   RBC 4.53 4.22 - 5.81 MIL/uL   Hemoglobin 14.0 13.0 - 17.0 g/dL   HCT 42.4 39.0 - 52.0 %   MCV 93.6 80.0 - 100.0 fL   MCH 30.9 26.0 - 34.0 pg   MCHC 33.0 30.0 - 36.0 g/dL   RDW 12.5 11.5 - 15.5 %   Platelets 256 150 - 400 K/uL   nRBC 0.0 0.0 - 0.2 %    Comment: Performed at [facility], [place] [address] [place],  [address]    Current Facility-Administered Medications  Medication Dose Route Frequency Provider Last Rate Last Admin   ARIPiprazole (ABILIFY) tablet 10 mg  10 mg Oral BID Malvin Johns, MD   10 mg at 07/09/21 1749   aspirin EC tablet 81 mg  81 mg Oral QHS Malvin Johns, MD       clonazePAM (KLONOPIN) tablet 0.5 mg  0.5 mg Oral BID Malvin Johns, MD   0.5 mg at 07/09/21 4496   doxazosin (CARDURA) tablet 2 mg  2 mg Oral QHS Malvin Johns, MD       escitalopram (LEXAPRO) tablet 10 mg  10 mg Oral QHS Malvin Johns, MD       gabapentin (NEURONTIN) capsule 1,200 mg  1,200 mg Oral QHS Dorie Rank, MD   1,200 mg at 07/08/21 2008   haloperidol (HALDOL) tablet 2 mg  2 mg Oral Daily Malvin Johns, MD   2 mg at 07/09/21 0838   psyllium (HYDROCIL/METAMUCIL) 1 packet  1 packet Oral Daily PRN Malvin Johns, MD       rosuvastatin (CRESTOR) tablet 20 mg  20 mg Oral Daily Dorie Rank, MD   20 mg at 07/09/21 7591   Current Outpatient Medications  Medication Sig Dispense Refill   ARIPiprazole (ABILIFY) 5 MG tablet Take 10 mg by mouth 2 (two) times daily.     ASPIRIN LOW DOSE 81 MG EC tablet TAKE 1 TABLET(81 MG) BY MOUTH DAILY (Patient taking differently: Take 81 mg by mouth at bedtime.) 90 tablet 0   clonazePAM (KLONOPIN) 0.5 MG tablet Take 1 tablet (0.5 mg total) by mouth daily as needed for up to 20 days for anxiety. (Patient taking differently: Take 0.5 mg by mouth in the morning, at noon, and at bedtime.) 30 tablet 0   doxazosin (CARDURA) 2 MG tablet Take 1 tablet (2 mg total) by mouth daily. (Patient taking differently: Take 2 mg by mouth at bedtime.) 90 tablet 3   escitalopram (LEXAPRO) 10 MG tablet Take 1 tablet (10 mg total) by mouth daily. (  Patient taking differently: Take 10 mg by mouth at bedtime.) 90 tablet 3   gabapentin (NEURONTIN) 800 MG tablet Take 1.5 tablets (1,200 mg total) by mouth at bedtime. 45 tablet 6   haloperidol (HALDOL) 2 MG tablet Take 2 mg by mouth daily.     Multiple Vitamin (MULTIVITAMIN) tablet Take 1 tablet by mouth at bedtime.     Omega-3 Fatty Acids (FISH OIL) 1000 MG CAPS Take 2,000 mg by mouth at bedtime.     psyllium (METAMUCIL) 58.6 % packet Take 1 packet by mouth daily as needed (constipation).     rosuvastatin (CRESTOR) 20 MG tablet Take 1 tablet (20 mg total) by mouth daily. (Patient taking differently: Take 20 mg by mouth at bedtime.) 90 tablet 3   hydrocortisone 2.5 % cream Apply topically 2 (two) times daily. (Patient not taking: Reported on 07/08/2021) 30 g 0    melatonin 3 MG TABS tablet Take 1 tablet (3 mg total) by mouth at bedtime. (Patient not taking: Reported on 07/08/2021)  0   QUEtiapine (SEROQUEL) 25 MG tablet Take 1 tablet (25 mg total) by mouth 2 (two) times daily. (Patient not taking: Reported on 07/08/2021) 60 tablet 1    Musculoskeletal: Strength & Muscle Tone: within normal limits Gait & Station: normal Patient leans: N/A  Psychiatric Specialty Exam:  Presentation  General Appearance:  Casual Eye Contact: Fleeting Speech: Blocked Speech Volume: Normal Handedness: No data recorded  Mood and Affect  Mood: Euthymic Affect: Flat  Thought Process  Thought Processes: Disorganized Descriptions of Associations:Loose Orientation:None Thought Content:Scattered History of Schizophrenia/Schizoaffective disorder:No  Duration of Psychotic Symptoms:No data recorded Hallucinations:Hallucinations: None Ideas of Reference:None Suicidal Thoughts:Suicidal Thoughts: No Homicidal Thoughts:Homicidal Thoughts: No  Sensorium  Memory: Immediate Poor; Recent Poor; Remote Poor Judgment: Impaired Insight: Poor  Executive Functions  Concentration: Poor Attention Span: Poor Recall: Poor Fund of Knowledge: Fair Language: Poor  Psychomotor Activity  Psychomotor Activity: Psychomotor Activity: Decreased  Assets  Assets: Social Support; Physical Health; Financial Resources/Insurance  Sleep  Sleep: Sleep: Fair  Physical Exam: Physical Exam Vitals and nursing note reviewed.  Constitutional:      General: He is not in acute distress.    Appearance: He is normal weight. He is not ill-appearing or toxic-appearing.  HENT:     Head: Atraumatic.     Nose: Nose normal.     Mouth/Throat:     Mouth: Mucous membranes are moist.     Pharynx: Oropharynx is clear.  Eyes:     Pupils: Pupils are equal, round, and reactive to light.  Cardiovascular:     Rate and Rhythm: Normal rate.  Pulmonary:     Effort: Pulmonary  effort is normal.  Musculoskeletal:        General: Normal range of motion.     Cervical back: Normal range of motion.  Skin:    General: Skin is warm and dry.  Neurological:     Mental Status: He is alert. He is disoriented.  Psychiatric:        Attention and Perception: Attention and perception normal.        Mood and Affect: Mood and affect normal.        Speech: Speech normal.        Cognition and Memory: Cognition and memory normal.   ROS Blood pressure 113/65, pulse (!) 16, temperature (!) 97.4 F (36.3 C), temperature source Oral, resp. rate 16, height 6\' 1"  (1.854 m), weight 74.8 kg, SpO2 97 %. Body mass index is 21.77  kg/m.  Treatment Plan Summary: Daily contact with patient to assess and evaluate symptoms and progress in treatment, Medication management, and Plan continue to seek inpatient gero-psychiatric placement. Medication changes: Haldol PO/IM q8hrs agitation, Cogentin 1mg  BID prn. Namenda 5mg  daily behavioral symptoms associated Alzheimer's disease    Disposition: Recommend psychiatric Inpatient admission when medically cleared. Supportive therapy provided about ongoing stressors. Discussed crisis plan, support from social network, calling 911, coming to the Emergency Department, and calling Suicide Hotline.  Inda Merlin, NP 07/09/2021 12:44 PM

## 2021-07-09 NOTE — ED Provider Notes (Signed)
Emergency Medicine Observation Re-evaluation Note  Paul Klein is a 65 y.o. male, seen on rounds today.  Pt initially presented to the ED for complaints of IVC and Dementia Currently, the patient is resting.  Physical Exam  BP 113/65 (BP Location: Left Arm)   Pulse (!) 16   Temp (!) 97.4 F (36.3 C) (Oral)   Resp 16   Ht 6\' 1"  (1.854 m)   Wt 74.8 kg   SpO2 97%   BMI 21.77 kg/m  Physical Exam General: No acute distress Cardiac: Regular rate Lungs: No respiratory distress Psych: Calm  ED Course / MDM  EKG:   I have reviewed the labs performed to date as well as medications administered while in observation.  Recent changes in the last 24 hours include : None.  Plan  Current plan is for patient to be placed in [facility] psych inpatient. Paul Klein is under involuntary commitment.      Varney Biles, MD 07/09/21 1322

## 2021-07-09 NOTE — ED Notes (Signed)
Patient agitated and verbally aggressive toward staff stating,  "Why won't y'all let me have my beehive in here.  I don't understand."  Patient is oriented to person and place.

## 2021-07-09 NOTE — BH Assessment (Signed)
[facility] Assessment Progress Note   Per Oneida Alar, NP, this pt continues to require psychiatric hospitalization at a facility providing specialty care for geriatric patients at this time.  Pt presents under IVCThe following facilities have been contacted to seek placement for this pt, with results as noted:   Beds available, information sent, decision pending: [employer]  Unable to reach: [employer] [facility] (several calls, no one picks up) [facility] (left message at 12:30) Renel[facility] (left message at 12:29)  Declined: Rosana Hoes (primary dementia is exclusionary)  Not referred: Adela Ports (primary dementia is exclusionary) [place] (dementia is exclusionary) [facility] (dementia is exclusionary) [facility]lyssa [facility] (dementia is exclusionary)  At capacity: [facility] (unit remains closed) [place], [place] Coordinator [phone]

## 2021-07-09 NOTE — ED Notes (Signed)
PRN Haldol 5 mg IM given , effective

## 2021-07-09 NOTE — ED Notes (Signed)
Patient confused attempting to wander around unit.  Patient redirected back to room.

## 2021-07-09 NOTE — ED Notes (Signed)
Patient alert this shift. Patient is confused and disoriented. Patient redirectable. Medication compliant. Patient cooperative at this time.  Pat ambulatory and can complete ADLs with set up and prompting.,

## 2021-07-09 NOTE — ED Notes (Signed)
Patient confused. Patient trying to leave.  Patient resistive to redirection. Patient not going back to room . Pt had to be escorted back to room .  Will not be cooperative with redirection.

## 2021-07-10 DIAGNOSIS — F03918 Unspecified dementia, unspecified severity, with other behavioral disturbance: Secondary | ICD-10-CM | POA: Diagnosis not present

## 2021-07-10 NOTE — Progress Notes (Signed)
Per Suzzanne Cloud, patient meets criteria for inpatient treatment. There are no available or appropriate beds at [facility] today. CSW faxed referrals to the following facilities for review:  [facility] [place] N/A [address], [place] [address] [phone] --  [place] N/A [address] [place], [place] [address] [phone] --  [facility]  Pending - Request Sent N/A [address], [place] [address] [phone] --  [facility]  Pending - No Request Sent N/A [place], [place] [address] [phone] --  [facility]  Pending - No Request Sent N/A [address], [place] [address] [phone] [facility]  Pending - No Request Sent N/A [address], [place] [address] [phone] --  [facility]  Pending - No Request Sent N/A [address] [place] [address] [phone] --  [facility]  Pending - No Request Sent N/A [address] [place] [address] [phone] --  [facility]  Pending - No Request Sent N/A [address] [place] [address] [phone] --  [place]  Pending - No Request Sent N/A [address] [place] [address] [phone] --  [facility]  Pending - No Request Sent N/A [address] [place] [address] [facility]  Pending - No Request Sent N/A [address] [place] [address] [phone] --   TTS will continue to seek bed placement.  Glennie Isle, MSW, [place], LCAS-A Phone: [phone] Disposition/TOC

## 2021-07-10 NOTE — ED Notes (Signed)
Patient continues to walk around room  Patient trying to leave. Pt resistive and uncooperative with redirection.  Pt needed escorted to room. Threatening to staff.

## 2021-07-10 NOTE — ED Notes (Signed)
Patient remains confused but was pleasant and open to redirection this shift.

## 2021-07-10 NOTE — ED Notes (Signed)
Patient continues to be up and restless. No able to rest for any extended period of time.

## 2021-07-10 NOTE — ED Notes (Signed)
PRN Haldol 5 mg IM given.

## 2021-07-10 NOTE — BH Assessment (Signed)
Per Cleon Gustin at [facility], the pt is still under review.    Vertell Novak, [place], [facility], [facility] Triage Specialist [phone]

## 2021-07-10 NOTE — ED Provider Notes (Signed)
Emergency Medicine Observation Re-evaluation Note  Paul Klein is a 65 y.o. male, seen on rounds today.  Pt initially presented to the ED for complaints of IVC and Dementia Currently, the patient is resting comfortably.  Physical Exam  BP (!) 112/58 (BP Location: Left Arm)   Pulse 89   Temp (!) 97.4 F (36.3 C) (Oral)   Resp 19   Ht 6\' 1"  (1.854 m)   Wt 74.8 kg   SpO2 98%   BMI 21.77 kg/m  Physical Exam General: NAD Cardiac: Regular rate Lungs: No respiratory distress Psych: STable  ED Course / MDM  EKG:   I have reviewed the labs performed to date as well as medications administered while in observation.    Plan  Current plan is for inpatient psychiatric hospitalization geri psych  HOSEA HANAWALT is under involuntary commitment.      Wyvonnia Dusky, MD 07/10/21 1452

## 2021-07-11 DIAGNOSIS — F03918 Unspecified dementia, unspecified severity, with other behavioral disturbance: Secondary | ICD-10-CM | POA: Diagnosis not present

## 2021-07-11 DIAGNOSIS — F0280 Dementia in other diseases classified elsewhere without behavioral disturbance: Secondary | ICD-10-CM

## 2021-07-11 DIAGNOSIS — G309 Alzheimer's disease, unspecified: Secondary | ICD-10-CM | POA: Diagnosis not present

## 2021-07-11 MED ORDER — LORAZEPAM 1 MG PO TABS
1.0000 mg | ORAL_TABLET | Freq: Once | ORAL | Status: AC
  Administered 2021-07-11: 1 mg via ORAL
  Filled 2021-07-11: qty 1

## 2021-07-11 MED ORDER — ESCITALOPRAM OXALATE 10 MG PO TABS
5.0000 mg | ORAL_TABLET | Freq: Every day | ORAL | Status: AC
  Administered 2021-07-11 – 2021-07-12 (×2): 5 mg via ORAL
  Filled 2021-07-11 (×2): qty 1

## 2021-07-11 NOTE — ED Notes (Signed)
Due to pt behaviors vitals to be obtained. Respirations even and unlabored

## 2021-07-11 NOTE — ED Notes (Signed)
Daughter at bedside.

## 2021-07-11 NOTE — ED Notes (Signed)
Pt agitated prn meds given

## 2021-07-11 NOTE — ED Provider Notes (Signed)
Emergency Medicine Observation Re-evaluation Note  Paul Klein is a 65 y.o. male, seen on rounds today.  Pt initially presented to the ED for complaints of IVC and Dementia Currently, the patient is awake, calm.  Physical Exam  BP 131/72 (BP Location: Left Arm)   Pulse (!) 57   Temp 98 F (36.7 C) (Oral)   Resp 16   Ht 6\' 1"  (1.854 m)   Wt 74.8 kg   SpO2 100%   BMI 21.77 kg/m  Physical Exam General: No acute distress Cardiac: Regular rhythm Lungs: No respiratory distress Psych: Cooperative, but needs frequent redirection.  ED Course / MDM  EKG:   I have reviewed the labs performed to date as well as medications administered while in observation.  Recent changes in the last 24 hours include no new changes.  Plan  Current plan is for patient to be placed [facility] psych. Paul Klein is under involuntary commitment.      Varney Biles, MD 07/11/21 [phone]

## 2021-07-11 NOTE — Consult Note (Addendum)
Paul Klein is a 65 year old male with a history of Alzheimer's dementia who presented to [facility] via IVC for dementia with behavioral disturbances. On assessment patient remains confused and disoriented; issues wording finding. Continues to require prn medication for agitation.   Per EDRN note 07/11/21 1402: "Pt agitated prn meds given "  Per EDRN note 07/11/21 2410: "Patient awakened confused.  Wandering unit requiring constant redirection."  Per EDRN note 07/10/21 1403: "Patient continues to be up and restless. No able to rest for any extended period of time."  Per EDRN note 07/10/21 1000: "Patient continues to walk around room  Patient trying to leave. Pt resistive and uncooperative with redirection.  Pt needed escorted to room. Threatening to staff."  Plan:   -Discontinue:    -Escitalopram (Lexapro) 10 mg HS: due to possible  activation and insomnia -Taper medication over 2 days to prevent  discontinuation symptoms.  -Begin:   -Escitalopram (Lexapro) taper at 5 mg daily x2 days  to prevent discontinuation symptoms and decrease  Effects of activation and insomnia  -Continue to monitor patient response to medications changes and seek gero-psychiatric placement

## 2021-07-11 NOTE — ED Notes (Signed)
Pt continues to come out of room and is not always redirectable. Security at door at this time. Prn meds have already been given per mar with no results.

## 2021-07-11 NOTE — ED Notes (Addendum)
Wife at bedside.

## 2021-07-11 NOTE — Progress Notes (Signed)
Per Suzzanne Cloud, patient meets criteria for inpatient treatment. There are no available or appropriate beds at [facility] today. CSW re-faxed referrals to the following facilities for review:   [place] [address], [place] [address] [phone] --  [place] [address] [place], [place] [address] [phone] --          [facility]  Pending - No Request Sent N/A [place], [place] [address] [phone] --  [facility]  Pending - No Request Sent N/A [address] [place] [address] [phone] --          [facility]  Pending - No Request Sent N/A [address], [place] [address] [phone] --  [facility]  Pending - No Request Sent N/A [address] [place] [address] [phone] --  [facility]  Pending - No Request Sent N/A [address] [place] [address] [phone] --  [place]  Pending - No Request Sent N/A [address] [place] [address] [phone] --  [facility]  Pending - No Request Sent N/A [address] [place] [address] [phone] --  [facility]  Pending - No Request Sent N/A [address] [place] [address] [phone] --          There are no available or appropriate beds at the following facilities:         [place]            TTS will continue to seek bed placement.   Glennie Isle, MSW, [place], LCAS-A Phone: [phone] Disposition/TOC

## 2021-07-11 NOTE — ED Notes (Signed)
Patient awakened confused.  Wandering unit requiring constant redirection.

## 2021-07-12 DIAGNOSIS — G309 Alzheimer's disease, unspecified: Secondary | ICD-10-CM | POA: Diagnosis not present

## 2021-07-12 DIAGNOSIS — F03918 Unspecified dementia, unspecified severity, with other behavioral disturbance: Secondary | ICD-10-CM

## 2021-07-12 DIAGNOSIS — F0280 Dementia in other diseases classified elsewhere without behavioral disturbance: Secondary | ICD-10-CM | POA: Diagnosis not present

## 2021-07-12 MED ORDER — MIRTAZAPINE 7.5 MG PO TABS
7.5000 mg | ORAL_TABLET | Freq: Every day | ORAL | Status: DC
Start: 2021-07-12 — End: 2021-07-15
  Administered 2021-07-12 – 2021-07-14 (×3): 7.5 mg via ORAL
  Filled 2021-07-12 (×3): qty 1

## 2021-07-12 MED ORDER — LORAZEPAM 1 MG PO TABS
1.0000 mg | ORAL_TABLET | Freq: Four times a day (QID) | ORAL | Status: DC | PRN
Start: 2021-07-12 — End: 2021-07-15
  Administered 2021-07-12: 1 mg via ORAL
  Filled 2021-07-12 (×2): qty 1

## 2021-07-12 MED ORDER — OLANZAPINE 10 MG PO TBDP
10.0000 mg | ORAL_TABLET | Freq: Two times a day (BID) | ORAL | Status: DC | PRN
  Administered 2021-07-12 – 2021-07-14 (×6): 10 mg via ORAL
  Filled 2021-07-12 (×6): qty 1

## 2021-07-12 NOTE — ED Notes (Signed)
Patient is attempting to wander off the unit needing constant redirection by staff.

## 2021-07-12 NOTE — Consult Note (Signed)
[facility] Tele-Psychiatry Consult   Reason for Consult:  psych consult Referring Physician:  Dorie Rank, MD Patient Identification: Paul Klein MRN:  [id number] Principal Diagnosis: Dementia with behavioral disturbance Diagnosis:  Principal Problem:   Dementia with behavioral disturbance Active Problems:   Alzheimer disease ([place])   Total Time spent with patient: 20 minutes  Subjective:   Paul Klein is a 65 y.o. male patient admitted with dementia with behavorial disturbances.   On assessment patient presented alert, calm and disoriented. Significant issues with word finding noted. Unable to recall time, date, place, or situation. Unable to complete word recall, serial numbers, interpret proverbs. Minimal attention span. Flat affect. Reports that he feels depressed and has passive SI with no plan. Today denies hallucinations or paranoia. Nursing staff report that patient is not sleeping well at night. He has not required prn medications today.   Patient often did not respond to questions posed by Probation officer.  No delusions or hallucinations noted. Per chart review and staff report patient requires set and prompting with ADLs; has been combative with staff previously during admission.   Per EDRN note 07/09/21:  "Patient confused. Patient trying to leave.  Patient resistive to redirection. Patient not going back to room . Pt had to be escorted back to room .  Will not be cooperative with redirection."  HPI:  Paul Klein is a 65 year male with a past history of Alzheimer dementia who presented to [facility] for dementia with behavioral disturbances.   Past Psychiatric History: none  Risk to Self:  yes Risk to Others:  yes Prior Inpatient Therapy:  no Prior Outpatient Therapy:  no  Past Medical History:  Past Medical History:  Diagnosis Date   Alzheimer's disease ([place])    Anxiety    Dr. Toy Care   Diverticulosis of colon    sigmoid, per colonoscopy 2014, Dr. Benson Norway   Dyslipidemia    Hemorrhoids     internal and externa per 2014 colonoscopy   Insomnia    uses Neurontin ( Dr. Toy Care)   Normal cardiac stress test 12/2012   performed due to risk factors, abnormal EKG; Dr. Wynonia Lawman   Panic attack    Second hand smoke exposure    wife    Past Surgical History:  Procedure Laterality Date   ANAL FISSURE REPAIR     COLONOSCOPY  02/19/13   diverticulosis of sigmoid colon, external and internal hemorrhoids, Dr. Benson Norway, repeat in 10 years   SKIN BIOPSY     face, back   WISDOM TOOTH EXTRACTION     Family History:  Family History  Problem Relation Age of Onset   Heart disease Mother        BYPASS HEART SURGERY   Depression Mother    Mental illness Mother    Cancer Mother        skin   Dementia Mother    Hypertension Father    Benign prostatic hyperplasia Father    Cancer Father        skin   Heart disease Father 30       CABG   Depression Sister    Stroke Maternal Grandmother    Diabetes Paternal Grandfather    Family Psychiatric  History: not noted Social History:  Social History   Substance and Sexual Activity  Alcohol Use Yes   Comment: occ     Social History   Substance and Sexual Activity  Drug Use No   Comment: marijuana in the past  Social History   Socioeconomic History   Marital status: Married    Spouse name: Not on file   Number of children: 0   Years of education: Not on file   Highest education level: Not on file  Occupational History   Not on file  Tobacco Use   Smoking status: Former    Packs/day: 2.00    Years: 6.00    Pack years: 12.00    Types: Cigarettes    Quit date: 27    Years since quitting: 52.8   Smokeless tobacco: Never  Vaping Use   Vaping Use: Never used  Substance and Sexual Activity   Alcohol use: Yes    Comment: occ   Drug use: No    Comment: marijuana in the past   Sexual activity: Not on file  Other Topics Concern   Not on file  Social History Narrative   Walks the dog, does calisthenics.  Retired 2016.  Was  working in Acupuncturist at [employer], married, 1 child, 2 grandchildren. 1 grandchild lives in [place], other grandchild teenager, wants to spend time with his friends. 08/2017   Right handed    Social Determinants of Health   Financial Resource Strain: Not on file  Food Insecurity: Not on file  Transportation Needs: Not on file  Physical Activity: Not on file  Stress: Not on file  Social Connections: Not on file   Additional Social History:    Allergies:   Allergies  Allergen Reactions   Prozac [Fluoxetine Hcl]     SSRIs in general cause worsened depression and suicidal ideation    Labs:  No results found for this or any previous visit (from the past 22 hour(s)).   Current Facility-Administered Medications  Medication Dose Route Frequency Provider Last Rate Last Admin   ARIPiprazole (ABILIFY) tablet 10 mg  10 mg Oral BID Malvin Johns, MD   10 mg at 07/12/21 0820   aspirin EC tablet 81 mg  81 mg Oral QHS Malvin Johns, MD   81 mg at 07/11/21 2118   benztropine (COGENTIN) tablet 1 mg  1 mg Oral BID PRN Leevy-Johnson, Brooke A, NP       Or   benztropine mesylate (COGENTIN) injection 1 mg  1 mg Intramuscular BID PRN Leevy-Johnson, Brooke A, NP   1 mg at 07/09/21 1433   clonazePAM (KLONOPIN) tablet 0.5 mg  0.5 mg Oral BID Malvin Johns, MD   0.5 mg at 07/12/21 0820   doxazosin (CARDURA) tablet 2 mg  2 mg Oral QHS Malvin Johns, MD   2 mg at 07/11/21 2118   gabapentin (NEURONTIN) capsule 1,200 mg  1,200 mg Oral QHS Dorie Rank, MD   1,200 mg at 07/11/21 2117   haloperidol (HALDOL) tablet 2 mg  2 mg Oral Daily Malvin Johns, MD   2 mg at 07/12/21 0820   haloperidol (HALDOL) tablet 5 mg  5 mg Oral Q8H PRN Leevy-Johnson, Brooke A, NP   5 mg at 07/12/21 0145   Or   haloperidol lactate (HALDOL) injection 5 mg  5 mg Intramuscular Q8H PRN Leevy-Johnson, Brooke A, NP   5 mg at 07/10/21 0958   LORazepam (ATIVAN) tablet 1 mg  1 mg Oral Q6H PRN Mesner, Corene Cornea, MD   1 mg at 07/12/21 0337    memantine (NAMENDA) tablet 5 mg  5 mg Oral Daily Leevy-Johnson, Brooke A, NP   5 mg at 07/12/21 0820   mirtazapine (REMERON) tablet 7.5 mg  7.5 mg Oral  QHS Niel Hummer, NP       OLANZapine zydis (ZYPREXA) disintegrating tablet 10 mg  10 mg Oral BID PRN Deno Etienne, DO       psyllium (HYDROCIL/METAMUCIL) 1 packet  1 packet Oral Daily PRN Malvin Johns, MD       rosuvastatin (CRESTOR) tablet 20 mg  20 mg Oral Daily Dorie Rank, MD   20 mg at 07/11/21 7169   Current Outpatient Medications  Medication Sig Dispense Refill   ARIPiprazole (ABILIFY) 5 MG tablet Take 10 mg by mouth 2 (two) times daily.     ASPIRIN LOW DOSE 81 MG EC tablet TAKE 1 TABLET(81 MG) BY MOUTH DAILY (Patient taking differently: Take 81 mg by mouth at bedtime.) 90 tablet 0   clonazePAM (KLONOPIN) 0.5 MG tablet Take 1 tablet (0.5 mg total) by mouth daily as needed for up to 20 days for anxiety. (Patient taking differently: Take 0.5 mg by mouth in the morning, at noon, and at bedtime.) 30 tablet 0   doxazosin (CARDURA) 2 MG tablet Take 1 tablet (2 mg total) by mouth daily. (Patient taking differently: Take 2 mg by mouth at bedtime.) 90 tablet 3   escitalopram (LEXAPRO) 10 MG tablet Take 1 tablet (10 mg total) by mouth daily. (Patient taking differently: Take 10 mg by mouth at bedtime.) 90 tablet 3   gabapentin (NEURONTIN) 800 MG tablet Take 1.5 tablets (1,200 mg total) by mouth at bedtime. 45 tablet 6   haloperidol (HALDOL) 2 MG tablet Take 2 mg by mouth daily.     Multiple Vitamin (MULTIVITAMIN) tablet Take 1 tablet by mouth at bedtime.     Omega-3 Fatty Acids (FISH OIL) 1000 MG CAPS Take 2,000 mg by mouth at bedtime.     psyllium (METAMUCIL) 58.6 % packet Take 1 packet by mouth daily as needed (constipation).     rosuvastatin (CRESTOR) 20 MG tablet Take 1 tablet (20 mg total) by mouth daily. (Patient taking differently: Take 20 mg by mouth at bedtime.) 90 tablet 3   hydrocortisone 2.5 % cream Apply topically 2 (two) times  daily. (Patient not taking: Reported on 07/08/2021) 30 g 0   melatonin 3 MG TABS tablet Take 1 tablet (3 mg total) by mouth at bedtime. (Patient not taking: Reported on 07/08/2021)  0   QUEtiapine (SEROQUEL) 25 MG tablet Take 1 tablet (25 mg total) by mouth 2 (two) times daily. (Patient not taking: Reported on 07/08/2021) 60 tablet 1    Musculoskeletal: Strength & Muscle Tone: within normal limits Gait & Station: normal Patient leans: N/A  Psychiatric Specialty Exam:  Presentation  General Appearance:  Casual Eye Contact: Minimal Speech: Blocked Speech Volume: Decreased Handedness: No data recorded  Mood and Affect  Mood:Depressed Affect: Flat  Thought Process  Thought Processes: Disorganized Descriptions of Associations:Loose Orientation:None Thought Content:Scattered History of Schizophrenia/Schizoaffective disorder:No  Duration of Psychotic Symptoms: Family reported paranoia prior to admission Hallucinations:No data recorded Ideas of Reference:None Suicidal Thoughts: Passive SI no plan Homicidal Thoughts:Denies  Sensorium  Memory: Immediate Poor; Recent Poor; Remote Poor Judgment: Impaired Insight: Poor  Executive Functions  Concentration: Poor Attention Span: Poor Recall: Poor Fund of Knowledge: Fair Language: Poor  Psychomotor Activity  Psychomotor Activity: No data recorded  Assets  Assets: Social Support; Physical Health; Financial Resources/Insurance  Sleep  Sleep: No data recorded  Physical Exam: Physical Exam Vitals and nursing note reviewed.  Constitutional:      General: He is not in acute distress.    Appearance: He is  not ill-appearing or toxic-appearing.  Pulmonary:     Effort: Pulmonary effort is normal.  Neurological:     Mental Status: He is alert. He is disoriented.  Psychiatric:        Attention and Perception: Attention and perception normal.        Mood and Affect: Affect normal.        Speech: Speech normal.         Cognition and Memory: Cognition and memory normal.   ROS Blood pressure 132/72, pulse 97, temperature (!) 97.5 F (36.4 C), temperature source Oral, resp. rate 20, height 6\' 1"  (1.854 m), weight 74.8 kg, SpO2 96 %. Body mass index is 21.77 kg/m.  Treatment Plan Summary: Daily contact with patient to assess and evaluate symptoms and progress in treatment, Medication management, and Plan continue to seek inpatient gero-psychiatric placement.   Medication changes: Continue Haldol PO/IM q8hrs agitation, Cogentin 1mg  BID prn. Namenda 5mg  daily behavioral symptoms associated Alzheimer's disease    Start remeron 7.5 mg for depression and insomnia  Disposition: Recommend psychiatric Inpatient admission when medically cleared. Supportive therapy provided about ongoing stressors. Discussed crisis plan, support from social network, calling 911, coming to the Emergency Department, and calling Suicide Hotline.  Elmarie Shiley, NP 07/12/2021 3:22 PM

## 2021-07-12 NOTE — ED Notes (Addendum)
Patient continuing to need constant redirection by security, RN, and NT as he attempts to wander the unit.
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
## 2021-07-12 NOTE — ED Notes (Signed)
Since 7:10 AM today, pt has been calm and cooperative. Pt's daughter, son in law, and brother visited the pt. After pt brother left around 3:00 PM, pt has required redirection 4-6 times per hour. Pt is easily redirected.

## 2021-07-13 DIAGNOSIS — G309 Alzheimer's disease, unspecified: Secondary | ICD-10-CM | POA: Diagnosis not present

## 2021-07-13 DIAGNOSIS — F0280 Dementia in other diseases classified elsewhere without behavioral disturbance: Secondary | ICD-10-CM | POA: Diagnosis not present

## 2021-07-13 DIAGNOSIS — F03918 Unspecified dementia, unspecified severity, with other behavioral disturbance: Secondary | ICD-10-CM | POA: Diagnosis not present

## 2021-07-13 MED ORDER — RISPERIDONE 0.5 MG PO TABS
0.5000 mg | ORAL_TABLET | Freq: Once | ORAL | Status: AC
  Administered 2021-07-13: 0.5 mg via ORAL
  Filled 2021-07-13: qty 1

## 2021-07-13 MED ORDER — ZOLPIDEM TARTRATE 5 MG PO TABS
5.0000 mg | ORAL_TABLET | Freq: Every evening | ORAL | Status: DC | PRN
  Administered 2021-07-13 – 2021-07-15 (×2): 5 mg via ORAL
  Filled 2021-07-13 (×2): qty 1

## 2021-07-13 NOTE — Progress Notes (Signed)
Per Behavioral health counselor Jalene Mullet pt was denied for inpatient behavioral health at [facility]. CSW will continue to assist and follow with inpatient behavioral health placement.    Benjaman Kindler, MSW, LCSWA 07/13/2021 12:15 AM

## 2021-07-13 NOTE — Consult Note (Addendum)
[facility] Psych ED Discharge  07/13/2021 12:22 PM Paul Klein  MRN:  [id number]  Method of visit?: Face to Face   Principal Problem: Dementia with behavioral disturbance Discharge Diagnoses: Principal Problem:   Dementia with behavioral disturbance Active Problems:   Alzheimer disease ([place])   Subjective: Paul Klein is a 65 year old male Patient admitted with worsening cognitive impairment, wandering behaviors, aggression, and decrease in mental status.  Patient presents as calm today, oriented to self only, and requires multiple redirection.  Chart review shows no previous psychiatric diagnosis, suicide attempts, and or inpatient admission.  On evaluation he also denies current suicidal ideation, suicidal thoughts, and or suicidal gestures. Although patient is able to participate and answer questions, he has a diagnose of Alzheimer disease and is a poor historian.     On evaluation patient is alert and oriented, calm and cooperative and actively participates in the assessment and answers most questions incorrectly.  Patient does show signs of memory impairment as evident by proper orientation, memory recall, and insight.  Discussed with nursing current findings that are consistent with later stages of dementia.  Also reviewed patient medical evaluation, and appears he has been medically cleared by the emergency room physician, patient has no urine studies on file this admission.    Total Time spent with patient: 30 minutes  Past Psychiatric History: Dementia/ alzheimer's disease  Past Medical History:  Past Medical History:  Diagnosis Date   Alzheimer's disease ([place])    Anxiety    Dr. Toy Care   Diverticulosis of colon    sigmoid, per colonoscopy 2014, Dr. Benson Norway   Dyslipidemia    Hemorrhoids    internal and externa per 2014 colonoscopy   Insomnia    uses Neurontin ( Dr. Toy Care)   Normal cardiac stress test 12/2012   performed due to risk factors, abnormal EKG; Dr. Wynonia Lawman   Panic attack     Second hand smoke exposure    wife    Past Surgical History:  Procedure Laterality Date   ANAL FISSURE REPAIR     COLONOSCOPY  02/19/13   diverticulosis of sigmoid colon, external and internal hemorrhoids, Dr. Benson Norway, repeat in 10 years   SKIN BIOPSY     face, back   WISDOM TOOTH EXTRACTION     Family History:  Family History  Problem Relation Age of Onset   Heart disease Mother        BYPASS HEART SURGERY   Depression Mother    Mental illness Mother    Cancer Mother        skin   Dementia Mother    Hypertension Father    Benign prostatic hyperplasia Father    Cancer Father        skin   Heart disease Father 20       CABG   Depression Sister    Stroke Maternal Grandmother    Diabetes Paternal Grandfather    Family Psychiatric  History: Unable to assess Social History:  Social History   Substance and Sexual Activity  Alcohol Use Yes   Comment: occ     Social History   Substance and Sexual Activity  Drug Use No   Comment: marijuana in the past    Social History   Socioeconomic History   Marital status: Married    Spouse name: Not on file   Number of children: 0   Years of education: Not on file   Highest education level: Not on file  Occupational History  Not on file  Tobacco Use   Smoking status: Former    Packs/day: 2.00    Years: 6.00    Pack years: 12.00    Types: Cigarettes    Quit date: 1970    Years since quitting: 52.8   Smokeless tobacco: Never  Vaping Use   Vaping Use: Never used  Substance and Sexual Activity   Alcohol use: Yes    Comment: occ   Drug use: No    Comment: marijuana in the past   Sexual activity: Not on file  Other Topics Concern   Not on file  Social History Narrative   Walks the dog, does calisthenics.  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 1 grandchild lives in [place], other grandchild teenager, wants to spend time with his friends. 08/2017   Right handed     Social Determinants of Health   Financial Resource Strain: Not on file  Food Insecurity: Not on file  Transportation Needs: Not on file  Physical Activity: Not on file  Stress: Not on file  Social Connections: Not on file    Tobacco Cessation:  N/A, patient does not currently use tobacco products  Current Medications: Current Facility-Administered Medications  Medication Dose Route Frequency Provider Last Rate Last Admin   ARIPiprazole (ABILIFY) tablet 10 mg  10 mg Oral BID Malvin Johns, MD   10 mg at 07/13/21 2956   aspirin EC tablet 81 mg  81 mg Oral QHS Malvin Johns, MD   81 mg at 07/12/21 2030   benztropine (COGENTIN) tablet 1 mg  1 mg Oral BID PRN Leevy-Johnson, Brooke A, NP       Or   benztropine mesylate (COGENTIN) injection 1 mg  1 mg Intramuscular BID PRN Leevy-Johnson, Brooke A, NP   1 mg at 07/09/21 1433   clonazePAM (KLONOPIN) tablet 0.5 mg  0.5 mg Oral BID Malvin Johns, MD   0.5 mg at 07/13/21 2130   doxazosin (CARDURA) tablet 2 mg  2 mg Oral QHS Malvin Johns, MD   2 mg at 07/12/21 2030   gabapentin (NEURONTIN) capsule 1,200 mg  1,200 mg Oral QHS Dorie Rank, MD   1,200 mg at 07/12/21 2030   haloperidol (HALDOL) tablet 2 mg  2 mg Oral Daily Malvin Johns, MD   2 mg at 07/13/21 0927   haloperidol (HALDOL) tablet 5 mg  5 mg Oral Q8H PRN Leevy-Johnson, Brooke A, NP   5 mg at 07/12/21 0145   Or   haloperidol lactate (HALDOL) injection 5 mg  5 mg Intramuscular Q8H PRN Leevy-Johnson, Brooke A, NP   5 mg at 07/10/21 0958   LORazepam (ATIVAN) tablet 1 mg  1 mg Oral Q6H PRN Mesner, Corene Cornea, MD   1 mg at 07/12/21 0337   memantine (NAMENDA) tablet 5 mg  5 mg Oral Daily Leevy-Johnson, Brooke A, NP   5 mg at 07/13/21 0930   mirtazapine (REMERON) tablet 7.5 mg  7.5 mg Oral QHS Elmarie Shiley A, NP   7.5 mg at 07/12/21 2053   OLANZapine zydis (ZYPREXA) disintegrating tablet 10 mg  10 mg Oral BID PRN Deno Etienne, DO   10 mg at 07/13/21 1022   psyllium (HYDROCIL/METAMUCIL) 1 packet   1 packet Oral Daily PRN Malvin Johns, MD       rosuvastatin (CRESTOR) tablet 20 mg  20 mg Oral Daily Dorie Rank, MD   20 mg at 07/13/21 8657   Current Outpatient Medications  Medication Sig Dispense Refill   ARIPiprazole (ABILIFY) 5 MG tablet Take 10 mg by mouth 2 (two) times daily.     ASPIRIN LOW DOSE 81 MG EC tablet TAKE 1 TABLET(81 MG) BY MOUTH DAILY (Patient taking differently: Take 81 mg by mouth at bedtime.) 90 tablet 0   clonazePAM (KLONOPIN) 0.5 MG tablet Take 1 tablet (0.5 mg total) by mouth daily as needed for up to 20 days for anxiety. (Patient taking differently: Take 0.5 mg by mouth in the morning, at noon, and at bedtime.) 30 tablet 0   doxazosin (CARDURA) 2 MG tablet Take 1 tablet (2 mg total) by mouth daily. (Patient taking differently: Take 2 mg by mouth at bedtime.) 90 tablet 3   escitalopram (LEXAPRO) 10 MG tablet Take 1 tablet (10 mg total) by mouth daily. (Patient taking differently: Take 10 mg by mouth at bedtime.) 90 tablet 3   gabapentin (NEURONTIN) 800 MG tablet Take 1.5 tablets (1,200 mg total) by mouth at bedtime. 45 tablet 6   haloperidol (HALDOL) 2 MG tablet Take 2 mg by mouth daily.     Multiple Vitamin (MULTIVITAMIN) tablet Take 1 tablet by mouth at bedtime.     Omega-3 Fatty Acids (FISH OIL) 1000 MG CAPS Take 2,000 mg by mouth at bedtime.     psyllium (METAMUCIL) 58.6 % packet Take 1 packet by mouth daily as needed (constipation).     rosuvastatin (CRESTOR) 20 MG tablet Take 1 tablet (20 mg total) by mouth daily. (Patient taking differently: Take 20 mg by mouth at bedtime.) 90 tablet 3   hydrocortisone 2.5 % cream Apply topically 2 (two) times daily. (Patient not taking: Reported on 07/08/2021) 30 g 0   melatonin 3 MG TABS tablet Take 1 tablet (3 mg total) by mouth at bedtime. (Patient not taking: Reported on 07/08/2021)  0   QUEtiapine (SEROQUEL) 25 MG tablet Take 1 tablet (25 mg total) by mouth 2 (two) times daily. (Patient not taking: Reported on 07/08/2021) 60  tablet 1   PTA Medications: (Not in a hospital admission)   Musculoskeletal: Strength & Muscle Tone: within normal limits Gait & Station: normal Patient leans: N/A  Psychiatric Specialty Exam:  Presentation  General Appearance: Appropriate for Environment; Casual  Eye Contact:Fair  Speech:Clear and Coherent (garbled at times)  Speech Volume:Normal  Handedness:Right   Mood and Affect  Mood:Euthymic  Affect:Appropriate   Thought Process  Thought Processes:Irrevelant  Descriptions of Associations:Loose  Orientation:Partial (self only)  Thought Content:Logical  History of Schizophrenia/Schizoaffective disorder:No  Duration of Psychotic Symptoms:No data recorded Hallucinations:Hallucinations: None  Ideas of Reference:None  Suicidal Thoughts:Suicidal Thoughts: No  Homicidal Thoughts:Homicidal Thoughts: No   Sensorium  Memory:Immediate Poor; Recent Poor; Remote Poor  Judgment:Poor  Insight:Lacking   Executive Functions  Concentration:Poor  Attention Span:Poor  Recall:Poor  Fund of Knowledge:Poor  Language:Poor   Psychomotor Activity  Psychomotor Activity:Psychomotor Activity: Normal   Assets  Assets:Leisure Time; Physical Health; Social Support   Sleep  Sleep:Sleep: Poor   Physical Exam: Physical Exam Vitals and nursing note reviewed.  Constitutional:      Appearance: Normal appearance. He is normal weight.  HENT:     Head: Normocephalic.  Neurological:     General: No focal deficit present.     Mental Status: He is alert. Mental status is at baseline. He is disoriented.  Psychiatric:        Mood and Affect: Mood normal.        Behavior: Behavior normal.   Review of Systems  Psychiatric/Behavioral: Negative.    Blood pressure 130/77, pulse 92, temperature 98.2 F (36.8 C), temperature source Oral, resp. rate 17, height 6\' 1"  (1.854 m), weight 74.8 kg, SpO2 97 %. Body mass index is 21.77 kg/m.   Demographic Factors:   Male, Age 38 or older, and Caucasian  Loss Factors: Decrease in vocational status and Decline in physical health  Historical Factors: Impulsivity  Risk Reduction Factors:   NA  Continued Clinical Symptoms:  Severe Anxiety and/or Agitation Previous Psychiatric Diagnoses and Treatments Medical Diagnoses and Treatments/Surgeries  Cognitive Features That Contribute To Risk:  None    Suicide Risk:  Minimal: No identifiable suicidal ideation.  Patients presenting with no risk factors but with morbid ruminations; may be classified as minimal risk based on the severity of the depressive symptoms    Plan Of Care/Follow-up recommendations:  Other:  Recommend continuing current medications. Patient has not displayed any disruptive behaviors, and appears to be responding to medication adjustments.    Per chart review he continues to have restlessness at times, however is improved with hands on care, ambulation and talking to the patient. Patient may benefit from skilled nursing rehab or assisted living facility.  Will psych clear at this time, it is encouraged that we continue current medications and safety measures.    It is felt that patient may need higher level of care due to worsening memory impairment, confusion, wandering behaviors, and restlessness. Patient behaviors are consistent with a person with  dementia and other memory loss. ALF and Home Health unit would be appropriate as they are well equipped to help elderly persons with memory loss, maintain cognitive skills and help with quality of life. Patient is able to take care of self, however needs more social interaction and person centered care to help with worsening memory impairment and difficult dementia behaviors.   Disposition: Psych cleared. Appears to be responding well to current treatment medications regimen Klonopin 0.5mg  po BID, Abilify 10mg  po BID, Haldol 2mg  po daily, mirtazapine 7.5mg  po qhs.  Consider TOC consult for  referral to higher level of care, possible memory care unit, in the interim he can go back to his previous establishment. His behaviors have improved.   Suella Broad, FNP 07/13/2021, 12:22 PM

## 2021-07-13 NOTE — ED Provider Notes (Signed)
Emergency Medicine Observation Re-evaluation Note  Paul Klein is a 65 y.o. male, seen on rounds today.  Pt initially presented to the ED for complaints of IVC and Dementia Currently, the patient is sleeping.   No events overnight per nursing. Physical Exam  BP 112/63   Pulse (!) 56   Temp 97.9 F (36.6 C) (Oral)   Resp 18   Ht 6\' 1"  (1.854 m)   Wt 74.8 kg   SpO2 93%   BMI 21.77 kg/m  Physical Exam General: No acute distress Cardiac: Regular rhythm Lungs: No respiratory distress Psych: Cooperative, but needs frequent redirection.  ED Course / MDM  EKG:   I have reviewed the labs performed to date as well as medications administered while in observation.  Recent changes in the last 24 hours include no new changes.  Plan  Current plan is for patient to be placed [facility] psych.  Had Zyprexa added yesterday to his as needed's with some less agitation overnight.  VIYAAN CHAMPINE is under involuntary commitment.         Deno Etienne, DO 07/13/21 [phone]

## 2021-07-13 NOTE — Progress Notes (Signed)
Pt pleasant today but required frequent redirection into room. Easily redirected. Pt needs to be observed closely due to the habit of stuffing clothing, bedding, objects into toilet.

## 2021-07-13 NOTE — BH Assessment (Signed)
[place] Assessment Progress Note   Per Sheran Fava, NP, this pt does not require psychiatric hospitalization at this time.  Pt presents under IVC initiated by pt's daughter and upheld by EDP Dorie Rank, MD, which has been rescinded by Hampton Abbot, MD.  Pt is psychiatrically cleared.  Discharge instructions include referrals for local psychiatrists specializing in geriatric care.  A TOC consult has been ordered to address pt's psychosocial needs.  [place], DO and pt's nurse, Dorian Pod, have been notified.  Jalene Mullet, [place] Triage Specialist [phone]

## 2021-07-13 NOTE — Progress Notes (Signed)
CSW attempted to contact pt's spouse [facility] no response left HIPPA compliant VM.   [name].Jonmarc Bodkin, MSW, [place]  Transitions of Care Clinical Social Worker I Direct Dial: [phone]  Fax: [phone] [name]

## 2021-07-13 NOTE — ED Notes (Signed)
Pt wife at bedside.

## 2021-07-13 NOTE — Progress Notes (Signed)
CSW received call from pt's spouse Sidharth Leverette, CSW informed pt's wife pt has been psych cleared and up for d/c. Pt's wife asked about placement. CSW explained the process of long term placement at a memory care facility and nursing facility. CSW informed pt's spouse pt will not be able to wait in the ER for long term care placement. CSW informed pt's spouse a referral for [facility] will be sent out. CSW also informed pt's spouse of private duty agency's that can assist with the pt, it was explained it is an out of pocket expense.   CSW received a call from pt's daughter Caryl Pina, she requested to speak with someone from psych. CSW provided TTS contact information, pt's daughter stated she will be coming to hospital tonight.   [name].Lovena Kluck, MSW, [place]  Transitions of Care Clinical Social Worker I Direct Dial: [phone]  Fax: [phone] [name]

## 2021-07-13 NOTE — ED Notes (Signed)
Pt has been walking around in his room all day. Pt continues to try and put anything such as linen and cloths in toilet. Pt attempted to eat the lid on the water bottle that was on tray, wife was in the room at the time and got it out. Writer put a note on diet orders not to send any bottled water.

## 2021-07-13 NOTE — Discharge Instructions (Signed)
For your behavioral health needs you are advised to follow up with an outpatient psychiatrist.  Contact one of the following providers at your earliest opportunity to schedule an intake appointment:       Gerald Plovsky, MD      [facility]      [address], [address]      [place], [place] [address]      [phone]       Vincent Izediuno, MD      [facility]      [address]      , [place] [address]      [phone]   

## 2021-07-13 NOTE — ED Notes (Signed)
Pt wife in room with pt while eating lunch and stated pt tried to eat the bottle cap off the water bottle. Please watch pt during every meal.

## 2021-07-13 NOTE — ED Notes (Addendum)
CSW informed by nursing, Alfonse Flavors, RN that daughter is not allowing pt to come home due to daughter feeling that the pt is not safe to return home. CSW inquired about ALF placement. Per secure chat;TOC CSW, Lu Duffel, LCSWA advised that a referral for [facility] has been completed and a resources provided for private in home care duty (out of pocket cost), along with education provided that pt can not wait on long term care in the ED. TOC CSW to follow pt due to pt currently being psych cleared per Sheran Fava, NP. This CSW will now end this task on CSW's shift report and TOC CSW will continue to follow pt.     Benjaman Kindler, MSW, LCSWA 07/13/2021 9:06 PM

## 2021-07-14 ENCOUNTER — Telehealth: Payer: Self-pay | Admitting: Physician Assistant

## 2021-07-14 ENCOUNTER — Telehealth: Payer: Self-pay | Admitting: Medical

## 2021-07-14 DIAGNOSIS — F03918 Unspecified dementia, unspecified severity, with other behavioral disturbance: Secondary | ICD-10-CM | POA: Diagnosis not present

## 2021-07-14 MED ORDER — MELATONIN 5 MG PO TABS
5.0000 mg | ORAL_TABLET | Freq: Every day | ORAL | Status: DC
  Administered 2021-07-14: 5 mg via ORAL
  Filled 2021-07-14: qty 1

## 2021-07-14 NOTE — ED Notes (Signed)
Pt has been calm and redirectable during shift. Pt is sleeping at this time, respirations even and unlabored. No signs of aggression or agitation.

## 2021-07-14 NOTE — Telephone Encounter (Signed)
Daughter called an advised. Advised to call hospitalist

## 2021-07-14 NOTE — ED Notes (Signed)
Since 7:05 AM today, pt has been pleasant, calm, and cooperative. Pt is oriented to himself only. Pt wanders around his room and restroom. He attempts to exit his room 5-6 times per hours and is easily redirected. Pt walked on the 1st floor with multiple staff members.

## 2021-07-14 NOTE — Progress Notes (Signed)
CSW spoke with pt's Primary Care physician, he reported he received a call from the family stating their concern about pt being discharge from the hospital. CSW informed MD, pt was psychiatrically clear and ready for d/c. CSW also informed MD about the resources that was provided to family.    2:30pm  CSW spoke with pt's wife , Paul Klein , she stated she does not have transportation right now. She stated her daughter has the car and not sure if she will pick him up. She stated she can pick pt up tomorrow morning and she has contact APS. CSW continue to follow .   [name].[name], MSW, [name]  Transitions of Care Clinical Social Worker I Direct Dial: [phone]  Fax: [phone] [name]
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
## 2021-07-14 NOTE — ED Notes (Signed)
Patient roaming around room and attempting to take equipment apart.  Patient removed screw, however writer unable to determine where it came from.  Patient not open to redirection.
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
## 2021-07-14 NOTE — Telephone Encounter (Signed)
What recommendations would you like me to give wife and daughter if they do not feel safe taking this pt home?
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
## 2021-07-15 ENCOUNTER — Other Ambulatory Visit: Payer: Self-pay

## 2021-07-15 ENCOUNTER — Emergency Department (HOSPITAL_COMMUNITY)
Admission: EM | Admit: 2021-07-15 | Discharge: 2021-07-16 | Disposition: A | Discharge status: Skilled Nursing Facility | Payer: Medicare HMO | Source: Home / Self Care | Attending: Emergency Medicine | Admitting: Emergency Medicine

## 2021-07-15 ENCOUNTER — Ambulatory Visit (INDEPENDENT_AMBULATORY_CARE_PROVIDER_SITE_OTHER)
Admission: EM | Admit: 2021-07-15 | Discharge: 2021-07-15 | Disposition: A | Discharge status: Other Acute Inpatient Hospital | Payer: Medicare HMO | Source: Home / Self Care

## 2021-07-15 ENCOUNTER — Emergency Department (HOSPITAL_COMMUNITY): Payer: Medicare HMO

## 2021-07-15 DIAGNOSIS — R4689 Other symptoms and signs involving appearance and behavior: Secondary | ICD-10-CM | POA: Diagnosis not present

## 2021-07-15 DIAGNOSIS — Y900 Blood alcohol level of less than 20 mg/100 ml: Secondary | ICD-10-CM | POA: Insufficient documentation

## 2021-07-15 DIAGNOSIS — F0282 Dementia in other diseases classified elsewhere, unspecified severity, with psychotic disturbance: Secondary | ICD-10-CM | POA: Insufficient documentation

## 2021-07-15 DIAGNOSIS — Z20822 Contact with and (suspected) exposure to covid-19: Secondary | ICD-10-CM | POA: Insufficient documentation

## 2021-07-15 DIAGNOSIS — Z8659 Personal history of other mental and behavioral disorders: Secondary | ICD-10-CM | POA: Insufficient documentation

## 2021-07-15 DIAGNOSIS — G309 Alzheimer's disease, unspecified: Secondary | ICD-10-CM | POA: Insufficient documentation

## 2021-07-15 DIAGNOSIS — F0283 Dementia in other diseases classified elsewhere, unspecified severity, with mood disturbance: Secondary | ICD-10-CM | POA: Insufficient documentation

## 2021-07-15 DIAGNOSIS — F0280 Dementia in other diseases classified elsewhere without behavioral disturbance: Secondary | ICD-10-CM | POA: Diagnosis not present

## 2021-07-15 DIAGNOSIS — R45850 Homicidal ideations: Secondary | ICD-10-CM | POA: Insufficient documentation

## 2021-07-15 DIAGNOSIS — R45851 Suicidal ideations: Secondary | ICD-10-CM | POA: Insufficient documentation

## 2021-07-15 DIAGNOSIS — I700 Atherosclerosis of aorta: Secondary | ICD-10-CM | POA: Diagnosis not present

## 2021-07-15 DIAGNOSIS — I10 Essential (primary) hypertension: Secondary | ICD-10-CM | POA: Insufficient documentation

## 2021-07-15 DIAGNOSIS — Z87891 Personal history of nicotine dependence: Secondary | ICD-10-CM | POA: Insufficient documentation

## 2021-07-15 DIAGNOSIS — F03918 Unspecified dementia, unspecified severity, with other behavioral disturbance: Secondary | ICD-10-CM

## 2021-07-15 DIAGNOSIS — I2510 Atherosclerotic heart disease of native coronary artery without angina pectoris: Secondary | ICD-10-CM | POA: Insufficient documentation

## 2021-07-15 LAB — URINALYSIS, ROUTINE W REFLEX MICROSCOPIC
Bilirubin Urine: NEGATIVE
Glucose, UA: NEGATIVE mg/dL
Hgb urine dipstick: NEGATIVE
Ketones, ur: 20 mg/dL — AB
Leukocytes,Ua: NEGATIVE
Nitrite: NEGATIVE
Protein, ur: NEGATIVE mg/dL
Specific Gravity, Urine: 1.021 (ref 1.005–1.030)
pH: 5 (ref 5.0–8.0)

## 2021-07-15 LAB — RESP PANEL BY RT-PCR (FLU A&B, COVID) ARPGX2
Influenza A by PCR: NEGATIVE
Influenza B by PCR: NEGATIVE
SARS Coronavirus 2 by RT PCR: NEGATIVE

## 2021-07-15 NOTE — Telephone Encounter (Signed)
Pt experience manager Hilbert Corrigan info given to [place] for patient if needed.

## 2021-07-15 NOTE — ED Provider Notes (Signed)
Emergency Medicine Provider Triage Evaluation Note  Paul Klein , a 65 y.o. male  was evaluated in triage.  Pt complains of medical clearance.  Patient was seen at [facility] earlier today due to worsening concern for family safety.  He has been recommended for inpatient admission.   Patient has no complaints in triage.  No family at bedside to provide additional history.  Review of Systems  Positive: Level 5 caveat secondary to dementia Negative: Level 5 caveat secondary to dementia  Physical Exam  BP 130/71 (BP Location: Right Arm)   Pulse 75   Temp 97.9 F (36.6 C) (Oral)   Resp 18   SpO2 98%  Gen:   Awake, no distress   Resp:  Normal effort  MSK:   Moves extremities without difficulty  Other:  Bizarre speech  Medical Decision Making  Medically screening exam initiated at 11:35 PM.  Appropriate orders placed.  JACEON HEIBERGER was informed that the remainder of the evaluation will be completed by another provider, this initial triage assessment does not replace that evaluation, and the importance of remaining in the ED until their evaluation is complete.  Labs and imaging have been ordered per [facility] psych admission recommendations.  He will require further work-up and evaluation in the emergency department.   Joanne Gavel, PA-C 07/15/21 2346    Margette Fast, MD 07/17/21 [phone]

## 2021-07-15 NOTE — ED Notes (Signed)
PT TRANSFERRED TO [facility] ED FOR FURTHER EVALUATION

## 2021-07-15 NOTE — ED Provider Notes (Signed)
Emergency Medicine Observation Re-evaluation Note  Paul Klein is a 65 y.o. male, seen on rounds today.  Pt initially presented to the ED for complaints of IVC and Dementia Currently, the patient is awaiting discharge with social work helping to coordinate this .  Physical Exam  BP 123/63 (BP Location: Right Arm)   Pulse 63   Temp (!) 97.3 F (36.3 C) (Oral)   Resp 18   Ht 6\' 1"  (1.854 m)   Wt 74.8 kg   SpO2 99%   BMI 21.77 kg/m  Physical Exam General: Standing up and pacing in room but nonviolently Cardiac: No murmur Lungs: Clear Psych: Pacing around room but not hurting himself or others currently  ED Course / MDM  EKG:   I have reviewed the labs performed to date as well as medications administered while in observation.  Recent changes in the last 24 hours include continuous pacing around the room.  Plan  Current plan is for discharge to family after coordination with social work and transition of care team. Paul Klein is under involuntary commitment.      Gracelynn Bircher, Gwenyth Allegra, MD 07/15/21 [phone]
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
## 2021-07-15 NOTE — Discharge Instructions (Signed)
Patient to be transferred to [facility] ED for further safety monitoring while SW seeks placement for inpatient geriatric psychiatric treatment for the patient while patient is in the ED.

## 2021-07-15 NOTE — Progress Notes (Signed)
Post D/C   11:50 am CSW received a call from pt.'s wife- Paul Klein and her daughter Paul Klein, they reported concerns with pt.'s behaviors. Pt's daughter stated pt. has received his medications but he still is verbally abusive towards them. They reported no physical behaviors yet, but pt attempted to pick up the microwave and was redirected. Paul Klein stated pt. starts to yell and cuss when he is told no. CSW suggested continuing to redirect pt, removing any objects that might be a danger, and limiting interaction with pt only if he is attempting to harm himself or someone else. CSW encouraged the family to work with [facility] and follow up with recommendations from the psych.     Paul Klein.Paul Klein, MSW, [place]  Transitions of Care Clinical Social Worker I Direct Dial: [phone]  Fax: [phone] [name]

## 2021-07-15 NOTE — Progress Notes (Signed)
Pt's daughter Caryl Pina is here to pick pt up.   [name].Lasean Rahming, MSW, [place]  Transitions of Care Clinical Social Worker I Direct Dial: [phone]  Fax: [phone] [name]

## 2021-07-15 NOTE — ED Notes (Signed)
Pt has been calm and redirectable during this shift. No signs of distress or agitation. Pt has eaten and taken all medications with no issues.

## 2021-07-15 NOTE — ED Notes (Signed)
According to legal paperwork the wife is POA, there is a written note stating to call daughter.
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
## 2021-07-15 NOTE — Progress Notes (Signed)
CSW spoke with pt's wife Levander Campion, she stated her daughter will be picking pt up around 12pm today.   [name].Janiyah Beery, MSW, [place]  Transitions of Care Clinical Social Worker I Direct Dial: [phone]  Fax: [phone] [name]

## 2021-07-15 NOTE — ED Notes (Signed)
Report given to [place] [email or web address]  [place]
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
## 2021-07-16 ENCOUNTER — Telehealth (HOSPITAL_COMMUNITY): Payer: Self-pay | Admitting: Medical

## 2021-07-16 DIAGNOSIS — I10 Essential (primary) hypertension: Secondary | ICD-10-CM | POA: Diagnosis not present

## 2021-07-16 DIAGNOSIS — F02818 Dementia in other diseases classified elsewhere, unspecified severity, with other behavioral disturbance: Secondary | ICD-10-CM | POA: Diagnosis not present

## 2021-07-16 DIAGNOSIS — G301 Alzheimer's disease with late onset: Secondary | ICD-10-CM | POA: Diagnosis not present

## 2021-07-16 DIAGNOSIS — A512 Primary syphilis of other sites: Secondary | ICD-10-CM | POA: Diagnosis not present

## 2021-07-16 DIAGNOSIS — E119 Type 2 diabetes mellitus without complications: Secondary | ICD-10-CM | POA: Diagnosis not present

## 2021-07-16 DIAGNOSIS — F0153 Vascular dementia, unspecified severity, with mood disturbance: Secondary | ICD-10-CM | POA: Diagnosis not present

## 2021-07-16 DIAGNOSIS — G4709 Other insomnia: Secondary | ICD-10-CM | POA: Diagnosis not present

## 2021-07-16 DIAGNOSIS — Z046 Encounter for general psychiatric examination, requested by authority: Secondary | ICD-10-CM | POA: Diagnosis not present

## 2021-07-16 DIAGNOSIS — R451 Restlessness and agitation: Secondary | ICD-10-CM | POA: Diagnosis not present

## 2021-07-16 DIAGNOSIS — N400 Enlarged prostate without lower urinary tract symptoms: Secondary | ICD-10-CM | POA: Diagnosis not present

## 2021-07-16 DIAGNOSIS — K625 Hemorrhage of anus and rectum: Secondary | ICD-10-CM | POA: Diagnosis not present

## 2021-07-16 DIAGNOSIS — F0283 Dementia in other diseases classified elsewhere, unspecified severity, with mood disturbance: Secondary | ICD-10-CM | POA: Diagnosis not present

## 2021-07-16 DIAGNOSIS — F29 Unspecified psychosis not due to a substance or known physiological condition: Secondary | ICD-10-CM | POA: Diagnosis not present

## 2021-07-16 DIAGNOSIS — N390 Urinary tract infection, site not specified: Secondary | ICD-10-CM | POA: Diagnosis not present

## 2021-07-16 DIAGNOSIS — Z87891 Personal history of nicotine dependence: Secondary | ICD-10-CM | POA: Diagnosis not present

## 2021-07-16 DIAGNOSIS — R630 Anorexia: Secondary | ICD-10-CM | POA: Diagnosis not present

## 2021-07-16 DIAGNOSIS — E785 Hyperlipidemia, unspecified: Secondary | ICD-10-CM | POA: Diagnosis not present

## 2021-07-16 DIAGNOSIS — F0390 Unspecified dementia without behavioral disturbance: Secondary | ICD-10-CM | POA: Diagnosis not present

## 2021-07-16 DIAGNOSIS — Z5181 Encounter for therapeutic drug level monitoring: Secondary | ICD-10-CM | POA: Diagnosis not present

## 2021-07-16 DIAGNOSIS — Z113 Encounter for screening for infections with a predominantly sexual mode of transmission: Secondary | ICD-10-CM | POA: Diagnosis not present

## 2021-07-16 DIAGNOSIS — Z139 Encounter for screening, unspecified: Secondary | ICD-10-CM | POA: Diagnosis not present

## 2021-07-16 DIAGNOSIS — R4182 Altered mental status, unspecified: Secondary | ICD-10-CM | POA: Diagnosis not present

## 2021-07-16 DIAGNOSIS — R197 Diarrhea, unspecified: Secondary | ICD-10-CM | POA: Diagnosis not present

## 2021-07-16 DIAGNOSIS — R6889 Other general symptoms and signs: Secondary | ICD-10-CM | POA: Diagnosis not present

## 2021-07-16 DIAGNOSIS — G309 Alzheimer's disease, unspecified: Secondary | ICD-10-CM | POA: Diagnosis not present

## 2021-07-16 DIAGNOSIS — I2510 Atherosclerotic heart disease of native coronary artery without angina pectoris: Secondary | ICD-10-CM | POA: Diagnosis not present

## 2021-07-16 DIAGNOSIS — R509 Fever, unspecified: Secondary | ICD-10-CM | POA: Diagnosis not present

## 2021-07-16 DIAGNOSIS — Z712 Person consulting for explanation of examination or test findings: Secondary | ICD-10-CM | POA: Diagnosis not present

## 2021-07-16 DIAGNOSIS — R7303 Prediabetes: Secondary | ICD-10-CM | POA: Diagnosis not present

## 2021-07-16 DIAGNOSIS — Z79899 Other long term (current) drug therapy: Secondary | ICD-10-CM | POA: Diagnosis not present

## 2021-07-16 DIAGNOSIS — Z119 Encounter for screening for infectious and parasitic diseases, unspecified: Secondary | ICD-10-CM | POA: Diagnosis not present

## 2021-07-16 DIAGNOSIS — R45850 Homicidal ideations: Secondary | ICD-10-CM | POA: Diagnosis not present

## 2021-07-16 DIAGNOSIS — F03918 Unspecified dementia, unspecified severity, with other behavioral disturbance: Secondary | ICD-10-CM | POA: Diagnosis not present

## 2021-07-16 DIAGNOSIS — Z20822 Contact with and (suspected) exposure to covid-19: Secondary | ICD-10-CM | POA: Diagnosis not present

## 2021-07-16 LAB — COMPREHENSIVE METABOLIC PANEL
ALT: 33 U/L (ref 0–44)
AST: 41 U/L (ref 15–41)
Albumin: 4.2 g/dL (ref 3.5–5.0)
Alkaline Phosphatase: 62 U/L (ref 38–126)
Anion gap: 8 (ref 5–15)
BUN: 11 mg/dL (ref 8–23)
CO2: 27 mmol/L (ref 22–32)
Calcium: 9.3 mg/dL (ref 8.9–10.3)
Chloride: 104 mmol/L (ref 98–111)
Creatinine, Ser: 0.97 mg/dL (ref 0.61–1.24)
GFR, Estimated: >60 mL/min (ref >60)
Glucose, Bld: 107 mg/dL — ABNORMAL HIGH (ref 70–99)
Potassium: 3.8 mmol/L (ref 3.5–5.1)
Sodium: 139 mmol/L (ref 135–145)
Total Bilirubin: 0.7 mg/dL (ref 0.3–1.2)
Total Protein: 6.7 g/dL (ref 6.5–8.1)

## 2021-07-16 LAB — RAPID URINE DRUG SCREEN, HOSP PERFORMED
Amphetamines: NOT DETECTED
Barbiturates: NOT DETECTED
Benzodiazepines: NOT DETECTED
Cocaine: NOT DETECTED
Opiates: NOT DETECTED
Tetrahydrocannabinol: NOT DETECTED

## 2021-07-16 LAB — HEMOGLOBIN A1C
Hgb A1c MFr Bld: 5.9 % — ABNORMAL HIGH (ref 4.8–5.6)
Mean Plasma Glucose: 122.63 mg/dL

## 2021-07-16 LAB — URINALYSIS, ROUTINE W REFLEX MICROSCOPIC
Bilirubin Urine: NEGATIVE
Glucose, UA: NEGATIVE mg/dL
Hgb urine dipstick: NEGATIVE
Ketones, ur: 5 mg/dL — AB
Leukocytes,Ua: NEGATIVE
Nitrite: NEGATIVE
Protein, ur: NEGATIVE mg/dL
Specific Gravity, Urine: 1.011 (ref 1.005–1.030)
pH: 5 (ref 5.0–8.0)

## 2021-07-16 LAB — CBC WITH DIFFERENTIAL/PLATELET
Abs Immature Granulocytes: 0.02 10*3/uL (ref 0.00–0.07)
Basophils Absolute: 0 10*3/uL (ref 0.0–0.1)
Basophils Relative: 0 %
Eosinophils Absolute: 0.1 10*3/uL (ref 0.0–0.5)
Eosinophils Relative: 1 %
HCT: 42 % (ref 39.0–52.0)
Hemoglobin: 14 g/dL (ref 13.0–17.0)
Immature Granulocytes: 0 %
Lymphocytes Relative: 18 %
Lymphs Abs: 1.8 10*3/uL (ref 0.7–4.0)
MCH: 30.8 pg (ref 26.0–34.0)
MCHC: 33.3 g/dL (ref 30.0–36.0)
MCV: 92.3 fL (ref 80.0–100.0)
Monocytes Absolute: 0.9 10*3/uL (ref 0.1–1.0)
Monocytes Relative: 9 %
Neutro Abs: 7.3 10*3/uL (ref 1.7–7.7)
Neutrophils Relative %: 72 %
Platelets: 265 10*3/uL (ref 150–400)
RBC: 4.55 MIL/uL (ref 4.22–5.81)
RDW: 12 % (ref 11.5–15.5)
WBC: 10.1 10*3/uL (ref 4.0–10.5)
nRBC: 0 % (ref 0.0–0.2)

## 2021-07-16 LAB — RESP PANEL BY RT-PCR (FLU A&B, COVID) ARPGX2
Influenza A by PCR: NEGATIVE
Influenza B by PCR: NEGATIVE
SARS Coronavirus 2 by RT PCR: NEGATIVE

## 2021-07-16 LAB — ACETAMINOPHEN LEVEL: Acetaminophen (Tylenol), Serum: <10 ug/mL — ABNORMAL LOW (ref 10–30)

## 2021-07-16 LAB — ETHANOL: Alcohol, Ethyl (B): <10 mg/dL (ref <10)

## 2021-07-16 LAB — SALICYLATE LEVEL: Salicylate Lvl: <7 mg/dL — ABNORMAL LOW (ref 7.0–30.0)

## 2021-07-16 LAB — LIPID PANEL
Cholesterol: 124 mg/dL (ref 0–200)
HDL: 73 mg/dL (ref >40)
LDL Cholesterol: 42 mg/dL (ref 0–99)
Total CHOL/HDL Ratio: 1.7 RATIO
Triglycerides: 45 mg/dL (ref <150)
VLDL: 9 mg/dL (ref 0–40)

## 2021-07-16 LAB — TSH: TSH: 1.31 u[IU]/mL (ref 0.350–4.500)

## 2021-07-16 MED ORDER — ACETAMINOPHEN 325 MG PO TABS: 650.0000 mg | ORAL_TABLET | ORAL | Status: DC | PRN

## 2021-07-16 MED ORDER — MIRTAZAPINE 7.5 MG PO TABS
7.5000 mg | ORAL_TABLET | Freq: Every day | ORAL | Status: DC
  Administered 2021-07-16: 7.5 mg via ORAL
  Filled 2021-07-16 (×2): qty 1

## 2021-07-16 MED ORDER — ASPIRIN EC 81 MG PO TBEC: 81.0000 mg | DELAYED_RELEASE_TABLET | Freq: Every day | ORAL | Status: DC

## 2021-07-16 MED ORDER — QUETIAPINE FUMARATE 25 MG PO TABS
25.0000 mg | ORAL_TABLET | Freq: Two times a day (BID) | ORAL | Status: DC
  Administered 2021-07-16: 25 mg via ORAL
  Filled 2021-07-16 (×2): qty 1

## 2021-07-16 MED ORDER — BENZTROPINE MESYLATE 1 MG/ML IJ SOLN
1.0000 mg | Freq: Two times a day (BID) | INTRAMUSCULAR | Status: DC | PRN
  Filled 2021-07-16: qty 1

## 2021-07-16 MED ORDER — ARIPIPRAZOLE 10 MG PO TABS
10.0000 mg | ORAL_TABLET | Freq: Two times a day (BID) | ORAL | Status: DC
  Administered 2021-07-16: 10 mg via ORAL
  Filled 2021-07-16: qty 1

## 2021-07-16 MED ORDER — LORAZEPAM 1 MG PO TABS
1.0000 mg | ORAL_TABLET | Freq: Once | ORAL | Status: AC
  Administered 2021-07-16: 1 mg via ORAL
  Filled 2021-07-16: qty 1

## 2021-07-16 MED ORDER — HALOPERIDOL 5 MG PO TABS
5.0000 mg | ORAL_TABLET | Freq: Three times a day (TID) | ORAL | Status: DC | PRN
  Filled 2021-07-16: qty 1

## 2021-07-16 MED ORDER — CLONAZEPAM 0.5 MG PO TABS
0.5000 mg | ORAL_TABLET | Freq: Two times a day (BID) | ORAL | Status: DC
  Administered 2021-07-16: 0.5 mg via ORAL
  Filled 2021-07-16: qty 1

## 2021-07-16 MED ORDER — BENZTROPINE MESYLATE 1 MG PO TABS
1.0000 mg | ORAL_TABLET | Freq: Two times a day (BID) | ORAL | Status: DC | PRN
  Administered 2021-07-16: 1 mg via ORAL
  Filled 2021-07-16: qty 1

## 2021-07-16 MED ORDER — ESCITALOPRAM OXALATE 10 MG PO TABS: 10.0000 mg | ORAL_TABLET | Freq: Every day | ORAL | Status: DC

## 2021-07-16 MED ORDER — CLONAZEPAM 0.5 MG PO TABS: 0.5000 mg | ORAL_TABLET | Freq: Every day | ORAL | Status: DC | PRN

## 2021-07-16 MED ORDER — ROSUVASTATIN CALCIUM 20 MG PO TABS: 20.0000 mg | ORAL_TABLET | Freq: Every day | ORAL | Status: DC

## 2021-07-16 MED ORDER — GABAPENTIN 300 MG PO CAPS: 1200.0000 mg | ORAL_CAPSULE | Freq: Every day | ORAL | Status: DC

## 2021-07-16 MED ORDER — DOXAZOSIN MESYLATE 2 MG PO TABS
2.0000 mg | ORAL_TABLET | Freq: Every day | ORAL | Status: DC
  Filled 2021-07-16: qty 1

## 2021-07-16 MED ORDER — ALUM & MAG HYDROXIDE-SIMETH 200-200-20 MG/5ML PO SUSP: 30.0000 mL | Freq: Four times a day (QID) | ORAL | Status: DC | PRN

## 2021-07-16 MED ORDER — HALOPERIDOL 1 MG PO TABS: 2.0000 mg | ORAL_TABLET | Freq: Every day | ORAL | Status: DC

## 2021-07-16 MED ORDER — MEMANTINE HCL 5 MG PO TABS
5.0000 mg | ORAL_TABLET | Freq: Every day | ORAL | Status: DC
  Administered 2021-07-16: 5 mg via ORAL
  Filled 2021-07-16: qty 1

## 2021-07-16 MED ORDER — HALOPERIDOL LACTATE 5 MG/ML IJ SOLN
5.0000 mg | Freq: Three times a day (TID) | INTRAMUSCULAR | Status: DC | PRN
  Administered 2021-07-16: 5 mg via INTRAMUSCULAR
  Filled 2021-07-16: qty 1

## 2021-07-16 MED ORDER — ARIPIPRAZOLE 10 MG PO TABS: 10.0000 mg | ORAL_TABLET | Freq: Two times a day (BID) | ORAL | Status: DC

## 2021-07-16 NOTE — ED Provider Notes (Signed)
Emergency Department Provider Note   I have reviewed the triage vital signs and the nursing notes.   HISTORY  Chief Complaint Psychiatric Evaluation   HPI Paul Klein is a 65 y.o. male with past medical history reviewed below including dementia presents to the emergency department from behavioral health urgent care.  Patient initially presented there with family.  Family describes intermittent outbursts with confusion and threats made toward the patient's wife.  Per chart review the patient made specific threats regarding harming his wife today which prompted the [facility] evaluation.  After that evaluation, it was determined the patient would meet inpatient Geri psych criteria and patient was transferred to [facility] emergency department for medical clearance and placement.  The patient is unable to provide any significant history.  He is confused although pleasant here with me. Denies any pain. Does not recall the events of this evening.   Level 5 caveat: Dementia    Past Medical History:  Diagnosis Date   Alzheimer's disease ([place])    Anxiety    Dr. Toy Care   Diverticulosis of colon    sigmoid, per colonoscopy 2014, Dr. Benson Norway   Dyslipidemia    Hemorrhoids    internal and externa per 2014 colonoscopy   Insomnia    uses Neurontin ( Dr. Toy Care)   Normal cardiac stress test 12/2012   performed due to risk factors, abnormal EKG; Dr. Wynonia Lawman   Panic attack    Second hand smoke exposure    wife    Patient Active Problem List   Diagnosis Date Noted   Dementia with behavioral disturbance 07/09/2021   External hemorrhoid 06/02/2021   Hallucination 06/02/2021   Anxiety 06/02/2021   Rectal discomfort 06/02/2021   Advance directive discussed with patient 01/25/2021   Need for pneumococcal vaccination 01/25/2021   Hyperlipidemia 11/30/2020   Encounter for health maintenance examination in adult 11/30/2020   Medicare annual wellness visit, subsequent 11/30/2020   Alzheimer disease ([place])  11/30/2020   Abnormal albumin 07/07/2020   Impaired fasting blood sugar 07/07/2020   Atherosclerosis of coronary artery of native heart without angina pectoris 07/07/2020   Aortic atherosclerosis ([place]) 07/07/2020   Essential hypertension, benign 04/29/2020   Urinary frequency 04/29/2020   Overactive bladder 04/29/2020   Benign prostatic hyperplasia with urinary frequency 04/29/2020   Screen for colon cancer 04/29/2020   Dementia without behavioral disturbance ([place]) 04/15/2020   Abdominal discomfort 04/15/2020   Frequent urination 04/15/2020   Constipation 10/15/2019   Bradycardia 07/18/2018   Onychomycosis 08/30/2016   Insomnia 08/03/2015   Generalized anxiety disorder 08/03/2015   Screening for prostate cancer 08/03/2015   Need for influenza vaccination 08/03/2015   Vaccine counseling 08/03/2015   Second hand tobacco smoke exposure 07/31/2014   Dyslipidemia 07/31/2014    Past Surgical History:  Procedure Laterality Date   ANAL FISSURE REPAIR     COLONOSCOPY  02/19/13   diverticulosis of sigmoid colon, external and internal hemorrhoids, Dr. Benson Norway, repeat in 10 years   SKIN BIOPSY     face, back   WISDOM TOOTH EXTRACTION      Allergies Prozac [fluoxetine hcl]  Family History  Problem Relation Age of Onset   Heart disease Mother        BYPASS HEART SURGERY   Depression Mother    Mental illness Mother    Cancer Mother        skin   Dementia Mother    Hypertension Father    Benign prostatic hyperplasia Father  Cancer Father        skin   Heart disease Father 59       CABG   Depression Sister    Stroke Maternal Grandmother    Diabetes Paternal Grandfather     Social History Social History   Tobacco Use   Smoking status: Former    Packs/day: 2.00    Years: 6.00    Pack years: 12.00    Types: Cigarettes    Quit date: 1970    Years since quitting: 52.8   Smokeless tobacco: Never  Vaping Use   Vaping Use: Never used  Substance Use Topics   Alcohol use:  Yes    Comment: occ   Drug use: No    Comment: marijuana in the past    Review of Systems  Level 5 caveat: Dementia  ____________________________________________   PHYSICAL EXAM:  VITAL SIGNS: ED Triage Vitals  Enc Vitals Group     BP 07/15/21 2329 130/71     Pulse Rate 07/15/21 2329 75     Resp 07/15/21 2329 18     Temp 07/15/21 2329 97.9 F (36.6 C)     Temp Source 07/15/21 2329 Oral     SpO2 07/15/21 2329 98 %    Constitutional: Alert but confused. No distress. Cooperative.  Eyes: Conjunctivae are normal.  Head: Atraumatic. Nose: No congestion/rhinnorhea. Mouth/Throat: Mucous membranes are moist.   Neck: No stridor.  Cardiovascular: Normal rate, regular rhythm. Good peripheral circulation. Grossly normal heart sounds.   Respiratory: Normal respiratory effort.  No retractions. Lungs CTAB. Gastrointestinal: Soft and nontender. No distention.  Musculoskeletal: No lower extremity tenderness nor edema. No gross deformities of extremities. Neurologic:  Normal speech and language. No gross focal neurologic deficits are appreciated.  Skin:  Skin is warm, dry and intact. No rash noted.  ____________________________________________   LABS (all labs ordered are listed, but only abnormal results are displayed)  Labs Reviewed  COMPREHENSIVE METABOLIC PANEL - Abnormal; Notable for the following components:      Result Value   Glucose, Bld 107 (*)    All other components within normal limits  ACETAMINOPHEN LEVEL - Abnormal; Notable for the following components:   Acetaminophen (Tylenol), Serum <10 (*)    All other components within normal limits  SALICYLATE LEVEL - Abnormal; Notable for the following components:   Salicylate Lvl <3.7 (*)    All other components within normal limits  HEMOGLOBIN A1C - Abnormal; Notable for the following components:   Hgb A1c MFr Bld 5.9 (*)    All other components within normal limits  RESP PANEL BY RT-PCR (FLU A&B, COVID) ARPGX2  ETHANOL   CBC WITH DIFFERENTIAL/PLATELET  LIPID PANEL  TSH  RAPID URINE DRUG SCREEN, HOSP PERFORMED  URINALYSIS, ROUTINE W REFLEX MICROSCOPIC   ____________________________________________  RADIOLOGY  DG Chest 2 View  Result Date: 07/16/2021 CLINICAL DATA:  History of dementia, psych clearance EXAM: CHEST - 2 VIEW COMPARISON:  05/26/2020 FINDINGS: Cardiac shadow is within normal limits. Aortic calcifications are seen. The lungs are clear bilaterally. No acute bony abnormality is noted. IMPRESSION: No acute abnormality seen. Electronically Signed   By: Inez Catalina M.D.   On: 07/16/2021 00:06    ____________________________________________   PROCEDURES  Procedure(s) performed:   Procedures  None  ____________________________________________   INITIAL IMPRESSION / ASSESSMENT AND PLAN / ED COURSE  Pertinent labs & imaging results that were available during my care of the patient were reviewed by me and considered in my medical decision  making (see chart for details).   Patient returns to the emergency department after returning home but again verbalizing some aggressive/homicidal thoughts toward his wife.  He has a history of dementia and does not recall this specifically.  Had recent evaluation in the [facility] emergency department.  He was ultimately cleared for discharge but returns shortly afterward.  Discussed the case with [place] and reviewed [place] note.  Behavioral health has placed as needed orders.  Lab work here is reassuring.  No leukocytosis, acute kidney injury, or other medical cause for symptoms. Plan for placement. Home medications and PRN medications ordered along with diet.    ____________________________________________  FINAL CLINICAL IMPRESSION(S) / ED DIAGNOSES  Final diagnoses:  Homicidal ideation  Alzheimer's dementia with mood disturbance, unspecified dementia severity, unspecified timing of dementia onset ([place])     MEDICATIONS GIVEN DURING THIS  VISIT:  Medications  mirtazapine (REMERON) tablet 7.5 mg (7.5 mg Oral Given 07/16/21 0118)  memantine (NAMENDA) tablet 5 mg (has no administration in time range)  clonazePAM (KLONOPIN) tablet 0.5 mg (has no administration in time range)  ARIPiprazole (ABILIFY) tablet 10 mg (has no administration in time range)  haloperidol (HALDOL) tablet 5 mg (has no administration in time range)    Or  haloperidol lactate (HALDOL) injection 5 mg (has no administration in time range)  benztropine (COGENTIN) tablet 1 mg (1 mg Oral Given 07/16/21 0118)    Or  benztropine mesylate (COGENTIN) injection 1 mg ( Intramuscular See Alternative 07/16/21 0118)  ARIPiprazole (ABILIFY) tablet 10 mg (has no administration in time range)  aspirin EC tablet 81 mg (has no administration in time range)  clonazePAM (KLONOPIN) tablet 0.5 mg (has no administration in time range)  doxazosin (CARDURA) tablet 2 mg (has no administration in time range)  escitalopram (LEXAPRO) tablet 10 mg (has no administration in time range)  gabapentin (NEURONTIN) tablet 1,200 mg (has no administration in time range)  haloperidol (HALDOL) tablet 2 mg (has no administration in time range)  rosuvastatin (CRESTOR) tablet 20 mg (has no administration in time range)     Note:  This document was prepared using Dragon voice recognition software and may include unintentional dictation errors.  Nanda Quinton, MD, [facility] Emergency Medicine    Demani Weyrauch, Wonda Olds, MD 07/17/21 0230

## 2021-07-16 NOTE — BH Assessment (Signed)
Care Management - Follow Up [facility] Discharges   Per chart review, patient was transfered from [facility] to [facility] for further evaluation.

## 2021-07-16 NOTE — Progress Notes (Signed)
Patient has been denied by [facility] for geri psych placement and has been faxed out. Patient meets [facility] inpatient criteria per [name], PA-C. Patient has been faxed out to the following facilities:   [facility]  [address] [place] [address] [phone]  [place], [place] [address] [phone]  [facility]  [place], [place] [address] [phone]  [facility]  [address], [place] [address] [phone] [facility]  [place], [place] [address] [phone] [facility]  [address], [place] [address] [phone] [facility]  [address], [place] [address] [place]  [facility]  [address], [place] [address] [phone] [place]  [address] [place] [address] [phone] [place]  [address] [place] [address] [phone] [facility]  [address], [place] [address] [phone]  [facility]  [address] [place] [address] [phone] [facility]  [place], [place] [address] [place]  [facility]  [address], [place] [address] [phone]  [facility]  [address] [place] [address] [phone] [place], MSW, LCSW-A  12:07 PM 07/16/2021

## 2021-07-16 NOTE — ED Notes (Signed)
Sitter at bedside.

## 2021-07-16 NOTE — ED Provider Notes (Signed)
IVC has been filled.  Patient to be admitted to [facility] for [facility] psych under Dr. Rodman Key.   Lennice Sites, DO 07/16/21 1259

## 2021-07-16 NOTE — ED Notes (Signed)
Pt making repeated attempts to get out of bed; pt moved closer to nurses station, directed to not get up

## 2021-07-16 NOTE — ED Notes (Signed)
Spoke with Corporal Penrod if [employer], notified the pt is ready for transport to [facility]; per [employer], transport available in approx 1 hour, will notify this RN when available

## 2021-07-16 NOTE — ED Notes (Signed)
Lunch ordered 

## 2021-07-16 NOTE — ED Notes (Signed)
Pt changed into purple scrubs and wanded by security  

## 2021-07-16 NOTE — ED Notes (Signed)
Pt continues to get out of bed despite attempted redirection; sitter at bedside; dr Ronnald Nian notified; verbal order given for 1 mg ativan po

## 2021-07-16 NOTE — ED Notes (Signed)
Patient taken to the bathroom.  Patient urinated a small amount.  Patient taken back to bed and given warm blankets.  Patient continues to be confused, attempting to get out of bed.

## 2021-07-16 NOTE — ED Notes (Signed)
Spoke with Claiborne Billings, RN at [facility] in [place], who states they have accepted pt to their facility; per Claiborne Billings, IVC required for admission; Dr. Ronnald Nian notified; report called to [facility], accepting RN at facility

## 2021-07-17 ENCOUNTER — Other Ambulatory Visit: Payer: Self-pay | Admitting: Medical

## 2021-07-17 DIAGNOSIS — Z79899 Other long term (current) drug therapy: Secondary | ICD-10-CM | POA: Diagnosis not present

## 2021-07-17 DIAGNOSIS — N390 Urinary tract infection, site not specified: Secondary | ICD-10-CM | POA: Diagnosis not present

## 2021-07-18 DIAGNOSIS — Z79899 Other long term (current) drug therapy: Secondary | ICD-10-CM | POA: Diagnosis not present

## 2021-07-19 DIAGNOSIS — I10 Essential (primary) hypertension: Secondary | ICD-10-CM | POA: Diagnosis not present

## 2021-07-19 DIAGNOSIS — F29 Unspecified psychosis not due to a substance or known physiological condition: Secondary | ICD-10-CM | POA: Diagnosis not present

## 2021-07-20 DIAGNOSIS — F29 Unspecified psychosis not due to a substance or known physiological condition: Secondary | ICD-10-CM | POA: Diagnosis not present

## 2021-07-20 DIAGNOSIS — I10 Essential (primary) hypertension: Secondary | ICD-10-CM | POA: Diagnosis not present

## 2021-07-21 DIAGNOSIS — R7303 Prediabetes: Secondary | ICD-10-CM | POA: Diagnosis not present

## 2021-07-21 DIAGNOSIS — F29 Unspecified psychosis not due to a substance or known physiological condition: Secondary | ICD-10-CM | POA: Diagnosis not present

## 2021-07-21 DIAGNOSIS — Z712 Person consulting for explanation of examination or test findings: Secondary | ICD-10-CM | POA: Diagnosis not present

## 2021-07-22 DIAGNOSIS — I10 Essential (primary) hypertension: Secondary | ICD-10-CM | POA: Diagnosis not present

## 2021-07-22 DIAGNOSIS — E119 Type 2 diabetes mellitus without complications: Secondary | ICD-10-CM | POA: Diagnosis not present

## 2021-07-22 DIAGNOSIS — F29 Unspecified psychosis not due to a substance or known physiological condition: Secondary | ICD-10-CM | POA: Diagnosis not present

## 2021-07-23 DIAGNOSIS — I10 Essential (primary) hypertension: Secondary | ICD-10-CM | POA: Diagnosis not present

## 2021-07-23 DIAGNOSIS — F29 Unspecified psychosis not due to a substance or known physiological condition: Secondary | ICD-10-CM | POA: Diagnosis not present

## 2021-07-24 DIAGNOSIS — Z79899 Other long term (current) drug therapy: Secondary | ICD-10-CM | POA: Diagnosis not present

## 2021-07-26 DIAGNOSIS — R509 Fever, unspecified: Secondary | ICD-10-CM | POA: Diagnosis not present

## 2021-07-26 DIAGNOSIS — R630 Anorexia: Secondary | ICD-10-CM | POA: Diagnosis not present

## 2021-07-26 DIAGNOSIS — R4182 Altered mental status, unspecified: Secondary | ICD-10-CM | POA: Diagnosis not present

## 2021-07-26 DIAGNOSIS — F29 Unspecified psychosis not due to a substance or known physiological condition: Secondary | ICD-10-CM | POA: Diagnosis not present

## 2021-07-27 DIAGNOSIS — F0390 Unspecified dementia without behavioral disturbance: Secondary | ICD-10-CM | POA: Diagnosis not present

## 2021-07-27 DIAGNOSIS — Z79899 Other long term (current) drug therapy: Secondary | ICD-10-CM | POA: Diagnosis not present

## 2021-07-27 DIAGNOSIS — F29 Unspecified psychosis not due to a substance or known physiological condition: Secondary | ICD-10-CM | POA: Diagnosis not present

## 2021-07-27 DIAGNOSIS — R630 Anorexia: Secondary | ICD-10-CM | POA: Diagnosis not present

## 2021-07-27 DIAGNOSIS — R451 Restlessness and agitation: Secondary | ICD-10-CM | POA: Diagnosis not present

## 2021-07-28 DIAGNOSIS — F29 Unspecified psychosis not due to a substance or known physiological condition: Secondary | ICD-10-CM | POA: Diagnosis not present

## 2021-07-28 DIAGNOSIS — F0390 Unspecified dementia without behavioral disturbance: Secondary | ICD-10-CM | POA: Diagnosis not present

## 2021-07-28 DIAGNOSIS — I10 Essential (primary) hypertension: Secondary | ICD-10-CM | POA: Diagnosis not present

## 2021-07-28 DIAGNOSIS — R451 Restlessness and agitation: Secondary | ICD-10-CM | POA: Diagnosis not present

## 2021-07-29 DIAGNOSIS — R451 Restlessness and agitation: Secondary | ICD-10-CM | POA: Diagnosis not present

## 2021-07-29 DIAGNOSIS — G4709 Other insomnia: Secondary | ICD-10-CM | POA: Diagnosis not present

## 2021-07-29 DIAGNOSIS — F29 Unspecified psychosis not due to a substance or known physiological condition: Secondary | ICD-10-CM | POA: Diagnosis not present

## 2021-07-29 DIAGNOSIS — I10 Essential (primary) hypertension: Secondary | ICD-10-CM | POA: Diagnosis not present

## 2021-07-30 DIAGNOSIS — N400 Enlarged prostate without lower urinary tract symptoms: Secondary | ICD-10-CM | POA: Diagnosis not present

## 2021-07-30 DIAGNOSIS — G301 Alzheimer's disease with late onset: Secondary | ICD-10-CM | POA: Diagnosis not present

## 2021-07-30 DIAGNOSIS — Z79899 Other long term (current) drug therapy: Secondary | ICD-10-CM | POA: Diagnosis not present

## 2021-07-30 DIAGNOSIS — R6889 Other general symptoms and signs: Secondary | ICD-10-CM | POA: Diagnosis not present

## 2021-07-30 DIAGNOSIS — Z139 Encounter for screening, unspecified: Secondary | ICD-10-CM | POA: Diagnosis not present

## 2021-07-30 DIAGNOSIS — F29 Unspecified psychosis not due to a substance or known physiological condition: Secondary | ICD-10-CM | POA: Diagnosis not present

## 2021-07-30 DIAGNOSIS — F0390 Unspecified dementia without behavioral disturbance: Secondary | ICD-10-CM | POA: Diagnosis not present

## 2021-07-30 DIAGNOSIS — Z5181 Encounter for therapeutic drug level monitoring: Secondary | ICD-10-CM | POA: Diagnosis not present

## 2021-08-02 DIAGNOSIS — R6889 Other general symptoms and signs: Secondary | ICD-10-CM | POA: Diagnosis not present

## 2021-08-02 DIAGNOSIS — R4182 Altered mental status, unspecified: Secondary | ICD-10-CM | POA: Diagnosis not present

## 2021-08-02 DIAGNOSIS — Z5181 Encounter for therapeutic drug level monitoring: Secondary | ICD-10-CM | POA: Diagnosis not present

## 2021-08-02 DIAGNOSIS — Z79899 Other long term (current) drug therapy: Secondary | ICD-10-CM | POA: Diagnosis not present

## 2021-08-05 ENCOUNTER — Telehealth: Payer: Self-pay

## 2021-08-05 NOTE — Telephone Encounter (Signed)
Wife called and wanted to let you know that pt had to go into Assisted Living and she wanted to let you know

## 2021-08-07 NOTE — Telephone Encounter (Signed)
done

## 2021-08-08 ENCOUNTER — Other Ambulatory Visit: Payer: Self-pay

## 2021-08-08 ENCOUNTER — Encounter (HOSPITAL_BASED_OUTPATIENT_CLINIC_OR_DEPARTMENT_OTHER): Payer: Self-pay

## 2021-08-08 ENCOUNTER — Emergency Department (HOSPITAL_BASED_OUTPATIENT_CLINIC_OR_DEPARTMENT_OTHER)
Admission: EM | Admit: 2021-08-08 | Discharge: 2021-08-09 | Disposition: A | Discharge status: Home / Self Care | Payer: Medicare HMO | Attending: Emergency Medicine | Admitting: Emergency Medicine

## 2021-08-08 ENCOUNTER — Emergency Department (HOSPITAL_BASED_OUTPATIENT_CLINIC_OR_DEPARTMENT_OTHER): Payer: Medicare HMO

## 2021-08-08 DIAGNOSIS — K625 Hemorrhage of anus and rectum: Secondary | ICD-10-CM | POA: Diagnosis not present

## 2021-08-08 DIAGNOSIS — R58 Hemorrhage, not elsewhere classified: Secondary | ICD-10-CM | POA: Diagnosis not present

## 2021-08-08 DIAGNOSIS — F0390 Unspecified dementia without behavioral disturbance: Secondary | ICD-10-CM | POA: Insufficient documentation

## 2021-08-08 DIAGNOSIS — R1010 Upper abdominal pain, unspecified: Secondary | ICD-10-CM | POA: Diagnosis not present

## 2021-08-08 DIAGNOSIS — L22 Diaper dermatitis: Secondary | ICD-10-CM | POA: Diagnosis not present

## 2021-08-08 DIAGNOSIS — Z87891 Personal history of nicotine dependence: Secondary | ICD-10-CM | POA: Diagnosis not present

## 2021-08-08 DIAGNOSIS — R109 Unspecified abdominal pain: Secondary | ICD-10-CM | POA: Diagnosis not present

## 2021-08-08 DIAGNOSIS — L299 Pruritus, unspecified: Secondary | ICD-10-CM | POA: Diagnosis not present

## 2021-08-08 DIAGNOSIS — R001 Bradycardia, unspecified: Secondary | ICD-10-CM | POA: Diagnosis not present

## 2021-08-08 LAB — CBC WITH DIFFERENTIAL/PLATELET
Abs Immature Granulocytes: 0.03 10*3/uL (ref 0.00–0.07)
Basophils Absolute: 0.1 10*3/uL (ref 0.0–0.1)
Basophils Relative: 1 %
Eosinophils Absolute: 0.1 10*3/uL (ref 0.0–0.5)
Eosinophils Relative: 2 %
HCT: 37.4 % — ABNORMAL LOW (ref 39.0–52.0)
Hemoglobin: 12.1 g/dL — ABNORMAL LOW (ref 13.0–17.0)
Immature Granulocytes: 0 %
Lymphocytes Relative: 19 %
Lymphs Abs: 1.6 10*3/uL (ref 0.7–4.0)
MCH: 30 pg (ref 26.0–34.0)
MCHC: 32.4 g/dL (ref 30.0–36.0)
MCV: 92.6 fL (ref 80.0–100.0)
Monocytes Absolute: 0.7 10*3/uL (ref 0.1–1.0)
Monocytes Relative: 9 %
Neutro Abs: 5.9 10*3/uL (ref 1.7–7.7)
Neutrophils Relative %: 69 %
Platelets: 294 10*3/uL (ref 150–400)
RBC: 4.04 MIL/uL — ABNORMAL LOW (ref 4.22–5.81)
RDW: 12.4 % (ref 11.5–15.5)
WBC: 8.5 10*3/uL (ref 4.0–10.5)
nRBC: 0 % (ref 0.0–0.2)

## 2021-08-08 LAB — COMPREHENSIVE METABOLIC PANEL
ALT: 52 U/L — ABNORMAL HIGH (ref 0–44)
AST: 57 U/L — ABNORMAL HIGH (ref 15–41)
Albumin: 3.5 g/dL (ref 3.5–5.0)
Alkaline Phosphatase: 62 U/L (ref 38–126)
Anion gap: 7 (ref 5–15)
BUN: 17 mg/dL (ref 8–23)
CO2: 26 mmol/L (ref 22–32)
Calcium: 8.5 mg/dL — ABNORMAL LOW (ref 8.9–10.3)
Chloride: 103 mmol/L (ref 98–111)
Creatinine, Ser: 0.86 mg/dL (ref 0.61–1.24)
GFR, Estimated: >60 mL/min (ref >60)
Glucose, Bld: 124 mg/dL — ABNORMAL HIGH (ref 70–99)
Potassium: 4 mmol/L (ref 3.5–5.1)
Sodium: 136 mmol/L (ref 135–145)
Total Bilirubin: 0.4 mg/dL (ref 0.3–1.2)
Total Protein: 6 g/dL — ABNORMAL LOW (ref 6.5–8.1)

## 2021-08-08 LAB — OCCULT BLOOD X 1 CARD TO LAB, STOOL: Fecal Occult Bld: POSITIVE — AB

## 2021-08-08 LAB — LACTIC ACID, PLASMA: Lactic Acid, Venous: 1 mmol/L (ref 0.5–1.9)

## 2021-08-08 MED ORDER — SODIUM CHLORIDE 0.9 % IV BOLUS
1000.0000 mL | Freq: Once | INTRAVENOUS | Status: AC
  Administered 2021-08-08: 1000 mL via INTRAVENOUS

## 2021-08-08 MED ORDER — PIPERACILLIN-TAZOBACTAM 3.375 G IVPB 30 MIN
3.3750 g | Freq: Once | INTRAVENOUS | Status: AC
  Administered 2021-08-08: 3.375 g via INTRAVENOUS
  Filled 2021-08-08: qty 50

## 2021-08-08 MED ORDER — IOHEXOL 300 MG/ML  SOLN
100.0000 mL | Freq: Once | INTRAMUSCULAR | Status: AC | PRN
  Administered 2021-08-08: 100 mL via INTRAVENOUS

## 2021-08-08 NOTE — ED Provider Notes (Signed)
[facility] Provider Note   CSN: [id number] Arrival date & time: 08/08/21  2127     History Chief Complaint  Patient presents with   Rectal Bleeding    Paul Klein is a 65 y.o. male hx of dementia, diverticulosis, hemorrhoids, here presenting with possible rectal bleeding.  Patient is demented and from her facility.  Patient was told staff that he was scratching his buttock and apparently has some blood on his hands.  Staff member did not do a rectal exam.  I sent him here for further evaluation.  Patient is demented unable to give much history.  Per chart review, patient is not on any blood thinners currently.  The history is provided by the patient.      Past Medical History:  Diagnosis Date   Alzheimer's disease ([place])    Anxiety    Dr. Toy Care   Diverticulosis of colon    sigmoid, per colonoscopy 2014, Dr. Benson Norway   Dyslipidemia    Hemorrhoids    internal and externa per 2014 colonoscopy   Insomnia    uses Neurontin ( Dr. Toy Care)   Normal cardiac stress test 12/2012   performed due to risk factors, abnormal EKG; Dr. Wynonia Lawman   Panic attack    Second hand smoke exposure    wife    Patient Active Problem List   Diagnosis Date Noted   Dementia with behavioral disturbance 07/09/2021   External hemorrhoid 06/02/2021   Hallucination 06/02/2021   Anxiety 06/02/2021   Rectal discomfort 06/02/2021   Advance directive discussed with patient 01/25/2021   Need for pneumococcal vaccination 01/25/2021   Hyperlipidemia 11/30/2020   Encounter for health maintenance examination in adult 11/30/2020   Medicare annual wellness visit, subsequent 11/30/2020   Alzheimer disease ([place]) 11/30/2020   Abnormal albumin 07/07/2020   Impaired fasting blood sugar 07/07/2020   Atherosclerosis of coronary artery of native heart without angina pectoris 07/07/2020   Aortic atherosclerosis ([place]) 07/07/2020   Essential hypertension, benign 04/29/2020   Urinary frequency  04/29/2020   Overactive bladder 04/29/2020   Benign prostatic hyperplasia with urinary frequency 04/29/2020   Screen for colon cancer 04/29/2020   Dementia without behavioral disturbance ([place]) 04/15/2020   Abdominal discomfort 04/15/2020   Frequent urination 04/15/2020   Constipation 10/15/2019   Bradycardia 07/18/2018   Onychomycosis 08/30/2016   Insomnia 08/03/2015   Generalized anxiety disorder 08/03/2015   Screening for prostate cancer 08/03/2015   Need for influenza vaccination 08/03/2015   Vaccine counseling 08/03/2015   Second hand tobacco smoke exposure 07/31/2014   Dyslipidemia 07/31/2014    Past Surgical History:  Procedure Laterality Date   ANAL FISSURE REPAIR     COLONOSCOPY  02/19/13   diverticulosis of sigmoid colon, external and internal hemorrhoids, Dr. Benson Norway, repeat in 10 years   SKIN BIOPSY     face, back   WISDOM TOOTH EXTRACTION         Family History  Problem Relation Age of Onset   Heart disease Mother        BYPASS HEART SURGERY   Depression Mother    Mental illness Mother    Cancer Mother        skin   Dementia Mother    Hypertension Father    Benign prostatic hyperplasia Father    Cancer Father        skin   Heart disease Father 20       CABG   Depression Sister    Stroke Maternal  Grandmother    Diabetes Paternal Grandfather     Social History   Tobacco Use   Smoking status: Former    Packs/day: 2.00    Years: 6.00    Pack years: 12.00    Types: Cigarettes    Quit date: 1970    Years since quitting: 52.9   Smokeless tobacco: Never  Vaping Use   Vaping Use: Never used  Substance Use Topics   Alcohol use: Yes    Comment: occ   Drug use: No    Comment: marijuana in the past    Home Medications Prior to Admission medications   Medication Sig Start Date End Date Taking? Authorizing Provider  ARIPiprazole (ABILIFY) 5 MG tablet Take 10 mg by mouth 2 (two) times daily. 06/27/21   [provider]  ASPIRIN LOW DOSE 81 MG  EC tablet TAKE 1 TABLET(81 MG) BY MOUTH DAILY Patient taking differently: Take 81 mg by mouth at bedtime. 06/28/21   Tysinger, Camelia Eng, PA-C  clonazePAM (KLONOPIN) 0.5 MG tablet Take 1 tablet (0.5 mg total) by mouth daily as needed for up to 20 days for anxiety. Patient taking differently: Take 0.5 mg by mouth in the morning, at noon, and at bedtime. 06/15/21 07/16/21  Tysinger, Camelia Eng, PA-C  doxazosin (CARDURA) 2 MG tablet Take 1 tablet (2 mg total) by mouth daily. Patient taking differently: Take 2 mg by mouth at bedtime. 02/24/21 02/24/22  Tysinger, Camelia Eng, PA-C  escitalopram (LEXAPRO) 10 MG tablet Take 1 tablet (10 mg total) by mouth daily. Patient taking differently: Take 10 mg by mouth at bedtime. 02/23/21   Cameron Sprang, MD  gabapentin (NEURONTIN) 800 MG tablet Take 1.5 tablets (1,200 mg total) by mouth at bedtime. 04/21/21   Cameron Sprang, MD  haloperidol (HALDOL) 2 MG tablet Take 2 mg by mouth daily. 06/27/21   [provider]  hydrocortisone 2.5 % cream Apply topically 2 (two) times daily. Patient not taking: No sig reported 06/02/21   Tysinger, Camelia Eng, PA-C  melatonin 3 MG TABS tablet Take 1 tablet (3 mg total) by mouth at bedtime. Patient not taking: No sig reported 09/11/20   Cameron Sprang, MD  Multiple Vitamin (MULTIVITAMIN) tablet Take 1 tablet by mouth at bedtime.    [provider]  Omega-3 Fatty Acids (FISH OIL) 1000 MG CAPS Take 2,000 mg by mouth at bedtime.    [provider]  QUEtiapine (SEROQUEL) 25 MG tablet Take 1 tablet (25 mg total) by mouth 2 (two) times daily. Patient not taking: No sig reported 06/02/21   Tysinger, Camelia Eng, PA-C  rosuvastatin (CRESTOR) 20 MG tablet Take 1 tablet (20 mg total) by mouth at bedtime. 07/19/21   Tysinger, Camelia Eng, PA-C    Allergies    Prozac [fluoxetine hcl]  Review of Systems   Review of Systems  Gastrointestinal:  Positive for blood in stool.  All other systems reviewed and are negative.  Physical  Exam Updated Vital Signs BP 109/80   Pulse (!) 53   Temp 98.2 F (36.8 C) (Oral)   Resp 18   Ht 5\' 11"  (1.803 m)   Wt 83.9 kg   SpO2 98%   BMI 25.80 kg/m   Physical Exam Vitals and nursing note reviewed.  Constitutional:      Comments: Demented  HENT:     Head: Normocephalic.     Nose: Nose normal.     Mouth/Throat:     Mouth: Mucous membranes are moist.  Eyes:     Extraocular Movements: Extraocular movements intact.     Pupils: Pupils are equal, round, and reactive to light.  Cardiovascular:     Rate and Rhythm: Normal rate and regular rhythm.     Pulses: Normal pulses.     Heart sounds: Normal heart sounds.  Pulmonary:     Effort: Pulmonary effort is normal.     Breath sounds: Normal breath sounds.  Abdominal:     General: Abdomen is flat.     Palpations: Abdomen is soft.  Genitourinary:    Comments: Rectal- redness in the perirectal area. Some scrotal redness, no obvious fluctuance. No obvious hemorrhoid, dark stool but no obvious blood  Musculoskeletal:     Cervical back: Normal range of motion and neck supple.  Skin:    General: Skin is warm.     Capillary Refill: Capillary refill takes less than 2 seconds.  Neurological:     General: No focal deficit present.     Mental Status: He is oriented to person, place, and time.  Psychiatric:        Mood and Affect: Mood normal.        Behavior: Behavior normal.    ED Results / Procedures / Treatments   Labs (all labs ordered are listed, but only abnormal results are displayed) Labs Reviewed  CBC WITH DIFFERENTIAL/PLATELET - Abnormal; Notable for the following components:      Result Value   RBC 4.04 (*)    Hemoglobin 12.1 (*)    HCT 37.4 (*)    All other components within normal limits  OCCULT BLOOD X 1 CARD TO LAB, STOOL - Abnormal; Notable for the following components:   Fecal Occult Bld POSITIVE (*)    All other components within normal limits  COMPREHENSIVE METABOLIC PANEL  LACTIC ACID, PLASMA   LACTIC ACID, PLASMA    EKG None  Radiology No results found.  Procedures Procedures   Medications Ordered in ED Medications  sodium chloride 0.9 % bolus 1,000 mL (1,000 mLs Intravenous New Bag/Given 08/08/21 2225)  piperacillin-tazobactam (ZOSYN) IVPB 3.375 g (3.375 g Intravenous New Bag/Given 08/08/21 2227)    ED Course  I have reviewed the triage vital signs and the nursing notes.  Pertinent labs & imaging results that were available during my care of the patient were reviewed by me and considered in my medical decision making (see chart for details).    MDM Rules/Calculators/A&P                           DAVIAN HANSHAW is a 65 y.o. male here with possible blood in stool. I don't see any hemorrhoids on my exam.  There is some redness in the perineum area.  I think likely inflammation versus cellulitis.  We will get a CT to rule out perirectal abscess or Fournier's gangrene.  We will get CBC and CMP.  We will do CT abdomen pelvis  11:22 PM Blood cell count is normal.  His hemoglobin is 12 and is down from 14.  CT abdomen pelvis is pending.  Since his hemoglobin dropped 2 points, anticipate that patient will need admission for GI bleed even if CT does not show any obvious cellulitis or abscess. Signed out to Dr. Stark Jock in the ED    Final Clinical Impression(s) / ED Diagnoses Final diagnoses:  None    Rx / DC Orders ED Discharge Orders     None  Drenda Freeze, MD 08/08/21 [phone]

## 2021-08-08 NOTE — ED Triage Notes (Signed)
Hx of dementia, staff at assisted living told ems that he has been scratching and gets blood on his hands. They did not see the bleeding themselves but was reported in shift change so they sent him out to be evaluated

## 2021-08-09 DIAGNOSIS — M2550 Pain in unspecified joint: Secondary | ICD-10-CM | POA: Diagnosis not present

## 2021-08-09 DIAGNOSIS — K625 Hemorrhage of anus and rectum: Secondary | ICD-10-CM | POA: Diagnosis not present

## 2021-08-09 DIAGNOSIS — Z7401 Bed confinement status: Secondary | ICD-10-CM | POA: Diagnosis not present

## 2021-08-09 LAB — LACTIC ACID, PLASMA: Lactic Acid, Venous: 1.4 mmol/L (ref 0.5–1.9)

## 2021-08-09 NOTE — Discharge Instructions (Signed)
Perform frequent changes of his depends.  Return to the emergency department for high fevers, severe pain, worsening bleeding, or other new and concerning symptoms.

## 2021-08-09 NOTE — ED Provider Notes (Signed)
  Physical Exam  BP 105/61   Pulse (!) 47   Temp 98.2 F (36.8 C) (Oral)   Resp 10   Ht 5\' 11"  (1.803 m)   Wt 83.9 kg   SpO2 100%   BMI 25.80 kg/m   Physical Exam Vitals and nursing note reviewed.  Constitutional:      Comments: Patient is somnolent, but arousable.  He has baseline dementia and is disoriented.  Pulmonary:     Effort: Pulmonary effort is normal.  Abdominal:     General: There is no distension.     Tenderness: There is no abdominal tenderness.  Genitourinary:    Comments: Inspection of his perianal soft tissues and testicles reveals some irritation, but no evidence for abscess or crepitus.  There is brown stool exuding from the rectum.  There is no melena or bright red blood. Skin:    General: Skin is warm and dry.    ED Course/Procedures     Procedures  MDM  Care assumed from Dr. Darl Householder at shift change.  Patient awaiting results of CT scan of the abdomen and pelvis.  Patient apparently had some blood on his fingers and staff was concerned that he had rectal bleeding.  Upon my reexamination of the patient's rectum and scrotum, there is some skin irritation, but no evidence for Fournier's gangrene.  His CT scan shows no evidence for gas.  I feel as though the tissues in this area are irritated from being in a depends.  Patient does not appear to be having any rectal bleeding.  Stool is brown.  He is afebrile with no white count and abdominal exam is benign.  At this point, I see no indication for admission and feel as though patient can safely be discharged and return to the ER if additional problems ensue.         Veryl Speak, MD 08/09/21 [phone]

## 2021-08-09 NOTE — ED Notes (Signed)
Pt had a period of apnea, sternal rub woke him up, resting hr in the 40's

## 2021-08-11 DIAGNOSIS — E785 Hyperlipidemia, unspecified: Secondary | ICD-10-CM | POA: Diagnosis not present

## 2021-08-11 DIAGNOSIS — G309 Alzheimer's disease, unspecified: Secondary | ICD-10-CM | POA: Diagnosis not present

## 2021-08-11 DIAGNOSIS — I959 Hypotension, unspecified: Secondary | ICD-10-CM | POA: Diagnosis not present

## 2021-08-11 DIAGNOSIS — J189 Pneumonia, unspecified organism: Secondary | ICD-10-CM | POA: Diagnosis not present

## 2021-08-11 DIAGNOSIS — Z20822 Contact with and (suspected) exposure to covid-19: Secondary | ICD-10-CM | POA: Diagnosis not present

## 2021-08-11 DIAGNOSIS — R2689 Other abnormalities of gait and mobility: Secondary | ICD-10-CM | POA: Diagnosis not present

## 2021-08-11 DIAGNOSIS — R509 Fever, unspecified: Secondary | ICD-10-CM | POA: Diagnosis not present

## 2021-08-11 DIAGNOSIS — I491 Atrial premature depolarization: Secondary | ICD-10-CM | POA: Diagnosis not present

## 2021-08-11 DIAGNOSIS — G9349 Other encephalopathy: Secondary | ICD-10-CM | POA: Diagnosis not present

## 2021-08-11 DIAGNOSIS — F0280 Dementia in other diseases classified elsewhere without behavioral disturbance: Secondary | ICD-10-CM | POA: Diagnosis not present

## 2021-08-11 DIAGNOSIS — F02818 Dementia in other diseases classified elsewhere, unspecified severity, with other behavioral disturbance: Secondary | ICD-10-CM | POA: Diagnosis not present

## 2021-08-11 DIAGNOSIS — R609 Edema, unspecified: Secondary | ICD-10-CM | POA: Diagnosis not present

## 2021-08-11 DIAGNOSIS — R410 Disorientation, unspecified: Secondary | ICD-10-CM | POA: Diagnosis not present

## 2021-08-11 DIAGNOSIS — R404 Transient alteration of awareness: Secondary | ICD-10-CM | POA: Diagnosis not present

## 2021-08-11 DIAGNOSIS — B348 Other viral infections of unspecified site: Secondary | ICD-10-CM | POA: Diagnosis not present

## 2021-08-11 DIAGNOSIS — G9340 Encephalopathy, unspecified: Secondary | ICD-10-CM | POA: Diagnosis not present

## 2021-08-11 DIAGNOSIS — I10 Essential (primary) hypertension: Secondary | ICD-10-CM | POA: Diagnosis not present

## 2021-08-11 DIAGNOSIS — N400 Enlarged prostate without lower urinary tract symptoms: Secondary | ICD-10-CM | POA: Diagnosis not present

## 2021-08-11 DIAGNOSIS — G4700 Insomnia, unspecified: Secondary | ICD-10-CM | POA: Diagnosis not present

## 2021-08-12 DIAGNOSIS — G309 Alzheimer's disease, unspecified: Secondary | ICD-10-CM | POA: Diagnosis not present

## 2021-08-12 DIAGNOSIS — F0280 Dementia in other diseases classified elsewhere without behavioral disturbance: Secondary | ICD-10-CM | POA: Diagnosis not present

## 2021-08-12 DIAGNOSIS — R509 Fever, unspecified: Secondary | ICD-10-CM | POA: Diagnosis not present

## 2021-08-12 DIAGNOSIS — E785 Hyperlipidemia, unspecified: Secondary | ICD-10-CM | POA: Diagnosis not present

## 2021-08-12 DIAGNOSIS — G9340 Encephalopathy, unspecified: Secondary | ICD-10-CM | POA: Diagnosis not present

## 2021-08-12 DIAGNOSIS — G4700 Insomnia, unspecified: Secondary | ICD-10-CM | POA: Diagnosis not present

## 2021-08-13 DIAGNOSIS — I10 Essential (primary) hypertension: Secondary | ICD-10-CM | POA: Diagnosis not present

## 2021-08-13 DIAGNOSIS — J189 Pneumonia, unspecified organism: Secondary | ICD-10-CM | POA: Diagnosis not present

## 2021-08-13 DIAGNOSIS — Z743 Need for continuous supervision: Secondary | ICD-10-CM | POA: Diagnosis not present

## 2021-08-13 DIAGNOSIS — N400 Enlarged prostate without lower urinary tract symptoms: Secondary | ICD-10-CM | POA: Diagnosis not present

## 2021-08-13 DIAGNOSIS — R509 Fever, unspecified: Secondary | ICD-10-CM | POA: Diagnosis not present

## 2021-08-16 DIAGNOSIS — R4681 Obsessive-compulsive behavior: Secondary | ICD-10-CM | POA: Diagnosis not present

## 2021-08-16 DIAGNOSIS — N400 Enlarged prostate without lower urinary tract symptoms: Secondary | ICD-10-CM | POA: Diagnosis not present

## 2021-08-16 DIAGNOSIS — G4700 Insomnia, unspecified: Secondary | ICD-10-CM | POA: Diagnosis not present

## 2021-08-16 DIAGNOSIS — R443 Hallucinations, unspecified: Secondary | ICD-10-CM | POA: Diagnosis not present

## 2021-08-16 DIAGNOSIS — R4689 Other symptoms and signs involving appearance and behavior: Secondary | ICD-10-CM | POA: Diagnosis not present

## 2021-08-16 DIAGNOSIS — E785 Hyperlipidemia, unspecified: Secondary | ICD-10-CM | POA: Diagnosis not present

## 2021-08-17 DIAGNOSIS — E7849 Other hyperlipidemia: Secondary | ICD-10-CM | POA: Diagnosis not present

## 2021-08-17 DIAGNOSIS — E119 Type 2 diabetes mellitus without complications: Secondary | ICD-10-CM | POA: Diagnosis not present

## 2021-08-17 DIAGNOSIS — D518 Other vitamin B12 deficiency anemias: Secondary | ICD-10-CM | POA: Diagnosis not present

## 2021-08-17 DIAGNOSIS — Z79899 Other long term (current) drug therapy: Secondary | ICD-10-CM | POA: Diagnosis not present

## 2021-08-21 DIAGNOSIS — R001 Bradycardia, unspecified: Secondary | ICD-10-CM | POA: Diagnosis not present

## 2021-08-21 DIAGNOSIS — F0390 Unspecified dementia without behavioral disturbance: Secondary | ICD-10-CM | POA: Diagnosis not present

## 2021-08-21 DIAGNOSIS — Z743 Need for continuous supervision: Secondary | ICD-10-CM | POA: Diagnosis not present

## 2021-08-21 DIAGNOSIS — B49 Unspecified mycosis: Secondary | ICD-10-CM | POA: Diagnosis not present

## 2021-08-21 DIAGNOSIS — R600 Localized edema: Secondary | ICD-10-CM | POA: Diagnosis not present

## 2021-08-21 DIAGNOSIS — I700 Atherosclerosis of aorta: Secondary | ICD-10-CM | POA: Diagnosis not present

## 2021-08-21 DIAGNOSIS — R609 Edema, unspecified: Secondary | ICD-10-CM | POA: Diagnosis not present

## 2021-08-22 DIAGNOSIS — M7989 Other specified soft tissue disorders: Secondary | ICD-10-CM | POA: Diagnosis not present

## 2021-08-22 DIAGNOSIS — R001 Bradycardia, unspecified: Secondary | ICD-10-CM | POA: Diagnosis not present

## 2021-08-25 ENCOUNTER — Ambulatory Visit: Payer: Medicare HMO | Admitting: Physician Assistant

## 2021-08-25 ENCOUNTER — Ambulatory Visit: Payer: Medicare HMO | Admitting: Cardiology

## 2021-08-25 DIAGNOSIS — D518 Other vitamin B12 deficiency anemias: Secondary | ICD-10-CM | POA: Diagnosis not present

## 2021-08-25 DIAGNOSIS — E038 Other specified hypothyroidism: Secondary | ICD-10-CM | POA: Diagnosis not present

## 2021-08-25 DIAGNOSIS — Z79899 Other long term (current) drug therapy: Secondary | ICD-10-CM | POA: Diagnosis not present

## 2021-09-07 DIAGNOSIS — M542 Cervicalgia: Secondary | ICD-10-CM | POA: Diagnosis not present

## 2021-09-08 DIAGNOSIS — N400 Enlarged prostate without lower urinary tract symptoms: Secondary | ICD-10-CM | POA: Diagnosis not present

## 2021-09-08 DIAGNOSIS — D518 Other vitamin B12 deficiency anemias: Secondary | ICD-10-CM | POA: Diagnosis not present

## 2021-09-08 DIAGNOSIS — E785 Hyperlipidemia, unspecified: Secondary | ICD-10-CM | POA: Diagnosis not present

## 2021-09-08 DIAGNOSIS — I10 Essential (primary) hypertension: Secondary | ICD-10-CM | POA: Diagnosis not present

## 2021-09-08 DIAGNOSIS — E119 Type 2 diabetes mellitus without complications: Secondary | ICD-10-CM | POA: Diagnosis not present

## 2021-09-08 DIAGNOSIS — E038 Other specified hypothyroidism: Secondary | ICD-10-CM | POA: Diagnosis not present

## 2021-09-08 DIAGNOSIS — E559 Vitamin D deficiency, unspecified: Secondary | ICD-10-CM | POA: Diagnosis not present

## 2021-09-13 DIAGNOSIS — F32A Depression, unspecified: Secondary | ICD-10-CM | POA: Diagnosis not present

## 2021-09-13 DIAGNOSIS — I10 Essential (primary) hypertension: Secondary | ICD-10-CM | POA: Diagnosis not present

## 2021-09-13 DIAGNOSIS — R4689 Other symptoms and signs involving appearance and behavior: Secondary | ICD-10-CM | POA: Diagnosis not present

## 2021-09-13 DIAGNOSIS — N400 Enlarged prostate without lower urinary tract symptoms: Secondary | ICD-10-CM | POA: Diagnosis not present

## 2021-09-13 DIAGNOSIS — F419 Anxiety disorder, unspecified: Secondary | ICD-10-CM | POA: Diagnosis not present

## 2021-09-13 DIAGNOSIS — E785 Hyperlipidemia, unspecified: Secondary | ICD-10-CM | POA: Diagnosis not present

## 2021-09-13 DIAGNOSIS — F22 Delusional disorders: Secondary | ICD-10-CM | POA: Diagnosis not present

## 2021-09-13 DIAGNOSIS — G4700 Insomnia, unspecified: Secondary | ICD-10-CM | POA: Diagnosis not present

## 2021-09-16 DIAGNOSIS — R159 Full incontinence of feces: Secondary | ICD-10-CM | POA: Diagnosis not present

## 2021-09-16 DIAGNOSIS — R32 Unspecified urinary incontinence: Secondary | ICD-10-CM | POA: Diagnosis not present

## 2021-09-19 DIAGNOSIS — G4709 Other insomnia: Secondary | ICD-10-CM | POA: Diagnosis not present

## 2021-09-19 DIAGNOSIS — F03911 Unspecified dementia, unspecified severity, with agitation: Secondary | ICD-10-CM | POA: Diagnosis not present

## 2021-09-19 DIAGNOSIS — G319 Degenerative disease of nervous system, unspecified: Secondary | ICD-10-CM | POA: Diagnosis not present

## 2021-09-19 DIAGNOSIS — Z20822 Contact with and (suspected) exposure to covid-19: Secondary | ICD-10-CM | POA: Diagnosis not present

## 2021-09-19 DIAGNOSIS — G928 Other toxic encephalopathy: Secondary | ICD-10-CM | POA: Diagnosis not present

## 2021-09-19 DIAGNOSIS — F02818 Dementia in other diseases classified elsewhere, unspecified severity, with other behavioral disturbance: Secondary | ICD-10-CM | POA: Diagnosis not present

## 2021-09-19 DIAGNOSIS — F01511 Vascular dementia, unspecified severity, with agitation: Secondary | ICD-10-CM | POA: Diagnosis not present

## 2021-09-19 DIAGNOSIS — S0990XA Unspecified injury of head, initial encounter: Secondary | ICD-10-CM | POA: Diagnosis not present

## 2021-09-19 DIAGNOSIS — R55 Syncope and collapse: Secondary | ICD-10-CM | POA: Diagnosis not present

## 2021-09-19 DIAGNOSIS — F02811 Dementia in other diseases classified elsewhere, unspecified severity, with agitation: Secondary | ICD-10-CM | POA: Diagnosis not present

## 2021-09-19 DIAGNOSIS — R001 Bradycardia, unspecified: Secondary | ICD-10-CM | POA: Diagnosis not present

## 2021-09-19 DIAGNOSIS — E785 Hyperlipidemia, unspecified: Secondary | ICD-10-CM | POA: Diagnosis not present

## 2021-09-19 DIAGNOSIS — R404 Transient alteration of awareness: Secondary | ICD-10-CM | POA: Diagnosis not present

## 2021-09-19 DIAGNOSIS — L03115 Cellulitis of right lower limb: Secondary | ICD-10-CM | POA: Diagnosis not present

## 2021-09-19 DIAGNOSIS — J320 Chronic maxillary sinusitis: Secondary | ICD-10-CM | POA: Diagnosis not present

## 2021-09-19 DIAGNOSIS — R4182 Altered mental status, unspecified: Secondary | ICD-10-CM | POA: Diagnosis not present

## 2021-09-19 DIAGNOSIS — F29 Unspecified psychosis not due to a substance or known physiological condition: Secondary | ICD-10-CM | POA: Diagnosis not present

## 2021-09-19 DIAGNOSIS — L03116 Cellulitis of left lower limb: Secondary | ICD-10-CM | POA: Diagnosis not present

## 2021-09-19 DIAGNOSIS — G309 Alzheimer's disease, unspecified: Secondary | ICD-10-CM | POA: Diagnosis not present

## 2021-09-19 DIAGNOSIS — Z743 Need for continuous supervision: Secondary | ICD-10-CM | POA: Diagnosis not present

## 2021-09-24 DIAGNOSIS — S064X1A Epidural hemorrhage with loss of consciousness of 30 minutes or less, initial encounter: Secondary | ICD-10-CM | POA: Diagnosis not present

## 2021-09-24 DIAGNOSIS — W1839XA Other fall on same level, initial encounter: Secondary | ICD-10-CM | POA: Diagnosis not present

## 2021-09-24 DIAGNOSIS — F0390 Unspecified dementia without behavioral disturbance: Secondary | ICD-10-CM | POA: Diagnosis not present

## 2021-09-24 DIAGNOSIS — K921 Melena: Secondary | ICD-10-CM | POA: Diagnosis not present

## 2021-09-24 DIAGNOSIS — Y998 Other external cause status: Secondary | ICD-10-CM | POA: Diagnosis not present

## 2021-09-24 DIAGNOSIS — F03C11 Unspecified dementia, severe, with agitation: Secondary | ICD-10-CM | POA: Diagnosis not present

## 2021-09-24 DIAGNOSIS — M4322 Fusion of spine, cervical region: Secondary | ICD-10-CM | POA: Diagnosis not present

## 2021-09-24 DIAGNOSIS — Z981 Arthrodesis status: Secondary | ICD-10-CM | POA: Diagnosis not present

## 2021-09-24 DIAGNOSIS — S0003XA Contusion of scalp, initial encounter: Secondary | ICD-10-CM | POA: Diagnosis not present

## 2021-09-24 DIAGNOSIS — Z043 Encounter for examination and observation following other accident: Secondary | ICD-10-CM | POA: Diagnosis not present

## 2021-09-24 DIAGNOSIS — Z20822 Contact with and (suspected) exposure to covid-19: Secondary | ICD-10-CM | POA: Diagnosis not present

## 2021-09-24 DIAGNOSIS — K317 Polyp of stomach and duodenum: Secondary | ICD-10-CM | POA: Diagnosis not present

## 2021-09-24 DIAGNOSIS — D649 Anemia, unspecified: Secondary | ICD-10-CM | POA: Diagnosis not present

## 2021-09-24 DIAGNOSIS — R296 Repeated falls: Secondary | ICD-10-CM | POA: Diagnosis not present

## 2021-09-24 DIAGNOSIS — K922 Gastrointestinal hemorrhage, unspecified: Secondary | ICD-10-CM | POA: Diagnosis not present

## 2021-09-25 DIAGNOSIS — R531 Weakness: Secondary | ICD-10-CM | POA: Diagnosis not present

## 2021-09-25 DIAGNOSIS — R296 Repeated falls: Secondary | ICD-10-CM | POA: Diagnosis not present

## 2021-09-25 DIAGNOSIS — Z743 Need for continuous supervision: Secondary | ICD-10-CM | POA: Diagnosis not present

## 2021-09-25 DIAGNOSIS — K922 Gastrointestinal hemorrhage, unspecified: Secondary | ICD-10-CM | POA: Diagnosis not present

## 2021-09-25 DIAGNOSIS — F0390 Unspecified dementia without behavioral disturbance: Secondary | ICD-10-CM | POA: Diagnosis not present

## 2021-09-30 DIAGNOSIS — F0390 Unspecified dementia without behavioral disturbance: Secondary | ICD-10-CM | POA: Diagnosis not present

## 2021-09-30 DIAGNOSIS — L0390 Cellulitis, unspecified: Secondary | ICD-10-CM | POA: Diagnosis not present

## 2021-09-30 DIAGNOSIS — K922 Gastrointestinal hemorrhage, unspecified: Secondary | ICD-10-CM | POA: Diagnosis not present

## 2021-10-11 DIAGNOSIS — F0390 Unspecified dementia without behavioral disturbance: Secondary | ICD-10-CM | POA: Diagnosis not present

## 2021-10-11 DIAGNOSIS — E785 Hyperlipidemia, unspecified: Secondary | ICD-10-CM | POA: Diagnosis not present

## 2021-10-11 DIAGNOSIS — G300 Alzheimer's disease with early onset: Secondary | ICD-10-CM | POA: Diagnosis not present

## 2021-10-18 ENCOUNTER — Emergency Department (HOSPITAL_COMMUNITY): Payer: Medicare HMO

## 2021-10-18 ENCOUNTER — Emergency Department (HOSPITAL_COMMUNITY)
Admission: EM | Admit: 2021-10-18 | Discharge: 2021-10-18 | Disposition: A | Discharge status: Home / Self Care | Payer: Medicare HMO | Attending: Emergency Medicine | Admitting: Emergency Medicine

## 2021-10-18 DIAGNOSIS — G309 Alzheimer's disease, unspecified: Secondary | ICD-10-CM | POA: Insufficient documentation

## 2021-10-18 DIAGNOSIS — W2209XA Striking against other stationary object, initial encounter: Secondary | ICD-10-CM | POA: Insufficient documentation

## 2021-10-18 DIAGNOSIS — Z87891 Personal history of nicotine dependence: Secondary | ICD-10-CM | POA: Diagnosis not present

## 2021-10-18 DIAGNOSIS — Z743 Need for continuous supervision: Secondary | ICD-10-CM | POA: Diagnosis not present

## 2021-10-18 DIAGNOSIS — S0003XA Contusion of scalp, initial encounter: Secondary | ICD-10-CM | POA: Diagnosis not present

## 2021-10-18 DIAGNOSIS — R4182 Altered mental status, unspecified: Secondary | ICD-10-CM | POA: Diagnosis not present

## 2021-10-18 DIAGNOSIS — S0990XA Unspecified injury of head, initial encounter: Secondary | ICD-10-CM

## 2021-10-18 DIAGNOSIS — S0081XA Abrasion of other part of head, initial encounter: Secondary | ICD-10-CM | POA: Diagnosis not present

## 2021-10-18 DIAGNOSIS — M7989 Other specified soft tissue disorders: Secondary | ICD-10-CM | POA: Insufficient documentation

## 2021-10-18 DIAGNOSIS — F0280 Dementia in other diseases classified elsewhere without behavioral disturbance: Secondary | ICD-10-CM | POA: Diagnosis not present

## 2021-10-18 DIAGNOSIS — R456 Violent behavior: Secondary | ICD-10-CM | POA: Diagnosis not present

## 2021-10-18 DIAGNOSIS — Y92129 Unspecified place in nursing home as the place of occurrence of the external cause: Secondary | ICD-10-CM | POA: Diagnosis not present

## 2021-10-18 DIAGNOSIS — W19XXXA Unspecified fall, initial encounter: Secondary | ICD-10-CM | POA: Diagnosis not present

## 2021-10-18 LAB — CBG MONITORING, ED: Glucose-Capillary: 94 mg/dL (ref 70–99)

## 2021-10-18 NOTE — Discharge Instructions (Signed)
There is no sign of internal bleeding on CAT scan today.  You can use ice as needed for the bruise on your head.  If you start having severe headache, vomiting, change in mental status you should return to the emergency room for reevaluation.

## 2021-10-18 NOTE — ED Provider Notes (Signed)
I independently evaluated patient's head CT which shows no signs of intracranial bleed.  Radiology report shows a large frontal hematoma but no acute intracranial bleeding.  Cervical spine with multilevel degeneration but no acute findings.  Patient is stable for discharge.  Patient lives in a facility and there is no social barriers for his discharge today.  Wife is aware of the plan   Blanchie Dessert, MD 10/18/21 [phone]

## 2021-10-18 NOTE — ED Notes (Signed)
Report given to [place] at [facility]. Patient added to [employer].

## 2021-10-18 NOTE — ED Provider Notes (Signed)
[facility] Emergency Department Provider Note MRN:  [id number]  Arrival date & time: 10/18/21     Chief Complaint   Head Injury   History of Present Illness   Paul Klein is a 66 y.o. year-old male with a history of dementia presenting to the ED with chief complaint of head injury.  Patient struck his head against the wall at the care facility, here for evaluation.  Patient does not remember events.  Denies any pain.  Review of Systems  I was unable to obtain a full/accurate HPI, PMH, or ROS due to the patient's dementia.  Patient's Health History    Past Medical History:  Diagnosis Date   Alzheimer's disease ([place])    Anxiety    Dr. Toy Care   Diverticulosis of colon    sigmoid, per colonoscopy 2014, Dr. Benson Norway   Dyslipidemia    Hemorrhoids    internal and externa per 2014 colonoscopy   Insomnia    uses Neurontin ( Dr. Toy Care)   Normal cardiac stress test 12/2012   performed due to risk factors, abnormal EKG; Dr. Wynonia Lawman   Panic attack    Second hand smoke exposure    wife    Past Surgical History:  Procedure Laterality Date   ANAL FISSURE REPAIR     COLONOSCOPY  02/19/13   diverticulosis of sigmoid colon, external and internal hemorrhoids, Dr. Benson Norway, repeat in 10 years   SKIN BIOPSY     face, back   WISDOM TOOTH EXTRACTION      Family History  Problem Relation Age of Onset   Heart disease Mother        BYPASS HEART SURGERY   Depression Mother    Mental illness Mother    Cancer Mother        skin   Dementia Mother    Hypertension Father    Benign prostatic hyperplasia Father    Cancer Father        skin   Heart disease Father 63       CABG   Depression Sister    Stroke Maternal Grandmother    Diabetes Paternal Grandfather     Social History   Socioeconomic History   Marital status: Married    Spouse name: Not on file   Number of children: 0   Years of education: Not on file   Highest education level: Not on file  Occupational  History   Not on file  Tobacco Use   Smoking status: Former    Packs/day: 2.00    Years: 6.00    Pack years: 12.00    Types: Cigarettes    Quit date: 1970    Years since quitting: 53.0   Smokeless tobacco: Never  Vaping Use   Vaping Use: Never used  Substance and Sexual Activity   Alcohol use: Yes    Comment: occ   Drug use: No    Comment: marijuana in the past   Sexual activity: Not on file  Other Topics Concern   Not on file  Social History Narrative   Walks the dog, does calisthenics.  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 1 grandchild lives in [place], other grandchild teenager, wants to spend time with his friends. 08/2017   Right handed    Social Determinants of Health   Financial Resource Strain: Not on file  Food Insecurity: Not on file  Transportation Needs: Not on file  Physical Activity:  Not on file  Stress: Not on file  Social Connections: Not on file  Intimate Partner Violence: Not on file     Physical Exam   Vitals:   10/18/21 0607 10/18/21 0630  BP:  112/69  Pulse:  (!) 54  Resp:  11  Temp:    SpO2: 99% 99%    CONSTITUTIONAL: Well-appearing, NAD NEURO/PSYCH: Oriented to name only, moves all extremities EYES:  eyes equal and reactive ENT/NECK:  no LAD, no JVD CARDIO: Regular rate, well-perfused, normal S1 and S2 PULM:  CTAB no wheezing or rhonchi GI/GU:  non-distended, non-tender MSK/SPINE:  No gross deformities, pitting edema to bilateral lower extremities SKIN: Abrasions to the forehead   *Additional and/or pertinent findings included in MDM below  Diagnostic and Interventional Summary    EKG Interpretation  Date/Time:    Ventricular Rate:    PR Interval:    QRS Duration:   QT Interval:    QTC Calculation:   R Axis:     Text Interpretation:         Labs Reviewed  CBG MONITORING, ED    CT HEAD WO CONTRAST (5MM)    (Results Pending)  CT CERVICAL SPINE WO CONTRAST     (Results Pending)    Medications - No data to display   Procedures  /  Critical Care Procedures  ED Course and Medical Decision Making  Initial Impression and Ddx CT to exclude intracranial bleeding, long history of dementia, no other symptoms noted.  Vital signs normal.  Past medical/surgical history that increases complexity of ED encounter: Dementia  Interpretation of Diagnostics Awaiting CT imaging  Patient Reassessment and Ultimate Disposition/Management Suspect will be able to go back to facility if CT scans are reassuring.  Patient has some chronic leg swelling and redness that I discussed with patient's wife, has been present for several weeks, doubt infection based on my exam.  Advised compression socks, PCP follow-up.  Patient management required discussion with the following services or consulting groups:  None  Complexity of Problems Addressed Acute complicated illness or Injury  Additional Data Reviewed and Analyzed Further history obtained from: EMS on arrival  Factors Impacting ED Encounter Risk None  Barth Kirks. Sedonia Small, Correll [email or web address]  Final Clinical Impressions(s) / ED Diagnoses     ICD-10-CM   1. Injury of head, initial encounter  S09.90XA       ED Discharge Orders     None        Discharge Instructions Discussed with and Provided to Patient:   Discharge Instructions   None      Maudie Flakes, MD 10/18/21 [phone]

## 2021-10-18 NOTE — ED Triage Notes (Signed)
Patient arrived via EMS from [facility]. EMS reports patient was wandering around in nursing home and patient hit his against the wall on purpose. NO LOC. Hx of dementia.

## 2021-10-24 DIAGNOSIS — E559 Vitamin D deficiency, unspecified: Secondary | ICD-10-CM | POA: Diagnosis not present

## 2021-10-24 DIAGNOSIS — I10 Essential (primary) hypertension: Secondary | ICD-10-CM | POA: Diagnosis not present

## 2021-10-24 DIAGNOSIS — E119 Type 2 diabetes mellitus without complications: Secondary | ICD-10-CM | POA: Diagnosis not present

## 2021-10-24 DIAGNOSIS — E038 Other specified hypothyroidism: Secondary | ICD-10-CM | POA: Diagnosis not present

## 2021-10-24 DIAGNOSIS — N400 Enlarged prostate without lower urinary tract symptoms: Secondary | ICD-10-CM | POA: Diagnosis not present

## 2021-10-24 DIAGNOSIS — D518 Other vitamin B12 deficiency anemias: Secondary | ICD-10-CM | POA: Diagnosis not present

## 2021-10-24 DIAGNOSIS — E785 Hyperlipidemia, unspecified: Secondary | ICD-10-CM | POA: Diagnosis not present

## 2021-10-27 DIAGNOSIS — D649 Anemia, unspecified: Secondary | ICD-10-CM | POA: Diagnosis not present

## 2021-10-27 DIAGNOSIS — E119 Type 2 diabetes mellitus without complications: Secondary | ICD-10-CM | POA: Diagnosis not present

## 2021-11-08 DIAGNOSIS — E1129 Type 2 diabetes mellitus with other diabetic kidney complication: Secondary | ICD-10-CM | POA: Diagnosis not present

## 2021-11-08 DIAGNOSIS — G300 Alzheimer's disease with early onset: Secondary | ICD-10-CM | POA: Diagnosis not present

## 2021-11-08 DIAGNOSIS — E785 Hyperlipidemia, unspecified: Secondary | ICD-10-CM | POA: Diagnosis not present

## 2021-11-15 DIAGNOSIS — N3942 Incontinence without sensory awareness: Secondary | ICD-10-CM | POA: Diagnosis not present

## 2021-11-15 DIAGNOSIS — G300 Alzheimer's disease with early onset: Secondary | ICD-10-CM | POA: Diagnosis not present

## 2021-11-27 DIAGNOSIS — N390 Urinary tract infection, site not specified: Secondary | ICD-10-CM | POA: Diagnosis not present

## 2021-12-13 DIAGNOSIS — F0390 Unspecified dementia without behavioral disturbance: Secondary | ICD-10-CM | POA: Diagnosis not present

## 2021-12-13 DIAGNOSIS — I5023 Acute on chronic systolic (congestive) heart failure: Secondary | ICD-10-CM | POA: Diagnosis not present

## 2021-12-13 DIAGNOSIS — E785 Hyperlipidemia, unspecified: Secondary | ICD-10-CM | POA: Diagnosis not present

## 2021-12-13 DIAGNOSIS — I4891 Unspecified atrial fibrillation: Secondary | ICD-10-CM | POA: Diagnosis not present

## 2021-12-13 DIAGNOSIS — Z0000 Encounter for general adult medical examination without abnormal findings: Secondary | ICD-10-CM | POA: Diagnosis not present

## 2021-12-13 DIAGNOSIS — C679 Malignant neoplasm of bladder, unspecified: Secondary | ICD-10-CM | POA: Diagnosis not present

## 2021-12-13 DIAGNOSIS — E1129 Type 2 diabetes mellitus with other diabetic kidney complication: Secondary | ICD-10-CM | POA: Diagnosis not present

## 2021-12-13 DIAGNOSIS — G300 Alzheimer's disease with early onset: Secondary | ICD-10-CM | POA: Diagnosis not present

## 2021-12-16 ENCOUNTER — Ambulatory Visit: Payer: Medicare HMO | Admitting: Physician Assistant

## 2022-02-25 ENCOUNTER — Ambulatory Visit: Payer: Medicare HMO | Admitting: Neurology

## 2022-05-10 IMAGING — CT CT HEAD W/O CM
2 of 4 series · 11 of 47 positions shown, 13 images · non-contrast
Comparison: September 24, 2021.

CLINICAL DATA: Head injury.



[Series 7: coronal soft tissue · coronal · 0.35mm/px · 3 of 82 slices shown]
[im 28/82  brain]
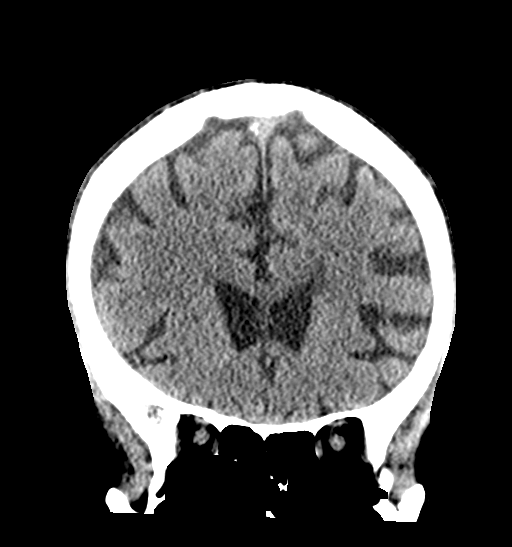
[im 37/82  brain]
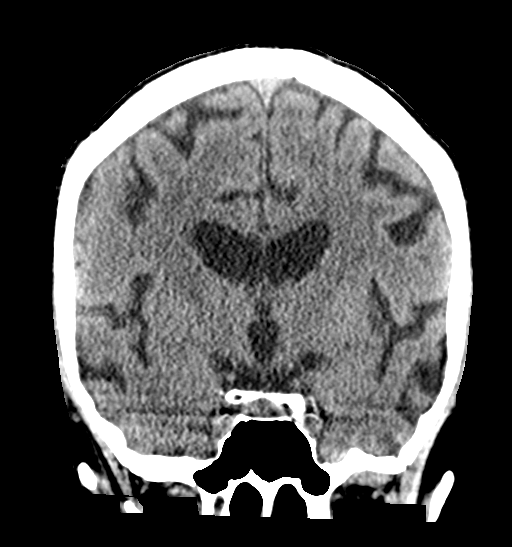
[im 46/82  brain]
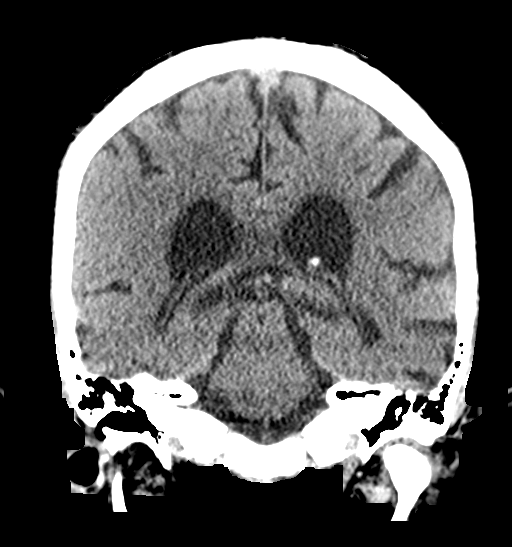

[Series 9: true axial · axial · 0.35mm/px · z∈[-482,-338]mm · 8 of 62 slices shown, 10 images]
[im 7/62  brain]
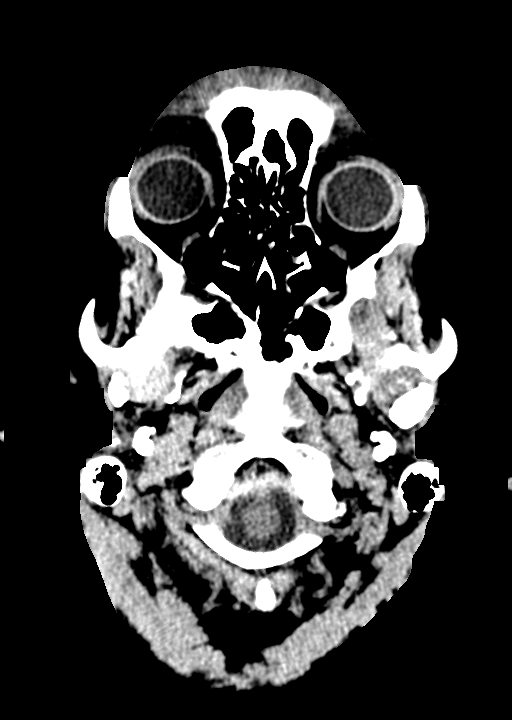
[im 7/62  bone]
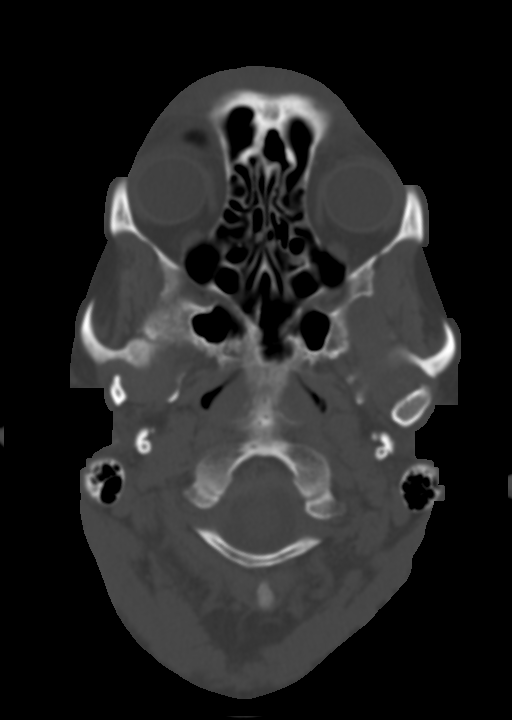
[im 14/62  brain]
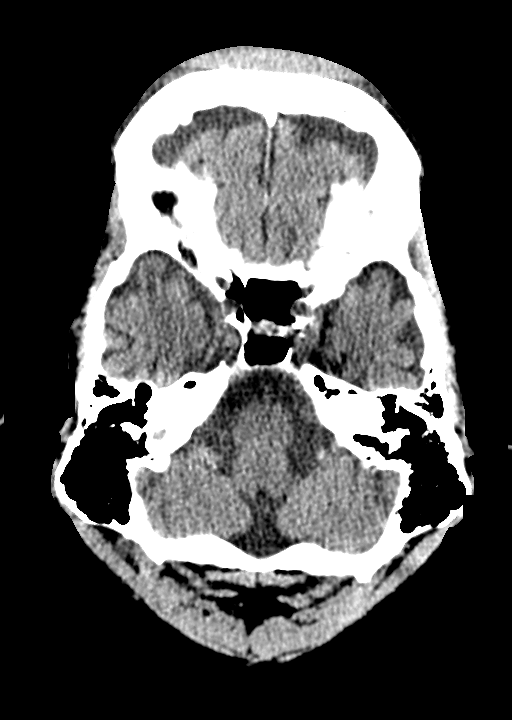
[im 21/62  brain]
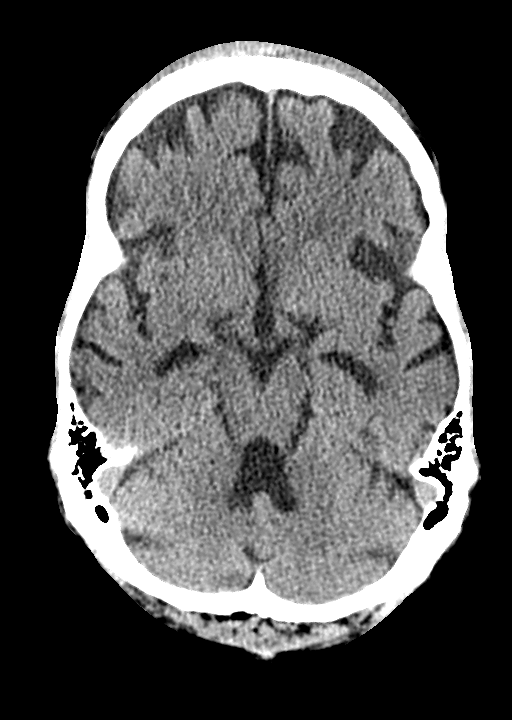
[im 28/62  brain]
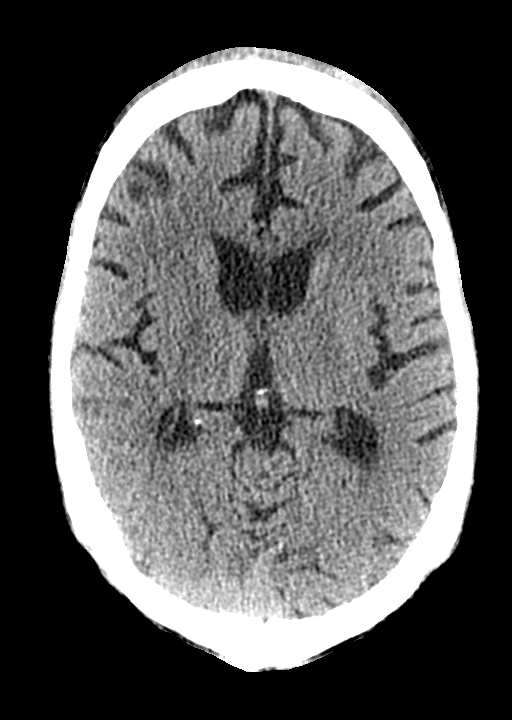
[im 34/62  brain]
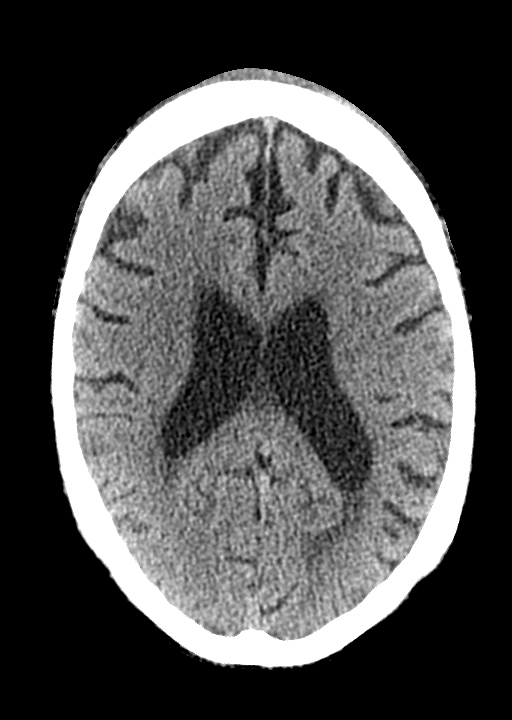
[im 34/62  bone]
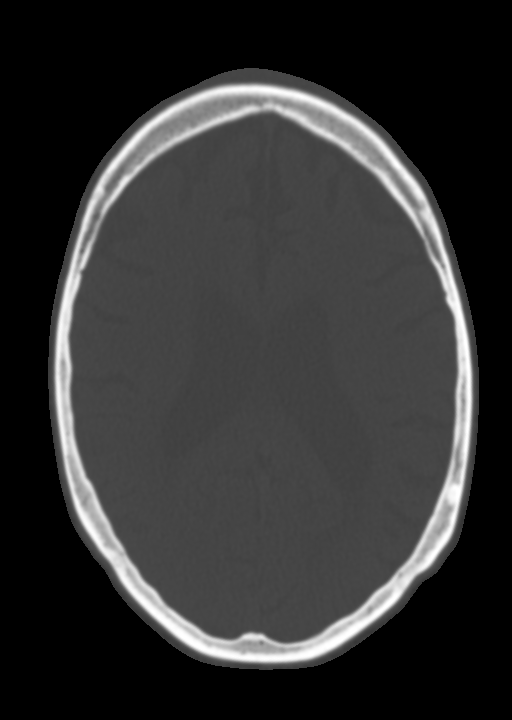
[im 41/62  brain]
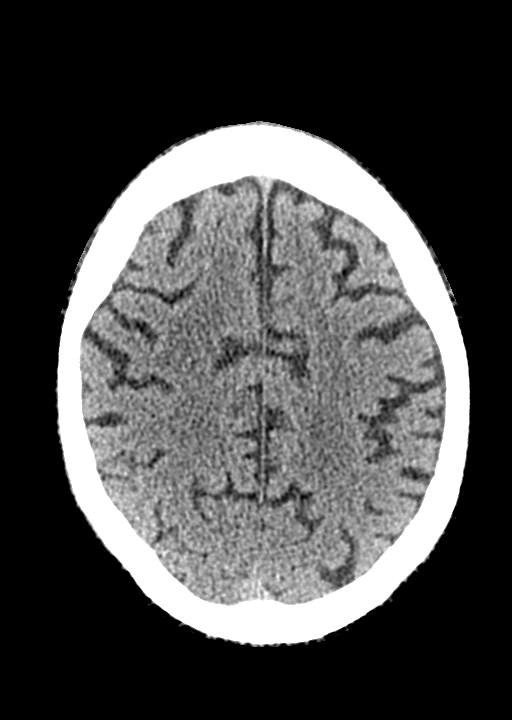
[im 48/62  brain]
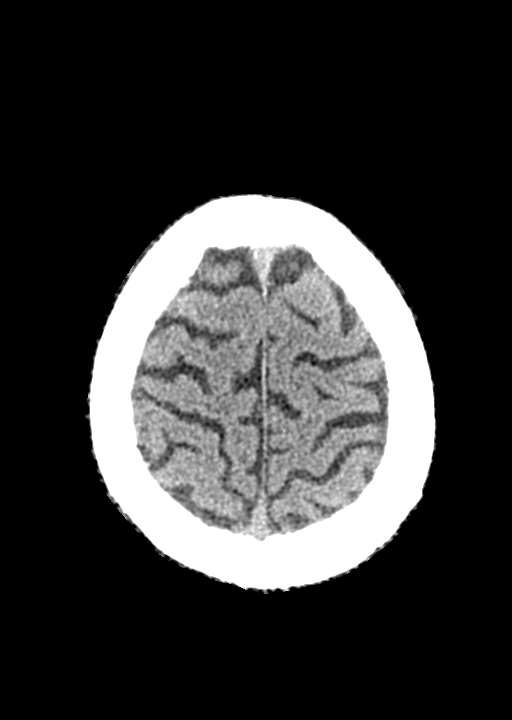
[im 55/62  brain]
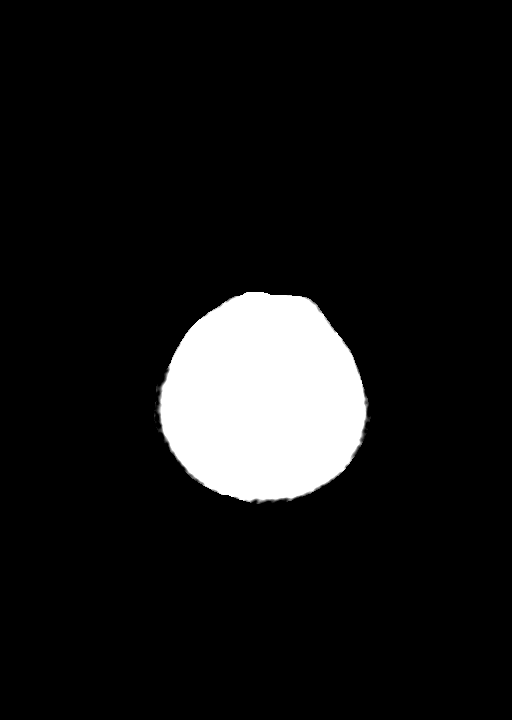

[11 of 47 positions shown; findings below may reference images not displayed]

FINDINGS: CT HEAD FINDINGS

Brain: No evidence of acute infarction, hemorrhage, hydrocephalus,
extra-axial collection or mass lesion/mass effect.

Vascular: No hyperdense vessel or unexpected calcification.

Skull: Normal. Negative for fracture or focal lesion.

Sinuses/Orbits: No acute finding.

Other: Large frontal scalp hematoma is noted.

CT CERVICAL SPINE FINDINGS

Alignment: Normal.

Skull base and vertebrae: No acute fracture. No primary bone lesion
or focal pathologic process.

Soft tissues and spinal canal: No prevertebral fluid or swelling. No
visible canal hematoma.

Disc levels: Moderate degenerative disc disease is noted at C3-4.
Severe degenerative disc disease is noted at C5-6, C6-7 and C7-T1.

Upper chest: Negative.

Other: None.
IMPRESSION: Large frontal scalp hematoma. No acute intracranial abnormality
seen.

Multilevel degenerative disc disease is noted in the cervical spine.
No acute abnormality is noted.

## 2022-12-13 ENCOUNTER — Emergency Department (HOSPITAL_COMMUNITY): Payer: Medicare HMO

## 2022-12-13 ENCOUNTER — Emergency Department (HOSPITAL_COMMUNITY)
Admission: EM | Admit: 2022-12-13 | Discharge: 2022-12-13 | Disposition: A | Discharge status: Skilled Nursing Facility | Payer: Medicare HMO | Attending: Emergency Medicine | Admitting: Emergency Medicine

## 2022-12-13 ENCOUNTER — Encounter (HOSPITAL_COMMUNITY): Payer: Self-pay

## 2022-12-13 DIAGNOSIS — W01198A Fall on same level from slipping, tripping and stumbling with subsequent striking against other object, initial encounter: Secondary | ICD-10-CM | POA: Insufficient documentation

## 2022-12-13 DIAGNOSIS — Z7982 Long term (current) use of aspirin: Secondary | ICD-10-CM | POA: Diagnosis not present

## 2022-12-13 DIAGNOSIS — Z23 Encounter for immunization: Secondary | ICD-10-CM | POA: Insufficient documentation

## 2022-12-13 DIAGNOSIS — S0990XA Unspecified injury of head, initial encounter: Secondary | ICD-10-CM | POA: Diagnosis not present

## 2022-12-13 DIAGNOSIS — Y92002 Bathroom of unspecified non-institutional (private) residence single-family (private) house as the place of occurrence of the external cause: Secondary | ICD-10-CM | POA: Diagnosis not present

## 2022-12-13 DIAGNOSIS — W19XXXA Unspecified fall, initial encounter: Secondary | ICD-10-CM

## 2022-12-13 DIAGNOSIS — S01112A Laceration without foreign body of left eyelid and periocular area, initial encounter: Secondary | ICD-10-CM | POA: Diagnosis not present

## 2022-12-13 DIAGNOSIS — S0181XA Laceration without foreign body of other part of head, initial encounter: Secondary | ICD-10-CM

## 2022-12-13 MED ORDER — LIDOCAINE-EPINEPHRINE-TETRACAINE (LET) TOPICAL GEL
3.0000 mL | Freq: Once | TOPICAL | Status: AC
  Administered 2022-12-13: 3 mL via TOPICAL
  Filled 2022-12-13: qty 3

## 2022-12-13 MED ORDER — TETANUS-DIPHTH-ACELL PERTUSSIS 5-2.5-18.5 LF-MCG/0.5 IM SUSY
0.5000 mL | PREFILLED_SYRINGE | Freq: Once | INTRAMUSCULAR | Status: AC
Start: 2022-12-13 — End: 2022-12-13
  Administered 2022-12-13: 0.5 mL via INTRAMUSCULAR
  Filled 2022-12-13: qty 0.5

## 2022-12-13 MED ORDER — LIDOCAINE-EPINEPHRINE (PF) 2 %-1:200000 IJ SOLN
10.0000 mL | Freq: Once | INTRAMUSCULAR | Status: AC
  Administered 2022-12-13: 10 mL
  Filled 2022-12-13: qty 20

## 2022-12-13 NOTE — Discharge Instructions (Signed)
1.  Stitches should be removed in 7 to 10 days. 2.  CT scanning of the head and the neck did not show any evidence of traumatic injury.  Please follow the head injury instructions and return if there are any changes from baseline. 3.  At this time on physical exam, I do not find evidence of other injury or fracture.  If areas of pain or injury are identified that need further evaluation, please follow-up with your doctor or return to the emergency department for further evaluation.

## 2022-12-13 NOTE — ED Provider Notes (Signed)
[place] [facility]MENT AT [facility] Provider Note   CSN: [id number] Arrival date & time: 12/13/22  [id number]     History  Chief Complaint  Patient presents with   Lytle Michaels    Paul Klein is a 67 y.o. male.  HPI Reportedly patient had a fall this morning while trying to go to the bathroom.  He was standing at the toilet and fell forward striking his forehead.  He had a laceration to the forehead.  Patient has Alzheimer's disease and does not provide any additional history.  He does not recall having fallen and does not make any other complaints for areas of pain.    Home Medications Prior to Admission medications   Medication Sig Start Date End Date Taking? Authorizing Provider  ARIPiprazole (ABILIFY) 5 MG tablet Take 10 mg by mouth 2 (two) times daily. 06/27/21   [provider]  ASPIRIN LOW DOSE 81 MG EC tablet TAKE 1 TABLET(81 MG) BY MOUTH DAILY Patient taking differently: Take 81 mg by mouth at bedtime. 06/28/21   Tysinger, Camelia Eng, PA-C  clonazePAM (KLONOPIN) 0.5 MG tablet Take 1 tablet (0.5 mg total) by mouth daily as needed for up to 20 days for anxiety. Patient taking differently: Take 0.5 mg by mouth in the morning, at noon, and at bedtime. 06/15/21 07/16/21  Tysinger, Camelia Eng, PA-C  doxazosin (CARDURA) 2 MG tablet Take 1 tablet (2 mg total) by mouth daily. Patient taking differently: Take 2 mg by mouth at bedtime. 02/24/21 02/24/22  Tysinger, Camelia Eng, PA-C  escitalopram (LEXAPRO) 10 MG tablet Take 1 tablet (10 mg total) by mouth daily. Patient taking differently: Take 10 mg by mouth at bedtime. 02/23/21   Cameron Sprang, MD  gabapentin (NEURONTIN) 800 MG tablet Take 1.5 tablets (1,200 mg total) by mouth at bedtime. 04/21/21   Cameron Sprang, MD  haloperidol (HALDOL) 2 MG tablet Take 2 mg by mouth daily. 06/27/21   [provider]  hydrocortisone 2.5 % cream Apply topically 2 (two) times daily. Patient not taking: No sig reported 06/02/21   Tysinger,  Camelia Eng, PA-C  melatonin 3 MG TABS tablet Take 1 tablet (3 mg total) by mouth at bedtime. Patient not taking: No sig reported 09/11/20   Cameron Sprang, MD  Multiple Vitamin (MULTIVITAMIN) tablet Take 1 tablet by mouth at bedtime.    [provider]  Omega-3 Fatty Acids (FISH OIL) 1000 MG CAPS Take 2,000 mg by mouth at bedtime.    [provider]  QUEtiapine (SEROQUEL) 25 MG tablet Take 1 tablet (25 mg total) by mouth 2 (two) times daily. Patient not taking: No sig reported 06/02/21   Tysinger, Camelia Eng, PA-C  rosuvastatin (CRESTOR) 20 MG tablet Take 1 tablet (20 mg total) by mouth at bedtime. 07/19/21   Tysinger, Camelia Eng, PA-C      Allergies    Prozac [fluoxetine hcl]    Review of Systems   Review of Systems  Physical Exam Updated Vital Signs BP 112/68 (BP Location: Left Arm)   Pulse 84   Temp 98.7 F (37.1 C) (Rectal)   Resp 17   Ht 6' (1.829 m)   Wt 78.9 kg   SpO2 98%   BMI 23.60 kg/m  Physical Exam Constitutional:      Comments: Patient resting calmly and quietly no acute distress.  No respiratory distress.  Rouses to light touch and voice.  HENT:     Head:     Comments: Full-thickness  laceration to the left brow.  No active bleeding.    Mouth/Throat:     Pharynx: Oropharynx is clear.  Eyes:     Extraocular Movements: Extraocular movements intact.  Cardiovascular:     Rate and Rhythm: Normal rate and regular rhythm.  Pulmonary:     Effort: Pulmonary effort is normal.     Breath sounds: Normal breath sounds.     Comments: Chest: Nontender to compression. Chest:     Chest wall: No tenderness.  Abdominal:     General: There is no distension.     Palpations: Abdomen is soft.     Tenderness: There is no abdominal tenderness. There is no guarding.  Musculoskeletal:     Comments: 2-3+ peripheral edema symmetric bilateral lower extremities.  No apparent traumatic injuries.  I can flex and extend at both hips and knees without any apparent pain.  No  deformities of upper extremities.  Can flex and extend both upper extremities at shoulder elbow wrist without appearance of pain.  Skin:    General: Skin is warm and dry.  Neurological:     Comments: Patient has very limited cognitive interaction.  He rests quietly.  I can awaken him and he will politely answer questions with simple response but has no recall for any events.  He spontaneously can move all 4 extremities.  Psychiatric:     Comments: Calm     ED Results / Procedures / Treatments   Labs (all labs ordered are listed, but only abnormal results are displayed) Labs Reviewed - No data to display  EKG None  Radiology CT Cervical Spine Wo Contrast  Result Date: 12/13/2022 CLINICAL DATA:  67 year old male status post fall while urinating. Struck head. Pain. EXAM: CT CERVICAL SPINE WITHOUT CONTRAST TECHNIQUE: Multidetector CT imaging of the cervical spine was performed without intravenous contrast. Multiplanar CT image reconstructions were also generated. RADIATION DOSE REDUCTION: This exam was performed according to the departmental dose-optimization program which includes automated exposure control, adjustment of the mA and/or kV according to patient size and/or use of iterative reconstruction technique. COMPARISON:  Head CT today.  Cervical spine CT 10/18/2021. FINDINGS: Alignment: Improved cervical lordosis. Cervicothoracic junction alignment is within normal limits. Bilateral posterior element alignment is within normal limits. Skull base and vertebrae: Bone mineralization is within normal limits for age. Visualized skull base is intact. No atlanto-occipital dissociation. C1 and C2 appear intact and aligned. Chronic C5-C6 ankylosis. Degenerative left side C3-C4 facet ankylosis is new/progressed. No acute osseous abnormality identified. Soft tissues and spinal canal: No prevertebral fluid or swelling. No visible canal hematoma. Negative for age visible noncontrast neck soft tissues. Disc  levels: Chronic C5-C6 ankylosis. Chronic adjacent segment C6-C7 disc and endplate degeneration with chronic vacuum disc. New/increased left side C3-C4 facet ankylosis. The intervening C4-C5 level appears stable. Mild if any cervical spinal stenosis. Upper chest: Partially visible chronic upper thoracic spine ankylosis including T2-T3, T3-T4 on the right. No acute osseous abnormality identified. Globular retained secretions in the trachea at the thoracic inlet series 9, image 92. Below that the visible trachea is patent. Layering pleural effusions are new from last year, small to moderate and perhaps greater on the left. Associated compressive atelectasis but otherwise negative lung apices. Calcified aortic atherosclerosis. IMPRESSION: 1. No acute traumatic injury identified in the cervical spine. 2. Layering pleural effusions are new from last year, small to moderate and greater on the left. Retained or aspirated secretions in the trachea. 3. Chronic C5-C6 and upper thoracic spine ankylosis.  New/progressed left C3-C4 facet ankylosis since last year. Other cervical spine levels appears stable. Electronically Signed   By: Genevie Ann M.D.   On: 12/13/2022 07:33   CT Head Wo Contrast  Result Date: 12/13/2022 CLINICAL DATA:  67 year old male status post fall while urinating. Struck head. Pain. EXAM: CT HEAD WITHOUT CONTRAST TECHNIQUE: Contiguous axial images were obtained from the base of the skull through the vertex without intravenous contrast. RADIATION DOSE REDUCTION: This exam was performed according to the departmental dose-optimization program which includes automated exposure control, adjustment of the mA and/or kV according to patient size and/or use of iterative reconstruction technique. COMPARISON:  Brain MRI 03/28/2020. Head CT 10/18/2021. Cervical spine CT today reported separately. FINDINGS: Brain: Cerebral volume does not appear significantly changed from last year. No midline shift, ventriculomegaly,  mass effect, evidence of mass lesion, intracranial hemorrhage or evidence of cortically based acute infarction. Patchy periventricular white matter hypodensity is stable from the previous MRI. Otherwise maintained gray-white differentiation. Vascular: Mild Calcified atherosclerosis at the skull base. No suspicious intracranial vascular hyperdensity. Skull: No acute fracture identified. Sinuses/Orbits: Visualized paranasal sinuses and mastoids are stable and well aerated. Other: Small left forehead skin defect/laceration series 4, image 46. But the broad-based anterior scalp hematoma seen last year has resolved. No tracking scalp soft tissue gas. Orbits soft tissues appear to remain negative. IMPRESSION: 1. Small left forehead skin laceration. No underlying skull fracture. 2. No acute intracranial abnormality. Chronic cerebral volume loss and mild for age white matter changes. Electronically Signed   By: Genevie Ann M.D.   On: 12/13/2022 07:28    Procedures .Marland KitchenLaceration Repair  Date/Time: 12/13/2022 8:02 AM  Performed by: Charlesetta Shanks, MD Authorized by: Charlesetta Shanks, MD   Anesthesia:    Anesthesia method:  Local infiltration   Local anesthetic:  Lidocaine 2% WITH epi Laceration details:    Location:  Face   Face location:  L eyebrow   Length (cm):  2   Depth (mm):  8 Pre-procedure details:    Preparation:  Patient was prepped and draped in usual sterile fashion Exploration:    Wound extent: areolar tissue violated and muscle damage     Contaminated: no   Treatment:    Area cleansed with:  Saline   Amount of cleaning:  Standard Skin repair:    Repair method:  Sutures   Suture size:  5-0   Suture material:  Nylon   Suture technique:  Simple interrupted   Number of sutures:  3 Approximation:    Approximation:  Close Repair type:    Repair type:  Simple Post-procedure details:    Procedure completion:  Tolerated well, no immediate complications     Medications Ordered in  ED Medications  Tdap (BOOSTRIX) injection 0.5 mL (0.5 mLs Intramuscular Given 12/13/22 0753)  lidocaine-EPINEPHrine (XYLOCAINE W/EPI) 2 %-1:200000 (PF) injection 10 mL (10 mLs Infiltration Given 12/13/22 0639)  lidocaine-EPINEPHrine-tetracaine (LET) topical gel (3 mLs Topical Given 12/13/22 [id number])    ED Course/ Medical Decision Making/ A&P                             Medical Decision Making  Patient presents for a fall.  He has laceration to the forehead.  Patient has Alzheimer's disease and poor recall for the event.  On physical exam no apparent other musculoskeletal injury.  CT head and CT C-spine interpreted by radiology negative for any acute injury patterns.  Patient is laceration cleaned  and repaired.  At this time with no additional injuries identified and CTs without intracranial injury or cervical spine injury will plan for discharge back to facility.  I have called contact number on patient's demographics for his wife.  I have left a message regarding updating for ED visit.  At this time I have not had call back.  Discharge instructions include return precautions.         Final Clinical Impression(s) / ED Diagnoses Final diagnoses:  Fall, initial encounter  Injury of head, initial encounter  Facial laceration, initial encounter    Rx / DC Orders ED Discharge Orders     None         Charlesetta Shanks, MD 12/13/22 [phone]

## 2022-12-13 NOTE — ED Notes (Signed)
[facility] notified for transport back to [facility] notified pt is returning to facility

## 2022-12-13 NOTE — ED Notes (Signed)
Writer notified pt's spouse of discharge along with going over discharge instructions with pt's wife.

## 2022-12-13 NOTE — ED Triage Notes (Signed)
Pt presents to ED for evaluation of fall while attempting to urinate. Pt slipped in urine on the floor and hit his head. Noted laceration to left brow. Pt reported neck pain to EMS but denies any pain on arrival.

## 2022-12-25 ENCOUNTER — Emergency Department (HOSPITAL_COMMUNITY): Payer: Medicare HMO

## 2022-12-25 ENCOUNTER — Observation Stay (HOSPITAL_COMMUNITY)
Admission: EM | Admit: 2022-12-25 | Discharge: 2022-12-26 | Disposition: A | Discharge status: Home / Self Care | Payer: Medicare HMO | Attending: Internal Medicine | Admitting: Internal Medicine

## 2022-12-25 DIAGNOSIS — G309 Alzheimer's disease, unspecified: Secondary | ICD-10-CM

## 2022-12-25 DIAGNOSIS — B9620 Unspecified Escherichia coli [E. coli] as the cause of diseases classified elsewhere: Secondary | ICD-10-CM

## 2022-12-25 DIAGNOSIS — Z87891 Personal history of nicotine dependence: Secondary | ICD-10-CM | POA: Diagnosis not present

## 2022-12-25 DIAGNOSIS — J811 Chronic pulmonary edema: Secondary | ICD-10-CM | POA: Diagnosis not present

## 2022-12-25 DIAGNOSIS — R531 Weakness: Secondary | ICD-10-CM | POA: Diagnosis present

## 2022-12-25 DIAGNOSIS — N3000 Acute cystitis without hematuria: Secondary | ICD-10-CM

## 2022-12-25 DIAGNOSIS — G9340 Encephalopathy, unspecified: Principal | ICD-10-CM | POA: Diagnosis present

## 2022-12-25 DIAGNOSIS — Z7982 Long term (current) use of aspirin: Secondary | ICD-10-CM | POA: Insufficient documentation

## 2022-12-25 DIAGNOSIS — Z79899 Other long term (current) drug therapy: Secondary | ICD-10-CM | POA: Insufficient documentation

## 2022-12-25 DIAGNOSIS — Z1152 Encounter for screening for COVID-19: Secondary | ICD-10-CM | POA: Insufficient documentation

## 2022-12-25 DIAGNOSIS — M6281 Muscle weakness (generalized): Secondary | ICD-10-CM | POA: Insufficient documentation

## 2022-12-25 DIAGNOSIS — Z9181 History of falling: Secondary | ICD-10-CM | POA: Diagnosis not present

## 2022-12-25 DIAGNOSIS — N390 Urinary tract infection, site not specified: Secondary | ICD-10-CM | POA: Insufficient documentation

## 2022-12-25 DIAGNOSIS — R2689 Other abnormalities of gait and mobility: Secondary | ICD-10-CM | POA: Insufficient documentation

## 2022-12-25 DIAGNOSIS — F0280 Dementia in other diseases classified elsewhere without behavioral disturbance: Secondary | ICD-10-CM

## 2022-12-25 DIAGNOSIS — D638 Anemia in other chronic diseases classified elsewhere: Secondary | ICD-10-CM | POA: Diagnosis not present

## 2022-12-25 LAB — I-STAT VENOUS BLOOD GAS, ED
Acid-Base Excess: 3 mmol/L — ABNORMAL HIGH (ref 0.0–2.0)
Bicarbonate: 25.6 mmol/L (ref 20.0–28.0)
Calcium, Ion: 1.1 mmol/L — ABNORMAL LOW (ref 1.15–1.40)
HCT: 39 % (ref 39.0–52.0)
Hemoglobin: 13.3 g/dL (ref 13.0–17.0)
O2 Saturation: 99 %
Potassium: 4 mmol/L (ref 3.5–5.1)
Sodium: 136 mmol/L (ref 135–145)
TCO2: 27 mmol/L (ref 22–32)
pCO2, Ven: 32.4 mmHg — ABNORMAL LOW (ref 44–60)
pH, Ven: 7.506 — ABNORMAL HIGH (ref 7.25–7.43)
pO2, Ven: 115 mmHg — ABNORMAL HIGH (ref 32–45)

## 2022-12-25 LAB — CBC WITH DIFFERENTIAL/PLATELET
Abs Immature Granulocytes: 0.05 10*3/uL (ref 0.00–0.07)
Basophils Absolute: 0.1 10*3/uL (ref 0.0–0.1)
Basophils Relative: 0 %
Eosinophils Absolute: 0.1 10*3/uL (ref 0.0–0.5)
Eosinophils Relative: 1 %
HCT: 38.5 % — ABNORMAL LOW (ref 39.0–52.0)
Hemoglobin: 12.3 g/dL — ABNORMAL LOW (ref 13.0–17.0)
Immature Granulocytes: 0 %
Lymphocytes Relative: 7 %
Lymphs Abs: 1 10*3/uL (ref 0.7–4.0)
MCH: 27.3 pg (ref 26.0–34.0)
MCHC: 31.9 g/dL (ref 30.0–36.0)
MCV: 85.6 fL (ref 80.0–100.0)
Monocytes Absolute: 0.9 10*3/uL (ref 0.1–1.0)
Monocytes Relative: 6 %
Neutro Abs: 12.9 10*3/uL — ABNORMAL HIGH (ref 1.7–7.7)
Neutrophils Relative %: 86 %
Platelets: 368 10*3/uL (ref 150–400)
RBC: 4.5 MIL/uL (ref 4.22–5.81)
RDW: 13.6 % (ref 11.5–15.5)
WBC: 14.9 10*3/uL — ABNORMAL HIGH (ref 4.0–10.5)
nRBC: 0 % (ref 0.0–0.2)

## 2022-12-25 LAB — URINALYSIS, W/ REFLEX TO CULTURE (INFECTION SUSPECTED)
Bilirubin Urine: NEGATIVE
Glucose, UA: NEGATIVE mg/dL
Ketones, ur: NEGATIVE mg/dL
Nitrite: NEGATIVE
Protein, ur: NEGATIVE mg/dL
Specific Gravity, Urine: 1.006 (ref 1.005–1.030)
pH: 7 (ref 5.0–8.0)

## 2022-12-25 LAB — COMPREHENSIVE METABOLIC PANEL
ALT: 17 U/L (ref 0–44)
AST: 16 U/L (ref 15–41)
Albumin: 2.9 g/dL — ABNORMAL LOW (ref 3.5–5.0)
Alkaline Phosphatase: 80 U/L (ref 38–126)
Anion gap: 14 (ref 5–15)
BUN: 9 mg/dL (ref 8–23)
CO2: 23 mmol/L (ref 22–32)
Calcium: 8.9 mg/dL (ref 8.9–10.3)
Chloride: 99 mmol/L (ref 98–111)
Creatinine, Ser: 0.7 mg/dL (ref 0.61–1.24)
GFR, Estimated: >60 mL/min (ref >60)
Glucose, Bld: 140 mg/dL — ABNORMAL HIGH (ref 70–99)
Potassium: 4.1 mmol/L (ref 3.5–5.1)
Sodium: 136 mmol/L (ref 135–145)
Total Bilirubin: 0.7 mg/dL (ref 0.3–1.2)
Total Protein: 6.5 g/dL (ref 6.5–8.1)

## 2022-12-25 LAB — TROPONIN I (HIGH SENSITIVITY)
Troponin I (High Sensitivity): 5 ng/L (ref <18)
Troponin I (High Sensitivity): 4 ng/L (ref <18)

## 2022-12-25 LAB — MAGNESIUM: Magnesium: 2.1 mg/dL (ref 1.7–2.4)

## 2022-12-25 LAB — BRAIN NATRIURETIC PEPTIDE: B Natriuretic Peptide: 208.2 pg/mL — ABNORMAL HIGH (ref 0.0–100.0)

## 2022-12-25 LAB — TSH: TSH: 1.48 u[IU]/mL (ref 0.350–4.500)

## 2022-12-25 LAB — PHOSPHORUS: Phosphorus: 3.7 mg/dL (ref 2.5–4.6)

## 2022-12-25 LAB — SARS CORONAVIRUS 2 BY RT PCR: SARS Coronavirus 2 by RT PCR: NEGATIVE

## 2022-12-25 MED ORDER — ENOXAPARIN SODIUM 40 MG/0.4ML IJ SOSY
40.0000 mg | PREFILLED_SYRINGE | INTRAMUSCULAR | Status: DC
  Administered 2022-12-25 – 2022-12-29 (×5): 40 mg via SUBCUTANEOUS
  Filled 2022-12-25 (×5): qty 0.4

## 2022-12-25 MED ORDER — SODIUM CHLORIDE 0.9 % IV SOLN
1.0000 g | Freq: Once | INTRAVENOUS | Status: AC
  Administered 2022-12-25: 1 g via INTRAVENOUS
  Filled 2022-12-25: qty 10

## 2022-12-25 MED ORDER — ONDANSETRON HCL 4 MG/2ML IJ SOLN: 4.0000 mg | Freq: Four times a day (QID) | INTRAMUSCULAR | Status: DC | PRN

## 2022-12-25 MED ORDER — ACETAMINOPHEN 325 MG PO TABS: 650.0000 mg | ORAL_TABLET | Freq: Four times a day (QID) | ORAL | Status: DC | PRN

## 2022-12-25 MED ORDER — ASPIRIN 81 MG PO TBEC
81.0000 mg | DELAYED_RELEASE_TABLET | Freq: Every day | ORAL | Status: DC
  Administered 2022-12-25 – 2022-12-28 (×4): 81 mg via ORAL
  Filled 2022-12-25 (×4): qty 1

## 2022-12-25 MED ORDER — ACETAMINOPHEN 650 MG RE SUPP: 650.0000 mg | Freq: Four times a day (QID) | RECTAL | Status: DC | PRN

## 2022-12-25 MED ORDER — FUROSEMIDE 10 MG/ML IJ SOLN
20.0000 mg | Freq: Once | INTRAMUSCULAR | Status: AC
  Administered 2022-12-25: 20 mg via INTRAVENOUS
  Filled 2022-12-25: qty 4

## 2022-12-25 MED ORDER — CLONAZEPAM 0.5 MG PO TABS
0.5000 mg | ORAL_TABLET | Freq: Every day | ORAL | Status: DC
  Administered 2022-12-25 – 2022-12-28 (×4): 0.5 mg via ORAL
  Filled 2022-12-25 (×4): qty 1

## 2022-12-25 MED ORDER — ONDANSETRON HCL 4 MG PO TABS: 4.0000 mg | ORAL_TABLET | Freq: Four times a day (QID) | ORAL | Status: DC | PRN

## 2022-12-25 MED ORDER — SODIUM CHLORIDE 0.9 % IV SOLN
1.0000 g | INTRAVENOUS | Status: DC
  Administered 2022-12-26 – 2022-12-28 (×3): 1 g via INTRAVENOUS
  Filled 2022-12-25 (×3): qty 10

## 2022-12-25 NOTE — ED Triage Notes (Addendum)
Pt BIB[facility]EMS for weakness from [facility]. weaker than usual. Confirmed UTI being tx c/ nitrofurantoin/m-cryst 100 mg twice a day. Since fall two weeks ago pt not moving as much as he does.  Baseline GCS 10-12 - uses wheelchair, usually able to roll around  Hx dementia  126/76 94 hr 14 rr 94% RA Cbg 167

## 2022-12-25 NOTE — ED Notes (Signed)
ED TO INPATIENT HANDOFF REPORT  ED Nurse Name and Phone #: [phone]  S Name/Age/Gender Paul Klein 67 y.o. male Room/Bed: 017C/017C  Code Status   Code Status: Full Code  Home/SNF/Other Skilled nursing facility Patient oriented to: self Is this baseline? Yes   Triage Complete: Triage complete  Chief Complaint Encephalopathy acute [G93.40]  Triage Note Pt [facility] for weakness from [facility]. weaker than usual. Confirmed UTI being tx c/ nitrofurantoin/m-cryst 100 mg twice a day. Since fall two weeks ago pt not moving as much as he does.  Baseline GCS 10-12 - uses wheelchair, usually able to roll around  Hx dementia  126/76 94 hr 14 rr 94% RA Cbg 167     Allergies Allergies  Allergen Reactions   Prozac [Fluoxetine Hcl]     SSRIs in general cause worsened depression and suicidal ideation    Level of Care/Admitting Diagnosis ED Disposition     ED Disposition  Admit   Condition  --   [place]: [place] [[id number]]  Level of Care: Med-Surg [16]  May place patient in observation at [facility] or [place] if equivalent level of care is available:: Yes  Covid Evaluation: Asymptomatic - no recent exposure (last 10 days) testing not required  Diagnosis: Encephalopathy acute [id number]  Admitting Physician: Lottie Mussel [id number]  Attending Physician: Lottie Mussel [id number]          B Medical/Surgery History Past Medical History:  Diagnosis Date   Alzheimer's disease ([place])    Anxiety    Dr. Toy Care   Diverticulosis of colon    sigmoid, per colonoscopy 2014, Dr. Benson Norway   Dyslipidemia    Hemorrhoids    internal and externa per 2014 colonoscopy   Insomnia    uses Neurontin ( Dr. Toy Care)   Normal cardiac stress test 12/2012   performed due to risk factors, abnormal EKG; Dr. Wynonia Lawman   Panic attack    Second hand smoke exposure    wife   Past Surgical History:  Procedure Laterality Date   ANAL FISSURE REPAIR      COLONOSCOPY  02/19/13   diverticulosis of sigmoid colon, external and internal hemorrhoids, Dr. Benson Norway, repeat in 10 years   SKIN BIOPSY     face, back   WISDOM TOOTH EXTRACTION       A IV Location/Drains/Wounds Patient Lines/Drains/Airways Status     Active Line/Drains/Airways     Name Placement date Placement time Site Days   Peripheral IV 12/25/22 20 G Anterior;Distal;Left;Upper Arm 12/25/22  1331  Arm  less than 1   External Urinary Catheter 12/25/22  1108  --  less than 1            Intake/Output Last 24 hours No intake or output data in the 24 hours ending 12/25/22 1335  Labs/Imaging Results for orders placed or performed during the hospital encounter of 12/25/22 (from the past 48 hour(s))  CBC with Differential     Status: Abnormal   Collection Time: 12/25/22  9:23 AM  Result Value Ref Range   WBC 14.9 (H) 4.0 - 10.5 K/uL   RBC 4.50 4.22 - 5.81 MIL/uL   Hemoglobin 12.3 (L) 13.0 - 17.0 g/dL   HCT 38.5 (L) 39.0 - 52.0 %   MCV 85.6 80.0 - 100.0 fL   MCH 27.3 26.0 - 34.0 pg   MCHC 31.9 30.0 - 36.0 g/dL   RDW 13.6 11.5 - 15.5 %   Platelets 368 150 - 400  K/uL   nRBC 0.0 0.0 - 0.2 %   Neutrophils Relative % 86 %   Neutro Abs 12.9 (H) 1.7 - 7.7 K/uL   Lymphocytes Relative 7 %   Lymphs Abs 1.0 0.7 - 4.0 K/uL   Monocytes Relative 6 %   Monocytes Absolute 0.9 0.1 - 1.0 K/uL   Eosinophils Relative 1 %   Eosinophils Absolute 0.1 0.0 - 0.5 K/uL   Basophils Relative 0 %   Basophils Absolute 0.1 0.0 - 0.1 K/uL   Immature Granulocytes 0 %   Abs Immature Granulocytes 0.05 0.00 - 0.07 K/uL    Comment: Performed at [facility] [address] [place], [place] [address]  Comprehensive metabolic panel     Status: Abnormal   Collection Time: 12/25/22  9:23 AM  Result Value Ref Range   Sodium 136 135 - 145 mmol/L   Potassium 4.1 3.5 - 5.1 mmol/L   Chloride 99 98 - 111 mmol/L   CO2 23 22 - 32 mmol/L   Glucose, Bld 140 (H) 70 - 99 mg/dL    Comment: Glucose reference  range applies only to samples taken after fasting for at least 8 hours.   BUN 9 8 - 23 mg/dL   Creatinine, Ser 0.70 0.61 - 1.24 mg/dL   Calcium 8.9 8.9 - 10.3 mg/dL   Total Protein 6.5 6.5 - 8.1 g/dL   Albumin 2.9 (L) 3.5 - 5.0 g/dL   AST 16 15 - 41 U/L   ALT 17 0 - 44 U/L   Alkaline Phosphatase 80 38 - 126 U/L   Total Bilirubin 0.7 0.3 - 1.2 mg/dL   GFR, Estimated >60 >60 mL/min    Comment: (NOTE) Calculated using the CKD-EPI Creatinine Equation (2021)    Anion gap 14 5 - 15    Comment: Performed at [facility] [address], [place], [place] [address]  Magnesium     Status: None   Collection Time: 12/25/22  9:23 AM  Result Value Ref Range   Magnesium 2.1 1.7 - 2.4 mg/dL    Comment: Performed at [facility] [address], [place], [place] [address]  Troponin I (High Sensitivity)     Status: None   Collection Time: 12/25/22  9:23 AM  Result Value Ref Range   Troponin I (High Sensitivity) 5 <18 ng/L    Comment: (NOTE) Elevated high sensitivity troponin I (hsTnI) values and significant  changes across serial measurements may suggest ACS but many other  chronic and acute conditions are known to elevate hsTnI results.  Refer to the "Links" section for chest pain algorithms and additional  guidance. Performed at [facility], [place] [address] [place], [place] [address]   Brain natriuretic peptide     Status: Abnormal   Collection Time: 12/25/22  9:23 AM  Result Value Ref Range   B Natriuretic Peptide 208.2 (H) 0.0 - 100.0 pg/mL    Comment: Performed at [facility] [address], [place], [place] [address]  SARS Coronavirus 2 by RT PCR (hospital order, performed in [facility] hospital lab) *cepheid single result test* Anterior Nasal Swab     Status: None   Collection Time: 12/25/22  9:29 AM   Specimen: Anterior Nasal Swab  Result Value Ref Range   SARS Coronavirus 2 by RT PCR NEGATIVE NEGATIVE    Comment: Performed at [facility],  [address] [place],  [address]  I-Stat venous blood gas, (  [facility], [facility], [facility])     Status: Abnormal   Collection Time: 12/25/22  9:47 AM  Result Value Ref Range   pH, Ven 7.506 (H) 7.25 - 7.43   pCO2, Ven 32.4 (L) 44 - 60 mmHg   pO2, Ven 115 (H) 32 - 45 mmHg   Bicarbonate 25.6 20.0 - 28.0 mmol/L   TCO2 27 22 - 32 mmol/L   O2 Saturation 99 %   Acid-Base Excess 3.0 (H) 0.0 - 2.0 mmol/L   Sodium 136 135 - 145 mmol/L   Potassium 4.0 3.5 - 5.1 mmol/L   Calcium, Ion 1.10 (L) 1.15 - 1.40 mmol/L   HCT 39.0 39.0 - 52.0 %   Hemoglobin 13.3 13.0 - 17.0 g/dL   Sample type VENOUS   Urinalysis, w/ Reflex to Culture (Infection Suspected) -Urine, Clean Catch     Status: Abnormal   Collection Time: 12/25/22 11:11 AM  Result Value Ref Range   Specimen Source URINE, CLEAN CATCH    Color, Urine YELLOW YELLOW   APPearance HAZY (A) CLEAR   Specific Gravity, Urine 1.006 1.005 - 1.030   pH 7.0 5.0 - 8.0   Glucose, UA NEGATIVE NEGATIVE mg/dL   Hgb urine dipstick SMALL (A) NEGATIVE   Bilirubin Urine NEGATIVE NEGATIVE   Ketones, ur NEGATIVE NEGATIVE mg/dL   Protein, ur NEGATIVE NEGATIVE mg/dL   Nitrite NEGATIVE NEGATIVE   Leukocytes,Ua LARGE (A) NEGATIVE   RBC / HPF 6-10 0 - 5 RBC/hpf   WBC, UA 21-50 0 - 5 WBC/hpf    Comment:        Reflex urine culture not performed if WBC <=10, OR if Squamous epithelial cells >5. If Squamous epithelial cells >5 suggest recollection.    Bacteria, UA RARE (A) NONE SEEN   Squamous Epithelial / HPF 0-5 0 - 5 /HPF   Mucus PRESENT     Comment: Performed at [facility], [place] [address] [place], [place] [address]  Urine Culture     Status: None (Preliminary result)   Collection Time: 12/25/22 11:11 AM   Specimen: Urine, Random  Result Value Ref Range   Specimen Description URINE, RANDOM    Special Requests      URINE, CLEAN CATCH Performed at [facility], [place] [address], [place], [place] [address]    Culture PENDING    Report Status PENDING    Troponin I (High Sensitivity)     Status: None   Collection Time: 12/25/22 11:42 AM  Result Value Ref Range   Troponin I (High Sensitivity) 4 <18 ng/L    Comment: (NOTE) Elevated high sensitivity troponin I (hsTnI) values and significant  changes across serial measurements may suggest ACS but many other  chronic and acute conditions are known to elevate hsTnI results.  Refer to the "Links" section for chest pain algorithms and additional  guidance. Performed at [facility], [place] [address] [place], [place] [address]    CT Head Wo Contrast  Result Date: 12/25/2022 CLINICAL DATA:  Mental status change of unknown etiology. EXAM: CT HEAD WITHOUT CONTRAST TECHNIQUE: Contiguous axial images were obtained from the base of the skull through the vertex without intravenous contrast. RADIATION DOSE REDUCTION: This exam was performed according to the departmental dose-optimization program which includes automated exposure control, adjustment of the mA and/or kV according to patient size and/or use of iterative reconstruction technique. COMPARISON:  12/13/2022 FINDINGS: Brain: No evidence of acute infarction, hemorrhage, hydrocephalus, extra-axial collection or mass lesion/mass effect. There  is mild diffuse low-attenuation within the subcortical and periventricular white matter compatible with chronic microvascular disease. Prominence of sulci and ventricles compatible with brain atrophy Vascular: No hyperdense vessel or unexpected calcification. Skull: Normal. Negative for fracture or focal lesion. Sinuses/Orbits: Paranasal sinuses and the mastoid air cells are clear. Other: None IMPRESSION: 1. No acute intracranial abnormalities. 2. Chronic microvascular disease and brain atrophy. Electronically Signed   By: Kerby Moors M.D.   On: 12/25/2022 10:10   DG Chest Portable 1 View  Result Date: 12/25/2022 CLINICAL DATA:  Weakness. EXAM: PORTABLE CHEST 1 VIEW COMPARISON:  07/15/2021 FINDINGS: Stable  cardiomediastinal contours. Pulmonary vascular congestion. New left pleural effusion. Decreased aeration to the left base may reflect atelectasis and or airspace disease. Visualized osseous structures are unremarkable. IMPRESSION: 1. New left pleural effusion with decreased aeration to the left base. 2. Pulmonary vascular congestion. Electronically Signed   By: Kerby Moors M.D.   On: 12/25/2022 09:43    Pending Labs Unresulted Labs (From admission, onward)     Start     Ordered   12/26/22 [id number]  Basic metabolic panel  Tomorrow morning,   R        12/25/22 1333   12/26/22 0500  CBC  Tomorrow morning,   R        12/25/22 1333   12/25/22 1334  Phosphorus  Once,   R        12/25/22 1333   12/25/22 1334  TSH  Once,   R        12/25/22 1333   12/25/22 1332  HIV Antibody (routine testing w rflx)  (HIV Antibody (Routine testing w reflex) panel)  Once,   R        12/25/22 1333            Vitals/Pain Today's Vitals   12/25/22 0945 12/25/22 1030 12/25/22 1130 12/25/22 1238  BP: 120/66 114/87 (!) 147/72 (!) 151/75  Pulse: 89 93 99 91  Resp: 15 12 16 14   Temp:      SpO2: 97% 96% 96% 95%    Isolation Precautions Airborne and Contact precautions  Medications Medications  cefTRIAXone (ROCEPHIN) 1 g in sodium chloride 0.9 % 100 mL IVPB (1 g Intravenous New Bag/Given 12/25/22 1331)  enoxaparin (LOVENOX) injection 40 mg (has no administration in time range)  acetaminophen (TYLENOL) tablet 650 mg (has no administration in time range)    Or  acetaminophen (TYLENOL) suppository 650 mg (has no administration in time range)  ondansetron (ZOFRAN) tablet 4 mg (has no administration in time range)    Or  ondansetron (ZOFRAN) injection 4 mg (has no administration in time range)    Mobility Wheelchair and walker      R Recommendations: See Admitting Provider Note  Report given to:   Additional Notes:

## 2022-12-25 NOTE — ED Provider Notes (Signed)
[facility] Provider Note   CSN: [id number] Arrival date & time: 12/25/22  [id number]     History  Chief Complaint  Patient presents with   Weakness    Paul Klein is a 67 y.o. male.  67 year old male with history of dementia presents today for worsening weakness from nursing home.  He does not provide any history.  Level 5 caveat due to patient's acuity and dementia.  Per report from EMS patient has been diagnosed with UTI, has been taking nitrofurantoin but his weakness has progressively worsened.  No other complaints.  He was seen a couple weeks ago for a fall and head injury.  The history is provided by the patient. No language interpreter was used.       Home Medications Prior to Admission medications   Medication Sig Start Date End Date Taking? Authorizing Provider  ARIPiprazole (ABILIFY) 5 MG tablet Take 10 mg by mouth 2 (two) times daily. 06/27/21   [provider]  ASPIRIN LOW DOSE 81 MG EC tablet TAKE 1 TABLET(81 MG) BY MOUTH DAILY Patient taking differently: Take 81 mg by mouth at bedtime. 06/28/21   Tysinger, Camelia Eng, PA-C  clonazePAM (KLONOPIN) 0.5 MG tablet Take 1 tablet (0.5 mg total) by mouth daily as needed for up to 20 days for anxiety. Patient taking differently: Take 0.5 mg by mouth in the morning, at noon, and at bedtime. 06/15/21 07/16/21  Tysinger, Camelia Eng, PA-C  doxazosin (CARDURA) 2 MG tablet Take 1 tablet (2 mg total) by mouth daily. Patient taking differently: Take 2 mg by mouth at bedtime. 02/24/21 02/24/22  Tysinger, Camelia Eng, PA-C  escitalopram (LEXAPRO) 10 MG tablet Take 1 tablet (10 mg total) by mouth daily. Patient taking differently: Take 10 mg by mouth at bedtime. 02/23/21   Cameron Sprang, MD  gabapentin (NEURONTIN) 800 MG tablet Take 1.5 tablets (1,200 mg total) by mouth at bedtime. 04/21/21   Cameron Sprang, MD  haloperidol (HALDOL) 2 MG tablet Take 2 mg by mouth daily. 06/27/21   [provider]  hydrocortisone 2.5 % cream Apply topically 2 (two) times daily. Patient not taking: No sig reported 06/02/21   Tysinger, Camelia Eng, PA-C  melatonin 3 MG TABS tablet Take 1 tablet (3 mg total) by mouth at bedtime. Patient not taking: No sig reported 09/11/20   Cameron Sprang, MD  Multiple Vitamin (MULTIVITAMIN) tablet Take 1 tablet by mouth at bedtime.    [provider]  Omega-3 Fatty Acids (FISH OIL) 1000 MG CAPS Take 2,000 mg by mouth at bedtime.    [provider]  QUEtiapine (SEROQUEL) 25 MG tablet Take 1 tablet (25 mg total) by mouth 2 (two) times daily. Patient not taking: No sig reported 06/02/21   Tysinger, Camelia Eng, PA-C  rosuvastatin (CRESTOR) 20 MG tablet Take 1 tablet (20 mg total) by mouth at bedtime. 07/19/21   Tysinger, Camelia Eng, PA-C      Allergies    Prozac [fluoxetine hcl]    Review of Systems   Review of Systems  Unable to perform ROS: Dementia  All other systems reviewed and are negative.   Physical Exam Updated Vital Signs BP 135/77   Pulse 89   Temp 97.6 F (36.4 C)   Resp 16   SpO2 95%  Physical Exam Vitals and nursing note reviewed.  Constitutional:      General: He is not in acute distress.    Appearance: Normal appearance.  He is not ill-appearing.  HENT:     Head: Normocephalic and atraumatic.     Nose: Nose normal.  Eyes:     General: No scleral icterus.    Extraocular Movements: Extraocular movements intact.     Conjunctiva/sclera: Conjunctivae normal.  Cardiovascular:     Rate and Rhythm: Normal rate and regular rhythm.     Pulses: Normal pulses.  Pulmonary:     Effort: Pulmonary effort is normal. No respiratory distress.     Breath sounds: Normal breath sounds. No wheezing or rales.  Abdominal:     General: There is no distension.     Palpations: Abdomen is soft.     Tenderness: There is no abdominal tenderness. There is no guarding.  Musculoskeletal:        General: Normal range of motion.     Cervical back: Normal  range of motion.  Skin:    General: Skin is warm and dry.  Neurological:     Mental Status: He is alert.     ED Results / Procedures / Treatments   Labs (all labs ordered are listed, but only abnormal results are displayed) Labs Reviewed  SARS CORONAVIRUS 2 BY RT PCR  CBC WITH DIFFERENTIAL/PLATELET  COMPREHENSIVE METABOLIC PANEL  MAGNESIUM  BRAIN NATRIURETIC PEPTIDE  URINALYSIS, W/ REFLEX TO CULTURE (INFECTION SUSPECTED)  I-STAT VENOUS BLOOD GAS, ED  TROPONIN I (HIGH SENSITIVITY)    EKG EKG Interpretation  Date/Time:  Sunday December 25 2022 09:16:31 EDT Ventricular Rate:  90 PR Interval:  152 QRS Duration: 95 QT Interval:  444 QTC Calculation: 544 R Axis:   84 Text Interpretation: Sinus rhythm Consider right atrial enlargement Borderline right axis deviation Nonspecific T abnormalities, inferior leads Prolonged QT interval New T wave inversions diffusely, artifact present multiple leads Confirmed by Georgina Snell [phone]) on 12/25/2022 9:23:28 AM  Radiology No results found.  Procedures .Suture Removal  Date/Time: 12/25/2022 1:37 PM  Performed by: Evlyn Courier, PA-C Authorized by: Evlyn Courier, PA-C   Consent:    Consent obtained:  Verbal   Consent given by:  Patient   Risks discussed:  Bleeding   Alternatives discussed:  No treatment Universal protocol:    Procedure explained and questions answered to patient or proxy's satisfaction: yes     Relevant documents present and verified: yes     Test results available: yes     Patient identity confirmed:  Verbally with patient Location:    Location:  [place] location:  [place]   Ey[place] location:  L eyebrow Procedure details:    Wound appearance:  No signs of infection and good wound healing   Number of sutures removed:  3 Post-procedure details:    Post-removal:  No dressing applied   Procedure completion:  Tolerated well, no immediate complications     Medications Ordered in ED Medications -  No data to display  ED Course/ Medical Decision Making/ A&P                             Medical Decision Making Amount and/or Complexity of Data Reviewed Labs: ordered. Radiology: ordered.  Risk Decision regarding hospitalization.   Medical Decision Making / ED Course   This patient presents to the ED for concern of weakness, this involves an extensive number of treatment options, and is a complaint that carries with it a high risk of complications and morbidity.  The differential diagnosis includes UTI, pneumonia, ACS, intracranial  injury, viral URI, electrolyte derangement  MDM: 67 year old male presents today for worsening weakness from nursing home.  Recently diagnosed with UTI and is on nitrofurantoin.  No additional history.  Will reach out to nursing home.  Patient does have new T wave inversions on EKG diffusely.  He is arousable to voice otherwise does not engage in interview.  His speech is unintelligible.  Will obtain ACS workup, obtain CT head due to mental status change, obtain chest x-ray, blood work including VBG.  CBC shows leukocytosis with mild left shift, mild anemia.  CMP with preserved renal function, glucose of 140 otherwise without acute findings.  UA shows large leukocytes, 21-50 WBCs.  VBG without acute concerns.  Chest x-ray shows small pleural effusion otherwise without acute findings.  COVID-negative.  Troponin negative x 2.  Will give dose of Rocephin.  Given worsening mental status, and the reported weakness will discuss with hospitalist for admission.  Discussed with internal medicine team.  They will evaluate patient for admission.   Lab Tests: -I ordered, reviewed, and interpreted labs.   The pertinent results include:   Labs Reviewed  SARS CORONAVIRUS 2 BY RT PCR  CBC WITH DIFFERENTIAL/PLATELET  COMPREHENSIVE METABOLIC PANEL  MAGNESIUM  BRAIN NATRIURETIC PEPTIDE  URINALYSIS, W/ REFLEX TO CULTURE (INFECTION SUSPECTED)  I-STAT VENOUS BLOOD GAS, ED   TROPONIN I (HIGH SENSITIVITY)      EKG  EKG Interpretation  Date/Time:  Sunday December 25 2022 09:16:31 EDT Ventricular Rate:  90 PR Interval:  152 QRS Duration: 95 QT Interval:  444 QTC Calculation: 544 R Axis:   84 Text Interpretation: Sinus rhythm Consider right atrial enlargement Borderline right axis deviation Nonspecific T abnormalities, inferior leads Prolonged QT interval New T wave inversions diffusely, artifact present multiple leads Confirmed by Georgina Snell [phone]) on 12/25/2022 9:23:28 AM         Imaging Studies ordered: I ordered imaging studies including chest x-ray, CT head I independently visualized and interpreted imaging. I agree with the radiologist interpretation   Medicines ordered and prescription drug management: No orders of the defined types were placed in this encounter.   -I have reviewed the patients home medicines and have made adjustments as needed   Reevaluation: After the interventions noted above, I reevaluated the patient and found that they have :stayed the same  Co morbidities that complicate the patient evaluation  Past Medical History:  Diagnosis Date   Alzheimer's disease ([place])    Anxiety    Dr. Toy Care   Diverticulosis of colon    sigmoid, per colonoscopy 2014, Dr. Benson Norway   Dyslipidemia    Hemorrhoids    internal and externa per 2014 colonoscopy   Insomnia    uses Neurontin ( Dr. Toy Care)   Normal cardiac stress test 12/2012   performed due to risk factors, abnormal EKG; Dr. Wynonia Lawman   Panic attack    Second hand smoke exposure    wife      Dispostion: Patient discussed with internal medicine team.  They will evaluate for admission.  Final Clinical Impression(s) / ED Diagnoses Final diagnoses:  Acute cystitis without hematuria    Rx / DC Orders ED Discharge Orders     None         Evlyn Courier, PA-C 12/25/22 1337    Elgie Congo, MD 12/26/22 [phone]

## 2022-12-25 NOTE — ED Notes (Signed)
Got patient undressed int a gown on the monitor did EKG shown to Dr Nechama Guard patient is resting with nurse at bedside and call bell in reach

## 2022-12-25 NOTE — Hospital Course (Addendum)
Paul Klein is a 67 y.o. male with a pertinent history of Alzheimer's dementia, hyperlipidemia who presents for worsening encephalopathy.  Patient admitted for further evaluation and management of acute multifactorial encephalopathy.   #Acute multifactorial encephalopathy #History of Alzheimer's dementia #Generalized weakness Patient initially presented to the emergency department with altered mental status and weakness.  Over the past couple weeks, patient has been slowly declining ever since he had a fall.  Patient also was getting treated for a UTI in the outpatient setting.  On arrival, patient was not communicative, patient was not able to verbalize anything.  Patient was not following commands.  Patient had imaging, showing no structural concerns.  Labs did not point towards any reversible metabolic causes.  Vitamin levels within normal limits.  Thyroid within normal limits.  Did obtain cultures which did not grow any bacteria.  Did initially start ceftriaxone for UTI to complete treatment.  Patient completed treatment, and patient's mental status still did not improve.  Held centrally acting medications see if patient could improve, and did not.  Given no reversible causes were found, and patient had recent insult, I am afraid this could be patient as needed baseline given history of Alzheimer's dementia.  Did communicate this with family, and patient family understood the concerns.  Patient worked with PT/OT which patient required total assist which was moved from previous baseline.  Patient discharged back to his facility with total care.  Discontinue memantine and donepezil given progressive dementia.   #Urinary tract infection, resolved Patient completed antibiotic course during hospitalization.  Urinary culture grew no bacteria.   #Left pleural effusion Did speak to family today about this.  Stating his mental status would not allow for him to tolerate a thoracentesis.  Family agreed.   Patient remains hemodynamically stable and not requiring oxygen.  Patient remained afebrile.  White count did start to trend down.  Did not pursue thoracentesis during hospitalization.   #Left knee pain Left knee x-ray showed no fractures or abscesses. There was some osteoarthritis noted.   #Goals of care Spoke to family at bedside today.  Patient is a DNR/DNI.  They did understand that this could be his new baseline. They did state that they do not want any heroic measures taken for him if it came to it.
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
## 2022-12-26 ENCOUNTER — Observation Stay (HOSPITAL_COMMUNITY): Payer: Medicare HMO

## 2022-12-26 DIAGNOSIS — R531 Weakness: Secondary | ICD-10-CM | POA: Diagnosis not present

## 2022-12-26 DIAGNOSIS — N390 Urinary tract infection, site not specified: Secondary | ICD-10-CM | POA: Diagnosis not present

## 2022-12-26 DIAGNOSIS — G9340 Encephalopathy, unspecified: Secondary | ICD-10-CM | POA: Diagnosis not present

## 2022-12-26 DIAGNOSIS — F0280 Dementia in other diseases classified elsewhere without behavioral disturbance: Secondary | ICD-10-CM

## 2022-12-26 DIAGNOSIS — G309 Alzheimer's disease, unspecified: Secondary | ICD-10-CM

## 2022-12-26 LAB — IRON AND TIBC
Iron: 29 ug/dL — ABNORMAL LOW (ref 45–182)
Saturation Ratios: 10 % — ABNORMAL LOW (ref 17.9–39.5)
TIBC: 305 ug/dL (ref 250–450)
UIBC: 276 ug/dL

## 2022-12-26 LAB — CBC
HCT: 39.1 % (ref 39.0–52.0)
Hemoglobin: 12.5 g/dL — ABNORMAL LOW (ref 13.0–17.0)
MCH: 27.2 pg (ref 26.0–34.0)
MCHC: 32 g/dL (ref 30.0–36.0)
MCV: 85 fL (ref 80.0–100.0)
Platelets: 375 10*3/uL (ref 150–400)
RBC: 4.6 MIL/uL (ref 4.22–5.81)
RDW: 14 % (ref 11.5–15.5)
WBC: 10.6 10*3/uL — ABNORMAL HIGH (ref 4.0–10.5)
nRBC: 0 % (ref 0.0–0.2)

## 2022-12-26 LAB — BASIC METABOLIC PANEL
Anion gap: 11 (ref 5–15)
BUN: 12 mg/dL (ref 8–23)
CO2: 27 mmol/L (ref 22–32)
Calcium: 8.8 mg/dL — ABNORMAL LOW (ref 8.9–10.3)
Chloride: 100 mmol/L (ref 98–111)
Creatinine, Ser: 0.86 mg/dL (ref 0.61–1.24)
GFR, Estimated: >60 mL/min (ref >60)
Glucose, Bld: 127 mg/dL — ABNORMAL HIGH (ref 70–99)
Potassium: 4 mmol/L (ref 3.5–5.1)
Sodium: 138 mmol/L (ref 135–145)

## 2022-12-26 LAB — FERRITIN: Ferritin: 357 ng/mL — ABNORMAL HIGH (ref 24–336)

## 2022-12-26 LAB — URINE CULTURE: Culture: NO GROWTH

## 2022-12-26 LAB — VITAMIN B12: Vitamin B-12: 455 pg/mL (ref 180–914)

## 2022-12-26 NOTE — Evaluation (Signed)
Physical Therapy Evaluation Patient Details Name: ACESYN MAIA MRN: [id number] DOB: 1956/03/05 Today's Date: 12/26/2022  History of Present Illness  67 yo male presenting to [facility] ED on 3/31 with weakness, UTI being treated PTA, possible HF. Recent ED admission 3/19 for falls, d/c same day. PMH includes alzheimer's, anxiety.  Clinical Impression   Pt presents with generalized weakness, L knee pain with bruising noted, impaired balance with history of falls, impaired skin integrity, impaired cognition, and decreased activity tolerance. Pt to benefit from acute PT to address deficits. Pt requiring total +2 for bed mobility and attempted transfer into stand, pt limited in tolerance given lack of command following, L knee pain, and fatigue. PT to progress mobility as tolerated, and will continue to follow acutely.         Recommendations for follow up therapy are one component of a multi-disciplinary discharge planning process, led by the attending physician.  Recommendations may be updated based on patient status, additional functional criteria and insurance authorization.  Follow Up Recommendations       Assistance Recommended at Discharge Frequent or constant Supervision/Assistance  Patient can return home with the following  Two people to help with walking and/or transfers;Two people to help with bathing/dressing/bathroom    Equipment Recommendations None recommended by PT  Recommendations for Other Services       Functional Status Assessment Patient has had a recent decline in their functional status and/or demonstrates limited ability to make significant improvements in function in a reasonable and predictable amount of time     Precautions / Restrictions Precautions Precautions: Fall;Other (comment) Precaution Comments: watch for skin breakdown (redness to heels) Restrictions Weight Bearing Restrictions: No      Mobility  Bed Mobility Overal bed mobility: Needs Assistance Bed  Mobility: Supine to Sit, Sit to Supine     Supine to sit: Total assist, +2 for physical assistance Sit to supine: Total assist, +2 for physical assistance   General bed mobility comments: assist for all aspects, helicopter method utilized for to/from EOB.    Transfers Overall transfer level: Needs assistance Equipment used: 2 person hand held assist Transfers: Sit to/from Stand Sit to Stand: Total assist, +2 physical assistance           General transfer comment: unable to come to full standing, total +2 to clear hips with bed pad. x2 attempts, pt appears limited by severe L knee pain    Ambulation/Gait                  Stairs            Wheelchair Mobility    Modified Rankin (Stroke Patients Only)       Balance Overall balance assessment: Needs assistance, History of Falls Sitting-balance support: No upper extremity supported, Feet supported Sitting balance-Leahy Scale: Fair Sitting balance - Comments: able to sit EOB unsupported, prefers HHA from therapist for comfort Postural control: Posterior lean (dynamically) Standing balance support: Bilateral upper extremity supported, During functional activity Standing balance-Leahy Scale: Zero Standing balance comment: unable to stand with total +2                             Pertinent Vitals/Pain Pain Assessment Pain Assessment: Faces Faces Pain Scale: Hurts even more Pain Location: L knee Pain Descriptors / Indicators: Grimacing, Moaning, Guarding Pain Intervention(s): Limited activity within patient's tolerance, Monitored during session, Repositioned    Home Living Family/patient expects to be discharged  to:: Assisted living                 Home Equipment: Wheelchair - manual      Prior Function Prior Level of Function : History of Falls (last six months);Patient poor historian/Family not available             Mobility Comments: per chart review, pt is wheelchair-level.  Prior to fall 2 weeks ago, pt propelling self in w/c and more interactive with staff ADLs Comments: unsure of baseline     Hand Dominance   Dominant Hand: Right    Extremity/Trunk Assessment   Upper Extremity Assessment Upper Extremity Assessment: Defer to OT evaluation    Lower Extremity Assessment Lower Extremity Assessment: Difficult to assess due to impaired cognition;LLE deficits/detail LLE Deficits / Details: assessment limited by pt-guarding; tolerated PROM hip and knee flexion/extension ~50% ROM LLE: Unable to fully assess due to pain    Cervical / Trunk Assessment Cervical / Trunk Assessment: Kyphotic  Communication   Communication: Expressive difficulties (history of alzheimer's)  Cognition Arousal/Alertness: Lethargic (especially when supine) Behavior During Therapy: Flat affect, Restless Overall Cognitive Status: History of cognitive impairments - at baseline                                 General Comments: at baseline, pt oriented to self only. Pt A&Ox0 today, does not follow any one-step mobility commands        General Comments General comments (skin integrity, edema, etc.): scab on crown of head, bruising to L knee, redness to bilat heels (propped with pillow)    Exercises     Assessment/Plan    PT Assessment Patient needs continued PT services  PT Problem List Decreased strength;Decreased mobility;Decreased activity tolerance;Decreased balance;Decreased knowledge of use of DME;Pain;Decreased cognition       PT Treatment Interventions Therapeutic activities;DME instruction;Therapeutic exercise;Patient/family education;Balance training;Functional mobility training;Neuromuscular re-education    PT Goals (Current goals can be found in the Care Plan section)  Acute Rehab PT Goals PT Goal Formulation: Patient unable to participate in goal setting Time For Goal Achievement: 01/09/23 Potential to Achieve Goals: Fair    Frequency Min  2X/week     Co-evaluation PT/OT/SLP Co-Evaluation/Treatment: Yes Reason for Co-Treatment: For patient/therapist safety;To address functional/ADL transfers PT goals addressed during session: Mobility/safety with mobility;Balance OT goals addressed during session: ADL's and self-care       AM-PAC PT "6 Clicks" Mobility  Outcome Measure Help needed turning from your back to your side while in a flat bed without using bedrails?: A Lot Help needed moving from lying on your back to sitting on the side of a flat bed without using bedrails?: A Lot Help needed moving to and from a bed to a chair (including a wheelchair)?: Total Help needed standing up from a chair using your arms (e.g., wheelchair or bedside chair)?: Total Help needed to walk in hospital room?: Total Help needed climbing 3-5 steps with a railing? : Total 6 Click Score: 8    End of Session   Activity Tolerance: Patient limited by pain;Patient limited by fatigue Patient left: in bed;with call bell/phone within reach;with bed alarm set Nurse Communication: Mobility status PT Visit Diagnosis: Other abnormalities of gait and mobility (R26.89)    Time: [id number] PT Time Calculation (min) (ACUTE ONLY): 23 min   Charges:   PT Evaluation $PT Eval Low Complexity: 1 Low  Stacie Glaze, PT DPT Acute Rehabilitation Services Pager [phone]  Office [phone]   Louis Matte 12/26/2022, 12:35 PM

## 2022-12-26 NOTE — Progress Notes (Signed)
Subjective:   Hospital day:1   Overnight event: No acute events overnight  Interim History: Patient evaluated laying comfortably in bed. Eyes open and responds to voice but does not follow commands. Able to state name but no other words.  Objective:  Vital signs in last 24 hours: Vitals:   12/25/22 2315 12/26/22 0351 12/26/22 0735 12/26/22 1126  BP: 120/78 134/78 (!) 155/82 (!) 141/82  Pulse: 98 91 95 87  Resp: 16 16 17 17   Temp: 97.9 F (36.6 C) 98.5 F (36.9 C) 98.6 F (37 C) 98.1 F (36.7 C)  TempSrc: Axillary Axillary Axillary Oral  SpO2: 92% 94% 96% 97%  Weight:      Height:        Filed Weights   12/25/22 1442  Weight: 73.1 kg     Intake/Output Summary (Last 24 hours) at 12/26/2022 1459 Last data filed at 12/26/2022 1437 Gross per 24 hour  Intake 118 ml  Output 825 ml  Net -707 ml   Net IO Since Admission: -810.33 mL [12/26/22 1459]  No results for input(s): "GLUCAP" in the last 72 hours.   Pertinent Labs:    Latest Ref Rng & Units 12/26/2022    4:32 AM 12/25/2022    9:47 AM 12/25/2022    9:23 AM  CBC  WBC 4.0 - 10.5 K/uL 10.6   14.9   Hemoglobin 13.0 - 17.0 g/dL 12.5  13.3  12.3   Hematocrit 39.0 - 52.0 % 39.1  39.0  38.5   Platelets 150 - 400 K/uL 375   368        Latest Ref Rng & Units 12/26/2022    4:32 AM 12/25/2022    9:47 AM 12/25/2022    9:23 AM  CMP  Glucose 70 - 99 mg/dL 127   140   BUN 8 - 23 mg/dL 12   9   Creatinine 0.61 - 1.24 mg/dL 0.86   0.70   Sodium 135 - 145 mmol/L 138  136  136   Potassium 3.5 - 5.1 mmol/L 4.0  4.0  4.1   Chloride 98 - 111 mmol/L 100   99   CO2 22 - 32 mmol/L 27   23   Calcium 8.9 - 10.3 mg/dL 8.8   8.9   Total Protein 6.5 - 8.1 g/dL   6.5   Total Bilirubin 0.3 - 1.2 mg/dL   0.7   Alkaline Phos 38 - 126 U/L   80   AST 15 - 41 U/L   16   ALT 0 - 44 U/L   17     Imaging: No results found.  Physical Exam  General: Chronically ill elderly man laying in bed.. No acute distress. CV: RRR. No m/r/g. No LE  edema Pulmonary: Lungs CTAB. Normal effort. No wheezing or rales. Extremities: 2+ distal pulses. Normal ROM. Skin: Warm and dry. No obvious rash or lesions. MSK: No tenderness to palpation of bilateral knee and hips. Left knee w/ healed bruise but no crepitus or effusion. Normal ROM.  Neuro: Eyes open spontaneously. Responds to voice and able to state name but not oriented to place, situation or time.  Does not follow commands. Moves all extremities.    Assessment/Plan: Paul Klein is a 67 y.o. male with hx of alzheimer's dementia and HLD who presented from his nursing home facility for worsening weakness and changes in his mental status and admitted for UTI.  Principal Problem:   Encephalopathy Active Problems:   Weakness  UTI (urinary tract infection)   Alzheimer's disease  #Acute encephalopathy #Hx Alzheimer's dementia #Hx of recurrent falls #Generalized weakness Patient's remain oriented to self only. I called patient's memory care unit for collateral and was informed that patient has had significant decline in his mobility since his first fall on 3/19. Prior to the fall, he was ambulating without an assistive device but shuffles his feet. Since the fall, he has been wheelchair-bound and he has also become more lethargic and confused. Previously, he was able to interact with nursing staff and other residents but since the fall, he only says a few words and mumbles. Metabolic workup for his encephalopathy has been overall unrevealing with normal electrolytes, TSH and vitamin B12. No structural cause on CT head. His worsening encephalopathy likely a combination of current UTI, centrally-acting meds with some progression of his dementia.  -Delirium precautions -Continue to hold centrally acting meds -Continue Klonopin 0.5 mg at bedtime -PT/OT  #UTI WBC trended down from 14.9->10.6. He remains afebrile. Urine culture no growth in 24 hours. -Continue IV Rocephin -F/u urine  culture -Trend CBC/fever curve  #Left knee pain Per facility, patient has been complaining of leg pain since his first fall about 2 weeks ago. On exam, there is a small bruise around the left knee that has healed. Due to patient's mental status I am unable to assess if he has tenderness to the knee however there is no noticeable crepitus or effusion. Range of motion seems to be intact. There are no recent imaging of the knee so I will get an x-ray of the knee to further assess. -X-ray left knee  #Anemia of chronic disease #Normocytic anemia Hemoglobin stable at 12.5. Vitamin B12 within normal limits. Iron studies consistent with anemia of inflammation. -Daily CBC  #L-sided pleural effusion #HFpEF Last TTE available in the chart was from 2021 and he was noted to have normal EF with G1DD. CXR on admission showed new left pleural effusion with decreased aeration to the left base. Patient is in no respiratory respiratory distress and has no lower extremity edema.  Patient looks slightly dehydrated on exam. -Hold home Lasix  #GOC I had a long discussion with the patient's spouse, Keldrick Tiffany, who is also his POA concerning patient's goals of care and CODE STATUS. She informs me that in the setting of a cardiac arrest or respiratory distress, patient would not want to be resuscitated.  States she has had discussions with [facility] in the past but has not formally documented this.  She would like his CODE STATUS changed to DNR. -Change CODE STATUS from full code to DNR  Diet: Regular IVF: None VTE: Lovenox CODE: DNR  Prior to Admission Living Arrangement: [facility] care,  Anticipated Discharge Location: Pending Barriers to Discharge: Medical stability Dispo: Anticipated discharge in approximately 1-2 day(s).   Signed: Lacinda Axon, MD 12/26/2022, 2:59 PM  Pager: [phone] Internal Medicine Teaching Service After 5pm on weekdays and 1pm on weekends: On Call pager:  [phone]

## 2022-12-26 NOTE — TOC Initial Note (Signed)
Transition of Care [facility]) - Initial/Assessment Note    Patient Details  Name: Paul Klein MRN: [id number] Date of Birth: 04/12/1956  Transition of Care [facility]) CM/SW Contact:    Geralynn Ochs, LCSW Phone Number: 12/26/2022, 11:24 AM  Clinical Narrative:       Patient is from [facility] memory care, where he has been for about a year. CSW spoke with wife, Shauna Hugh, to confirm plan to return when stable. Per wife, patient has been wheelchair bound for the past week after he had a fall. He has also been complaining of leg pain. CSW relayed information to MD, pending therapy evals. CSW to follow.            Expected Discharge Plan: Memory Care Barriers to Discharge: Continued Medical Work up   Patient Goals and CMS Choice Patient states their goals for this hospitalization and ongoing recovery are:: patient unable CMS Medicare.gov Compare Post Acute Care list provided to:: Patient Represenative (must comment) Choice offered to / list presented to : Faith ownership interest in [facility].provided to:: Spouse    Expected Discharge Plan and Services     Post Acute Care Choice: Nursing Home Living arrangements for the past 2 months: [place]                                      Prior Living Arrangements/Services Living arrangements for the past 2 months: [place] Lives with:: Facility Resident Patient language and need for interpreter reviewed:: No Do you feel safe going back to the place where you live?: Yes      Need for Family Participation in Patient Care: Yes (Comment) Care giver support system in place?: Yes (comment) Current home services: DME Criminal Activity/Legal Involvement Pertinent to Current Situation/Hospitalization: No - Comment as needed  Activities of Daily Living      Permission Sought/Granted Permission sought to share information with : Facility Sport and exercise psychologist, Family  Supports Permission granted to share information with : Yes, Verbal Permission Granted  Share Information with NAME: Diane  Permission granted to share info w AGENCY: [place] granted to share info w Relationship: Spouse     Emotional Assessment   Attitude/Demeanor/Rapport: Unable to Assess Affect (typically observed): Unable to Assess Orientation: : Oriented to Self Alcohol / Substance Use: Not Applicable Psych Involvement: No (comment)  Admission diagnosis:  Encephalopathy acute [G93.40] Acute cystitis without hematuria [N30.00] Patient Active Problem List   Diagnosis Date Noted   Encephalopathy 12/25/2022   Weakness 12/25/2022   UTI (urinary tract infection) 12/25/2022   Dementia with behavioral disturbance 07/09/2021   External hemorrhoid 06/02/2021   Hallucination 06/02/2021   Anxiety 06/02/2021   Rectal discomfort 06/02/2021   Advance directive discussed with patient 01/25/2021   Need for pneumococcal vaccination 01/25/2021   Hyperlipidemia 11/30/2020   Encounter for health maintenance examination in adult 11/30/2020   Medicare annual wellness visit, subsequent 11/30/2020   Alzheimer disease 11/30/2020   Abnormal albumin 07/07/2020   Impaired fasting blood sugar 07/07/2020   Atherosclerosis of coronary artery of native heart without angina pectoris 07/07/2020   Aortic atherosclerosis 07/07/2020   Essential hypertension, benign 04/29/2020   Urinary frequency 04/29/2020   Overactive bladder 04/29/2020   Benign prostatic hyperplasia with urinary frequency 04/29/2020   Screen for colon cancer 04/29/2020   Dementia without behavioral disturbance 04/15/2020   Abdominal discomfort 04/15/2020  Frequent urination 04/15/2020   Constipation 10/15/2019   Bradycardia 07/18/2018   Onychomycosis 08/30/2016   Insomnia 08/03/2015   Generalized anxiety disorder 08/03/2015   Screening for prostate cancer 08/03/2015   Need for influenza vaccination 08/03/2015    Vaccine counseling 08/03/2015   Second hand tobacco smoke exposure 07/31/2014   Dyslipidemia 07/31/2014   PCP:  System, Provider Not In Pharmacy:   [facility], [place] - [name] [place] [address] [name] [name] [place] [address] Phone: [phone] Fax: [phone]     Social Determinants of Health (SDOH) Social History: SDOH Screenings   Depression (PHQ2-9): Low Risk  (06/02/2021)  Tobacco Use: Medium Risk (12/13/2022)   SDOH Interventions:     Readmission Risk Interventions     No data to display

## 2022-12-26 NOTE — Evaluation (Signed)
Occupational Therapy Evaluation Patient Details Name: Paul Klein MRN: [id number] DOB: 01/25/1956 Today's Date: 12/26/2022   History of Present Illness 67 yo male presenting to [facility] ED on 3/31 with weakness, UTI being treated PTA, possible HF. Recent ED admission 3/19 for falls, d/c same day. PMH includes alzheimer's, anxiety.   Clinical Impression   Patient admitted for above and presents with problem list below. Pt unable to provide PLOF. Patient currently requires total assist +2 for bed mobility, attempted standing at EOB and max to total assist for ADLs. He has a hx of Alzheimers, but today not oriented and following very minimal functional commands (washing face).  He will benefit from continued OT services acutely, as tolerated, in order to decrease burden of care.      Recommendations for follow up therapy are one component of a multi-disciplinary discharge planning process, led by the attending physician.  Recommendations may be updated based on patient status, additional functional criteria and insurance authorization.   Assistance Recommended at Discharge Frequent or constant Supervision/Assistance  Patient can return home with the following Two people to help with walking and/or transfers;Two people to help with bathing/dressing/bathroom;Assistance with cooking/housework;Help with stairs or ramp for entrance;Assist for transportation;Direct supervision/assist for financial management;Direct supervision/assist for medications management;Assistance with feeding    Functional Status Assessment  Patient has had a recent decline in their functional status and demonstrates the ability to make significant improvements in function in a reasonable and predictable amount of time.  Equipment Recommendations  Other (comment) (defer)    Recommendations for Other Services       Precautions / Restrictions Precautions Precautions: Fall;Other (comment) Precaution Comments: watch for skin  breakdown (redness to heels) Restrictions Weight Bearing Restrictions: No      Mobility Bed Mobility Overal bed mobility: Needs Assistance Bed Mobility: Supine to Sit, Sit to Supine     Supine to sit: Total assist, +2 for physical assistance Sit to supine: Total assist, +2 for physical assistance   General bed mobility comments: assist for all aspects, helicopter method utilized for to/from EOB.    Transfers Overall transfer level: Needs assistance Equipment used: 2 person hand held assist Transfers: Sit to/from Stand Sit to Stand: Total assist, +2 physical assistance           General transfer comment: unable to come to full standing, total +2 to clear hips with bed pad. x2 attempts, pt appears limited by severe L knee pain      Balance Overall balance assessment: Needs assistance, History of Falls Sitting-balance support: No upper extremity supported, Feet supported Sitting balance-Leahy Scale: Fair Sitting balance - Comments: able to sit EOB unsupported, prefers HHA from therapist for comfort Postural control: Posterior lean (dynamically) Standing balance support: Bilateral upper extremity supported, During functional activity Standing balance-Leahy Scale: Zero Standing balance comment: unable to stand with total +2                           ADL either performed or assessed with clinical judgement   ADL Overall ADL's : Needs assistance/impaired     Grooming: Sitting;Maximal assistance           Upper Body Dressing : Total assistance;Sitting   Lower Body Dressing: Total assistance;+2 for physical assistance;+2 for safety/equipment;Sitting/lateral leans;Bed level     Toilet Transfer Details (indicate cue type and reason): unable         Functional mobility during ADLs: Total assistance;+2 for physical assistance;+2 for safety/equipment  Vision   Additional Comments: forward gaze, scans to locate therapist but limited assessment due  to cognition/alertness     Perception     Praxis      Pertinent Vitals/Pain Pain Assessment Pain Assessment: Faces Faces Pain Scale: Hurts even more Pain Location: L knee Pain Descriptors / Indicators: Grimacing, Moaning, Guarding Pain Intervention(s): Limited activity within patient's tolerance, Monitored during session, Repositioned     Hand Dominance Right   Extremity/Trunk Assessment Upper Extremity Assessment Upper Extremity Assessment: Generalized weakness;Difficult to assess due to impaired cognition (R UE shoulder stiffness, but able to raise BUEs to shoulder level when washing face)   Lower Extremity Assessment Lower Extremity Assessment: Defer to PT evaluation LLE Deficits / Details: assessment limited by pt-guarding; tolerated PROM hip and knee flexion/extension ~50% ROM LLE: Unable to fully assess due to pain   Cervical / Trunk Assessment Cervical / Trunk Assessment: Kyphotic   Communication Communication Communication: Expressive difficulties;Receptive difficulties   Cognition Arousal/Alertness: Lethargic (especially when supine) Behavior During Therapy: Flat affect, Restless Overall Cognitive Status: History of cognitive impairments - at baseline                                 General Comments: at baseline, pt oriented to self only. Pt A&Ox0 today, does not follow any one-step mobility commands.  He is able to functioanlly bring washcloth to face with increased time, but not following verbal commands.     General Comments  scab on crown of head, bruising to L knee, redness to bilat heels (propped with pillow)    Exercises     Shoulder Instructions      Home Living Family/patient expects to be discharged to:: Assisted living                             Home Equipment: Wheelchair - manual          Prior Functioning/Environment Prior Level of Function : History of Falls (last six months);Patient poor historian/Family not  available             Mobility Comments: per chart review, pt is wheelchair-level. Prior to fall 2 weeks ago, pt propelling self in w/c and more interactive with staff ADLs Comments: unsure of baseline, pt unable to report        OT Problem List: Decreased strength;Decreased activity tolerance;Impaired balance (sitting and/or standing);Pain;Decreased cognition      OT Treatment/Interventions: Self-care/ADL training;DME and/or AE instruction;Therapeutic activities;Balance training;Patient/family education    OT Goals(Current goals can be found in the care plan section) Acute Rehab OT Goals Patient Stated Goal: unable OT Goal Formulation: Patient unable to participate in goal setting Time For Goal Achievement: 01/09/23 Potential to Achieve Goals: Good  OT Frequency: Min 2X/week    Co-evaluation PT/OT/SLP Co-Evaluation/Treatment: Yes Reason for Co-Treatment: For patient/therapist safety;To address functional/ADL transfers PT goals addressed during session: Mobility/safety with mobility;Balance OT goals addressed during session: ADL's and self-care      AM-PAC OT "6 Clicks" Daily Activity     Outcome Measure Help from another person eating meals?: A Lot Help from another person taking care of personal grooming?: A Lot Help from another person toileting, which includes using toliet, bedpan, or urinal?: Total Help from another person bathing (including washing, rinsing, drying)?: A Lot Help from another person to put on and taking off regular upper body clothing?: A Lot Help from  another person to put on and taking off regular lower body clothing?: Total 6 Click Score: 10   End of Session Nurse Communication: Mobility status  Activity Tolerance: Patient limited by lethargy Patient left: in bed;with call bell/phone within reach;with bed alarm set;with restraints reapplied  OT Visit Diagnosis: Other abnormalities of gait and mobility (R26.89);Muscle weakness (generalized)  (M62.81);Pain;History of falling (Z91.81) Pain - Right/Left: Left Pain - part of body: Knee                Time: [id number] OT Time Calculation (min): 20 min Charges:  OT General Charges $OT Visit: 1 Visit OT Evaluation $OT Eval Moderate Complexity: 1 Mod  Jolaine Artist, OT Acute Rehabilitation Services Office [phone]   Delight Stare 12/26/2022, 12:45 PM

## 2022-12-26 NOTE — Care Management Obs Status (Signed)
[facility] NOTIFICATION   Patient Details  Name: Paul Klein MRN: [id number] Date of Birth: 02/05/1956   Medicare Observation Status Notification Given:  Yes    Geralynn Ochs, LCSW 12/26/2022, 12:02 PM

## 2022-12-27 DIAGNOSIS — G9340 Encephalopathy, unspecified: Secondary | ICD-10-CM | POA: Diagnosis not present

## 2022-12-27 DIAGNOSIS — R531 Weakness: Secondary | ICD-10-CM | POA: Diagnosis not present

## 2022-12-27 DIAGNOSIS — N390 Urinary tract infection, site not specified: Secondary | ICD-10-CM | POA: Diagnosis not present

## 2022-12-27 DIAGNOSIS — S72002A Fracture of unspecified part of neck of left femur, initial encounter for closed fracture: Secondary | ICD-10-CM | POA: Diagnosis not present

## 2022-12-27 LAB — CBC
HCT: 39.1 % (ref 39.0–52.0)
Hemoglobin: 12.6 g/dL — ABNORMAL LOW (ref 13.0–17.0)
MCH: 26.9 pg (ref 26.0–34.0)
MCHC: 32.2 g/dL (ref 30.0–36.0)
MCV: 83.5 fL (ref 80.0–100.0)
Platelets: 359 10*3/uL (ref 150–400)
RBC: 4.68 MIL/uL (ref 4.22–5.81)
RDW: 14.1 % (ref 11.5–15.5)
WBC: 12.4 10*3/uL — ABNORMAL HIGH (ref 4.0–10.5)
nRBC: 0 % (ref 0.0–0.2)

## 2022-12-27 LAB — BASIC METABOLIC PANEL
Anion gap: 10 (ref 5–15)
BUN: 16 mg/dL (ref 8–23)
CO2: 24 mmol/L (ref 22–32)
Calcium: 8.8 mg/dL — ABNORMAL LOW (ref 8.9–10.3)
Chloride: 103 mmol/L (ref 98–111)
Creatinine, Ser: 0.81 mg/dL (ref 0.61–1.24)
GFR, Estimated: >60 mL/min (ref >60)
Glucose, Bld: 133 mg/dL — ABNORMAL HIGH (ref 70–99)
Potassium: 4 mmol/L (ref 3.5–5.1)
Sodium: 137 mmol/L (ref 135–145)

## 2022-12-27 LAB — HIV ANTIBODY (ROUTINE TESTING W REFLEX): HIV Screen 4th Generation wRfx: NONREACTIVE

## 2022-12-27 MED ORDER — DICLOFENAC SODIUM 1 % EX GEL
2.0000 g | Freq: Four times a day (QID) | CUTANEOUS | Status: DC
  Administered 2022-12-27 – 2022-12-29 (×8): 2 g via TOPICAL
  Filled 2022-12-27 (×2): qty 100

## 2022-12-27 NOTE — Progress Notes (Signed)
HD#0 Subjective:   Summary: This is a 67 year old male with a past medical history of Alzheimer's dementia, hyperlipidemia who presents for worsening encephalopathy.  Patient admitted for further evaluation and management of acute multifactorial encephalopathy.  Overnight Events: No overnight events  Patient resting in bed upon my exam.  Unable to follow any commands.  Not able to talk to me.  Objective:  Vital signs in last 24 hours: Vitals:   12/26/22 1956 12/26/22 2320 12/27/22 0404 12/27/22 0906  BP: (!) 150/92 134/78 (!) 153/86 (!) 145/78  Pulse: 100 92 86 85  Resp: 20 18 19 17   Temp: 98.4 F (36.9 C) 98.4 F (36.9 C) 98.2 F (36.8 C) 98.8 F (37.1 C)  TempSrc: Oral Oral Axillary Axillary  SpO2: 95%   95%  Weight:      Height:       Supplemental O2: Room Air SpO2: 95 %   Physical Exam:  Constitutional: In bed, no acute distress HENT: normocephalic atraumatic Eyes: With some drainage appreciated Cardiovascular: regular rate and rhythm, no m/r/g Pulmonary/Chest: normal work of breathing on room air, decreased breath sounds to left lower lung base Abdominal: soft, non-tender, non-distended, normoactive bowel sounds Extremities: Bilateral upper extremities with mittens in place Neurological: Alert and oriented x 1, unable to follow any commands, can wave at family, mumbles words, can spontaneously move all 4 extremities, occasionally scratches his head  Filed Weights   12/25/22 1442  Weight: 73.1 kg     Intake/Output Summary (Last 24 hours) at 12/27/2022 1314 Last data filed at 12/27/2022 1100 Gross per 24 hour  Intake 323.69 ml  Output --  Net 323.69 ml   Net IO Since Admission: -604.64 mL [12/27/22 1314]  Pertinent Labs:    Latest Ref Rng & Units 12/27/2022    5:05 AM 12/26/2022    4:32 AM 12/25/2022    9:47 AM  CBC  WBC 4.0 - 10.5 K/uL 12.4  10.6    Hemoglobin 13.0 - 17.0 g/dL 12.6  12.5  13.3   Hematocrit 39.0 - 52.0 % 39.1  39.1  39.0   Platelets  150 - 400 K/uL 359  375         Latest Ref Rng & Units 12/27/2022    5:05 AM 12/26/2022    4:32 AM 12/25/2022    9:47 AM  CMP  Glucose 70 - 99 mg/dL 133  127    BUN 8 - 23 mg/dL 16  12    Creatinine 0.61 - 1.24 mg/dL 0.81  0.86    Sodium 135 - 145 mmol/L 137  138  136   Potassium 3.5 - 5.1 mmol/L 4.0  4.0  4.0   Chloride 98 - 111 mmol/L 103  100    CO2 22 - 32 mmol/L 24  27    Calcium 8.9 - 10.3 mg/dL 8.8  8.8      Imaging: DG Knee 1-2 Views Left  Result Date: 12/26/2022 CLINICAL DATA:  Left knee pain. EXAM: LEFT KNEE - 1-2 VIEW COMPARISON:  None Available. FINDINGS: No joint effusion. Mild chronic enthesopathic change at the quadriceps insertion on the patella. Mild medial compartment joint space narrowing. No acute fracture or dislocation. Moderate atherosclerotic calcifications. IMPRESSION: Mild medial compartment osteoarthritis. Electronically Signed   By: Yvonne Kendall M.D.   On: 12/26/2022 18:45    Assessment/Plan:   Principal Problem:   Encephalopathy Active Problems:   Weakness   UTI (urinary tract infection)   Alzheimer's disease   Patient  Summary: Paul Klein is a 67 y.o. male with a pertinent history of Alzheimer's dementia, hyperlipidemia who presents for worsening encephalopathy.  Patient admitted for further evaluation and management of acute multifactorial encephalopathy.  #Acute multifactorial encephalopathy #History of Alzheimer's dementia #Generalized weakness On exam today, patient remains unable to follow commands.  Did speak with family, and patient's baseline includes him being able to walk around with no assist, and occasionally mumbles.  Patient is also able to feed himself occasionally.  Patient is not really able to hold a conversation at baseline, but is oriented to self, and can answer simple yes or no questions.  Otherwise patient mumbles to himself.  On my exam today, he is unable to follow commands, unable to hold a conversation with me, unable to  have any purposeful movements.  Interestingly enough, when daughter comes into the room, he wakes up and waves at her.  Patient does not interact with me well.  He is alert and oriented x 1.  At this point, I am unsure why he continues to be encephalopathic.  Leading theory includes current infection versus drug-induced with many centrally acting medications versus progression of Alzheimer's disease.  Patient has had B12 checked, and also has had TSH checked which were in normal limits.  CT showed no structural causes.  Patient remains afebrile, less likely for encephalitis or meningitis.  Will continue to give ceftriaxone and continue to workup inpatient. -Blood cultures today -Follow-up urine culture -Continue ceftriaxone day 2 (total day 7 of antibiotics) -Continue Klonopin 0.5 mg nightly -Avoid other centrally acting medications -PT OT following  #Urinary tract infection Patient unable to verbalize if he is still having symptoms.  Will continue with treatment. -Ceftriaxone day 2 (total day 7 of antibiotics) -Follow cultures  #Left pleural effusion Did speak to family today about this.  Stating his mental status would not allow for him to tolerate a thoracentesis.  Family agreed.  Patient remains hemodynamically stable and not requiring oxygen.  Respirations within normal limits.  I do not anticipate this is providing a significantly high burden on his respirations at this time.  On exam, patient does have decreased lung sounds noted to left lower lung base.  Will evaluate with another chest x-ray to see if this is resolving. -Hold off on thoracentesis -Monitor respiration status -Consider chest x-ray  #Left knee pain Left knee x-ray showed no fractures or abscesses.  There was some osteoarthritis noted. -Voltaren gel  #Goals of care Spoke to family at bedside today.  Patient is a DNR/DNI.  They did understand that this could be his new baseline, but would like to rule out reversible  causes at this point.  They did agree that thoracentesis would not be a good idea, but they would be amenable if he gets better and mental status improves.  They did state that they do not want any heroic measures taken for him if it came to it.   Diet: Normal IVF: None,None VTE: Enoxaparin Code: DNR/DNI PT/OT recs: Patient required total assist TOC: Per facility, patient can come back even if requiring total assist Family Update: Sister, wife, and daughter updated   Dispo: Anticipated discharge to facility in 2 days pending clinical improvement.   [facility] Resident PGY-1 [phone] Please contact the on call pager after 5 pm and on weekends at [phone].

## 2022-12-28 DIAGNOSIS — G9340 Encephalopathy, unspecified: Secondary | ICD-10-CM | POA: Diagnosis not present

## 2022-12-28 DIAGNOSIS — R531 Weakness: Secondary | ICD-10-CM | POA: Diagnosis not present

## 2022-12-28 DIAGNOSIS — G309 Alzheimer's disease, unspecified: Secondary | ICD-10-CM | POA: Diagnosis not present

## 2022-12-28 DIAGNOSIS — N390 Urinary tract infection, site not specified: Secondary | ICD-10-CM | POA: Diagnosis not present

## 2022-12-28 LAB — BASIC METABOLIC PANEL
Anion gap: 11 (ref 5–15)
BUN: 16 mg/dL (ref 8–23)
CO2: 24 mmol/L (ref 22–32)
Calcium: 8.7 mg/dL — ABNORMAL LOW (ref 8.9–10.3)
Chloride: 99 mmol/L (ref 98–111)
Creatinine, Ser: 0.75 mg/dL (ref 0.61–1.24)
GFR, Estimated: >60 mL/min (ref >60)
Glucose, Bld: 219 mg/dL — ABNORMAL HIGH (ref 70–99)
Potassium: 3.7 mmol/L (ref 3.5–5.1)
Sodium: 134 mmol/L — ABNORMAL LOW (ref 135–145)

## 2022-12-28 LAB — CBC WITH DIFFERENTIAL/PLATELET
Abs Immature Granulocytes: 0.05 10*3/uL (ref 0.00–0.07)
Basophils Absolute: 0.1 10*3/uL (ref 0.0–0.1)
Basophils Relative: 1 %
Eosinophils Absolute: 0.2 10*3/uL (ref 0.0–0.5)
Eosinophils Relative: 2 %
HCT: 40.6 % (ref 39.0–52.0)
Hemoglobin: 12.6 g/dL — ABNORMAL LOW (ref 13.0–17.0)
Immature Granulocytes: 0 %
Lymphocytes Relative: 8 %
Lymphs Abs: 1 10*3/uL (ref 0.7–4.0)
MCH: 26.1 pg (ref 26.0–34.0)
MCHC: 31 g/dL (ref 30.0–36.0)
MCV: 84.2 fL (ref 80.0–100.0)
Monocytes Absolute: 0.6 10*3/uL (ref 0.1–1.0)
Monocytes Relative: 5 %
Neutro Abs: 10.2 10*3/uL — ABNORMAL HIGH (ref 1.7–7.7)
Neutrophils Relative %: 84 %
Platelets: 414 10*3/uL — ABNORMAL HIGH (ref 150–400)
RBC: 4.82 MIL/uL (ref 4.22–5.81)
RDW: 13.5 % (ref 11.5–15.5)
WBC: 12.1 10*3/uL — ABNORMAL HIGH (ref 4.0–10.5)
nRBC: 0 % (ref 0.0–0.2)

## 2022-12-28 LAB — MAGNESIUM: Magnesium: 2.2 mg/dL (ref 1.7–2.4)

## 2022-12-28 MED ORDER — ORAL CARE MOUTH RINSE: 15.0000 mL | OROMUCOSAL | Status: DC | PRN

## 2022-12-28 MED ORDER — ORAL CARE MOUTH RINSE
15.0000 mL | OROMUCOSAL | Status: DC
  Administered 2022-12-28 – 2022-12-29 (×2): 15 mL via OROMUCOSAL

## 2022-12-28 NOTE — Plan of Care (Signed)
  Problem: Activity: Goal: Risk for activity intolerance will decrease Outcome: Progressing   Problem: Nutrition: Goal: Adequate nutrition will be maintained Outcome: Progressing   Problem: Safety: Goal: Ability to remain free from injury will improve Outcome: Progressing   

## 2022-12-28 NOTE — Plan of Care (Signed)

## 2022-12-28 NOTE — Progress Notes (Signed)
HD#0 Subjective:   Summary: This is a 67 year old male with a past medical history of Alzheimer's dementia, hyperlipidemia who presents for worsening encephalopathy.  Patient admitted for further evaluation and management of acute multifactorial encephalopathy.  Overnight Events: No overnight events  Patient resting in bed upon my exam.  Mittens in place.  Unable to verbalize much.  States that he is not feeling good.  Objective:  Vital signs in last 24 hours: Vitals:   12/27/22 0404 12/27/22 0906 12/27/22 2338 12/28/22 0353  BP: (!) 153/86 (!) 145/78 (!) 151/88 (!) 151/81  Pulse: 86 85 79 84  Resp: 19 17 18 17   Temp: 98.2 F (36.8 C) 98.8 F (37.1 C) 98 F (36.7 C) 98 F (36.7 C)  TempSrc: Axillary Axillary Axillary Axillary  SpO2:  95%    Weight:      Height:       Supplemental O2: Room Air SpO2: 95 %   Physical Exam:  Constitutional: In bed, no acute distress HENT: normocephalic atraumatic Eyes: With some drainage appreciated Cardiovascular: regular rate and rhythm, no m/r/g Pulmonary/Chest: normal work of breathing on room air, decreased breath sounds to left lower lung base Abdominal: soft, non-tender, non-distended, normoactive bowel sounds Extremities: Bilateral upper extremities with mittens in place Neurological: Alert and oriented x 1.  Able to move right hand to command occasionally.  Otherwise not following commands.  Filed Weights   12/25/22 1442  Weight: 73.1 kg     Intake/Output Summary (Last 24 hours) at 12/28/2022 0713 Last data filed at 12/27/2022 1700 Gross per 24 hour  Intake 194.31 ml  Output --  Net 194.31 ml    Net IO Since Admission: -510.33 mL [12/28/22 0713]  Pertinent Labs:    Latest Ref Rng & Units 12/27/2022    5:05 AM 12/26/2022    4:32 AM 12/25/2022    9:47 AM  CBC  WBC 4.0 - 10.5 K/uL 12.4  10.6    Hemoglobin 13.0 - 17.0 g/dL 12.6  12.5  13.3   Hematocrit 39.0 - 52.0 % 39.1  39.1  39.0   Platelets 150 - 400 K/uL 359  375          Latest Ref Rng & Units 12/27/2022    5:05 AM 12/26/2022    4:32 AM 12/25/2022    9:47 AM  CMP  Glucose 70 - 99 mg/dL 133  127    BUN 8 - 23 mg/dL 16  12    Creatinine 0.61 - 1.24 mg/dL 0.81  0.86    Sodium 135 - 145 mmol/L 137  138  136   Potassium 3.5 - 5.1 mmol/L 4.0  4.0  4.0   Chloride 98 - 111 mmol/L 103  100    CO2 22 - 32 mmol/L 24  27    Calcium 8.9 - 10.3 mg/dL 8.8  8.8      Imaging: No results found.  Assessment/Plan:   Principal Problem:   Encephalopathy Active Problems:   Weakness   UTI (urinary tract infection)   Alzheimer's disease   Patient Summary: Paul Klein is a 66 y.o. male with a pertinent history of Alzheimer's dementia, hyperlipidemia who presents for worsening encephalopathy.  Patient admitted for further evaluation and management of acute multifactorial encephalopathy.  #Acute multifactorial encephalopathy #History of Alzheimer's dementia #Generalized weakness Had complete workup for any reversible causes during hospitalization including CT head, TSH, mag, B12, HIV.  Did not reveal any structural reversible causes.  Did not reveal any  metabolic reversible causes.  Patient was being treated with ceftriaxone for UTI after being treated with Macrobid in the outpatient setting.  Urinary cultures negative.  Holding his centrally acting medications and not improved.  Patient has been on memantine, donepezil, Haldol.  This could be contributing to his rigidity as well as his mental status.  I would have anticipated he would improve off these medications, which he has not.  Blood cultures so far been negative.  Had conversation with family, stating that this may be his new baseline as we have not identified any reversible causes causing his altered mental status.  Family is understanding about this, and this is what they feared as well.  They were very understanding, and appreciated the work that we did.  From medical standpoint, there is not much further  workup that we can do, and he can be safely discharged back to his memory care facility once he is excepted back.  Given how progressive his dementia is, do not think memantine and donepezil with serve any purpose and will discontinue -Discontinue ceftriaxone -Continue to follow blood cultures -Continue to follow urine culture -Continue Klonopin 0.5 mg nightly -Continue to monitor for further neurological decline -PT OT following -Hold centrally acting medications -Discontinue donepezil and memantine from home medications  #Urinary tract infection Urine culture with no growth -Discontinue ceftriaxone  #Left pleural effusion Left pleural effusion causing no respiratory distress.  Patient not requiring oxygen.  Patient remains afebrile.  White count is not increasing. -Will continue to monitor  #Left knee pain Left knee x-ray showed no fractures or abscesses.  There was some osteoarthritis noted. -Voltaren gel  #Goals of care Spoke to family via phone call today and let them know this may be his new baseline.  Family understanding about this.  They would not want any heroic measures.  Patient DNR/DNI.  They also understand that he could be progressing towards end-of-life if he remains this way as he is not eating and drinking on his own.  Diet: Normal IVF: None,None VTE: Enoxaparin Code: DNR/DNI PT/OT recs: Patient required total assist TOC: Per facility, patient can come back even if requiring total assist Family Update: Wife and daughter updated via phone call  Dispo: Anticipated discharge to facility in 1 days pending transition of care team placement  [facility] Resident PGY-1 [phone] Please contact the on call pager after 5 pm and on weekends at [phone].

## 2022-12-29 ENCOUNTER — Other Ambulatory Visit (HOSPITAL_COMMUNITY): Payer: Self-pay

## 2022-12-29 DIAGNOSIS — R531 Weakness: Secondary | ICD-10-CM | POA: Diagnosis not present

## 2022-12-29 DIAGNOSIS — N390 Urinary tract infection, site not specified: Secondary | ICD-10-CM | POA: Diagnosis not present

## 2022-12-29 DIAGNOSIS — G9340 Encephalopathy, unspecified: Secondary | ICD-10-CM | POA: Diagnosis not present

## 2022-12-29 DIAGNOSIS — G309 Alzheimer's disease, unspecified: Secondary | ICD-10-CM | POA: Diagnosis not present

## 2022-12-29 MED ORDER — DICLOFENAC SODIUM 1 % EX GEL: 2.0000 g | Freq: Four times a day (QID) | CUTANEOUS | 0 refills | Status: DC

## 2022-12-29 MED ORDER — ACETAMINOPHEN 650 MG RE SUPP: 650.0000 mg | Freq: Four times a day (QID) | RECTAL | Status: DC

## 2022-12-29 MED ORDER — ACETAMINOPHEN 325 MG PO TABS: 650.0000 mg | ORAL_TABLET | Freq: Four times a day (QID) | ORAL | 0 refills | Status: DC

## 2022-12-29 MED ORDER — ACETAMINOPHEN 325 MG PO TABS
650.0000 mg | ORAL_TABLET | Freq: Four times a day (QID) | ORAL | Status: DC
  Administered 2022-12-29: 650 mg via ORAL
  Filled 2022-12-29: qty 2

## 2022-12-29 MED ORDER — CLONAZEPAM 0.5 MG PO TABS
0.5000 mg | ORAL_TABLET | Freq: Every day | ORAL | 0 refills | Status: DC
  Filled 2022-12-29 (×2): qty 30, 30d supply, fill #0

## 2022-12-29 NOTE — Progress Notes (Signed)
Discharged to SNF after report called to [facility] and IV access removed.

## 2022-12-29 NOTE — TOC Transition Note (Signed)
Transition of Care [facility]) - CM/SW Discharge Note   Patient Details  Name: Paul Klein MRN: [id number] Date of Birth: 01/03/1956  [facility] CM/SW Contact:  Geralynn Ochs, LCSW Phone Number: 12/29/2022, 2:55 PM   Clinical Narrative:   CSW confirmed with [facility] that patient can return today. CSW sent discharge information and confirmed receipt. Transport arranged with [facility] for next available.  Nurse to call report to [phone]    Final next level of care: Memory Care Barriers to Discharge: Barriers Resolved   Patient Goals and CMS Choice CMS Medicare.gov Compare Post Acute Care list provided to:: Patient Represenative (must comment) Choice offered to / list presented to : Spouse  Discharge Placement                Patient chooses bed at:  [facility]) Patient to be transferred to facility by: [place] Name of family member notified: Diane Patient and family notified of of transfer: 12/29/22  Discharge Plan and Services Additional resources added to the After Visit Summary for       Post Acute Care Choice: Nursing Home                               Social Determinants of Health (SDOH) Interventions SDOH Screenings   Depression (PHQ2-9): Low Risk  (06/02/2021)  Tobacco Use: Medium Risk (12/13/2022)     Readmission Risk Interventions     No data to display

## 2022-12-29 NOTE — Discharge Instructions (Signed)
Mr. Paul Klein  It was a pleasure taking care of you at [place] were admitted for altered mental status.  We are discharging back to your facility now. Please follow the following instructions.   1) Regarding your medications, we have discontinued a lot of centrally acting medications.  You can continue taking clonazepam at night.  We have stopped your other medications given that this could have been contributing to your altered mental status.  Regarding your leg pain, we have given you some Voltaren gel.  Use this on your left knee.  2) Follow-up with your primary care physician in about a week  3) Please return back to the emergency department if you develop fevers greater than 100.4 F or persistent pain that is not resolving.  Take care,  Dr. Leigh Aurora, DO

## 2022-12-29 NOTE — Discharge Summary (Addendum)
Name: Paul Klein MRN: [id number] DOB: Aug 27, 1956 67 y.o. PCP: System, Provider Not In  Date of Admission: 12/25/2022  9:11 AM Date of Discharge: 12/29/2022 Attending Physician: Dr. Lottie Mussel  Discharge Diagnosis: Principal Problem:   Encephalopathy Active Problems:   Weakness   UTI (urinary tract infection)   Alzheimer's disease    Discharge Medications: Allergies as of 12/29/2022       Reactions   Prozac [fluoxetine Hcl]    SSRIs in general cause worsened depression and suicidal ideation        Medication List     STOP taking these medications    ARIPiprazole 5 MG tablet Commonly known as: ABILIFY   divalproex 500 MG 24 hr tablet Commonly known as: DEPAKOTE ER   donepezil 5 MG tablet Commonly known as: ARICEPT   doxazosin 2 MG tablet Commonly known as: Cardura   escitalopram 10 MG tablet Commonly known as: Lexapro   Eszopiclone 3 MG Tabs   furosemide 20 MG tablet Commonly known as: LASIX   gabapentin 800 MG tablet Commonly known as: NEURONTIN   haloperidol 0.5 MG tablet Commonly known as: HALDOL   hydrOXYzine 50 MG tablet Commonly known as: ATARAX   memantine 5 MG tablet Commonly known as: NAMENDA   neomycin-bacitracin-polymyxin [phone] ointment   nitrofurantoin (macrocrystal-monohydrate) 100 MG capsule Commonly known as: MACROBID   QUEtiapine 25 MG tablet Commonly known as: SEROquel   rosuvastatin 20 MG tablet Commonly known as: CRESTOR       TAKE these medications    acetaminophen 325 MG tablet Commonly known as: TYLENOL Take 2 tablets (650 mg total) by mouth every 6 (six) hours.   ASPIRIN LOW DOSE 81 MG tablet Generic drug: aspirin EC TAKE 1 TABLET(81 MG) BY MOUTH DAILY What changed: See the new instructions.   atorvastatin 20 MG tablet Commonly known as: LIPITOR Take 20 mg by mouth at bedtime.   clonazePAM 0.5 MG tablet Commonly known as: KlonoPIN Take 1 tablet (0.5 mg total) by mouth at bedtime. What  changed:  when to take this reasons to take this   diclofenac Sodium 1 % Gel Commonly known as: VOLTAREN Apply 2 g topically 4 (four) times daily. Apply to the left knee   Fish Oil 1000 MG Caps Take 2,000 mg by mouth at bedtime.   multivitamin tablet Take 1 tablet by mouth at bedtime.        Disposition and follow-up:   [name] was discharged from [facility] in Stable condition.  At the hospital follow up visit please address:  1.  Follow-up:  a.  Acute encephalopathy/Alzheimer's disease: No reversible cause found during hospitalization.  Likely new baseline.  Did stop donepezil and memantine as well as other centrally acting medications.  Continue to follow patient outpatient to see if patient recovers back to baseline.    b.  Left pleural effusion: During hospitalization, patient found to have left pleural effusion.  Did not evaluate given mental status.  Family also wanted to ensure patient did not get too many invasive procedures.  Patient remained afebrile.  White count stable.  Continue to monitor respiratory status outpatient.   c.  Cystitis: Patient finished course of antibiotics during hospitalization.  Urine culture with no growth.  Blood cultures with no growth.  Continue to follow-up patient for urinary symptoms if patient mental status improved.   d.  Goals of care: Family did make patient DNR/DNI.  Aware this could be patient's new baseline and understanding  about this.  2.  Labs / imaging needed at time of follow-up: CBC  3.  Pending labs/ test needing follow-up: N/A  4.  Medication Changes  Discontinued many centrally acting medications.  See above.  Follow-up Appointments: Follow-up with primary care physician in 1 week.  Hospital Course by problem list: Paul Klein is a 67 y.o. male with a pertinent history of Alzheimer's dementia, hyperlipidemia who presents for worsening encephalopathy.  Patient admitted for further evaluation  and management of acute multifactorial encephalopathy.   #Acute multifactorial encephalopathy #History of Alzheimer's dementia #Generalized weakness Patient initially presented to the emergency department with altered mental status and weakness.  Over the past couple weeks, patient has been slowly declining ever since he had a fall.  Patient also was getting treated for a UTI in the outpatient setting.  On arrival, patient was not communicative, patient was not able to verbalize anything.  Patient was not following commands.  Patient had imaging, showing no structural concerns.  Labs did not point towards any reversible metabolic causes.  Vitamin levels within normal limits.  Thyroid within normal limits.  Did obtain cultures which did not grow any bacteria.  Did initially start ceftriaxone for UTI to complete treatment.  Patient completed treatment, and patient's mental status still did not improve.  Held centrally acting medications see if patient could improve, and did not.  Given no reversible causes were found, and patient had recent insult, I am afraid this could be patient as needed baseline given history of Alzheimer's dementia.  Did communicate this with family, and patient family understood the concerns.  Patient worked with PT/OT which patient required total assist which was moved from previous baseline.  Patient discharged back to his facility with total care.  Discontinue memantine and donepezil given progressive dementia.  Discontinued other centrally acting medication during hospitalization.  Patient can take clonazepam at bedtime.   #Urinary tract infection, resolved Patient completed antibiotic course during hospitalization.  Urinary culture grew no bacteria.  Blood cultures grew no bacteria.   #Left pleural effusion Did speak to family today about this.  Stating his mental status would not allow for him to tolerate a thoracentesis.  Family agreed.  Patient remains hemodynamically stable  and not requiring oxygen.  Patient remained afebrile.  White count did start to trend down.  Did not pursue thoracentesis during hospitalization.   #Left knee pain Left knee x-ray showed no fractures or abscesses. There was some osteoarthritis noted.  Given Voltaren gel and discharge.   #Goals of care Spoke to family at bedside today.  Patient is a DNR/DNI.  They did understand that this could be his new baseline. They did state that they do not want any heroic measures taken for him if it came to it.   Discharge Subjective: Evaluated patient at bedside.  Patient was working with PT/OT.  Patient unable to verbalize anything to me.  He denies any pain.  Discharge Exam:   BP (!) 119/94 (BP Location: Right Arm)   Pulse 88   Temp 97.9 F (36.6 C) (Oral)   Resp 18   Ht 6' (1.829 m)   Wt 73.1 kg   SpO2 96%   BMI 21.86 kg/m  Constitutional: Resting in bed, no acute distress HENT: normocephalic atraumatic Eyes: Unable to track Cardiovascular: regular rate and rhythm, no m/r/g Pulmonary/Chest: normal work of breathing on room air, lungs clear to auscultation bilaterally Extremities: No knee tenderness, hip tenderness, ankle tenderness bilaterally.  Rigidity noted to bilateral  upper and lower extremities. Neuro: Alert and oriented x 1.  Unable to follow commands.  Pertinent Labs, Studies, and Procedures:     Latest Ref Rng & Units 12/28/2022   10:48 AM 12/27/2022    5:05 AM 12/26/2022    4:32 AM  CBC  WBC 4.0 - 10.5 K/uL 12.1  12.4  10.6   Hemoglobin 13.0 - 17.0 g/dL 12.6  12.6  12.5   Hematocrit 39.0 - 52.0 % 40.6  39.1  39.1   Platelets 150 - 400 K/uL 414  359  375        Latest Ref Rng & Units 12/28/2022   10:48 AM 12/27/2022    5:05 AM 12/26/2022    4:32 AM  CMP  Glucose 70 - 99 mg/dL 219  133  127   BUN 8 - 23 mg/dL 16  16  12    Creatinine 0.61 - 1.24 mg/dL 0.75  0.81  0.86   Sodium 135 - 145 mmol/L 134  137  138   Potassium 3.5 - 5.1 mmol/L 3.7  4.0  4.0   Chloride 98 - 111  mmol/L 99  103  100   CO2 22 - 32 mmol/L 24  24  27    Calcium 8.9 - 10.3 mg/dL 8.7  8.8  8.8     DG Knee 1-2 Views Left  Result Date: 12/26/2022 CLINICAL DATA:  Left knee pain. EXAM: LEFT KNEE - 1-2 VIEW COMPARISON:  None Available. FINDINGS: No joint effusion. Mild chronic enthesopathic change at the quadriceps insertion on the patella. Mild medial compartment joint space narrowing. No acute fracture or dislocation. Moderate atherosclerotic calcifications. IMPRESSION: Mild medial compartment osteoarthritis. Electronically Signed   By: Yvonne Kendall M.D.   On: 12/26/2022 18:45   CT Head Wo Contrast  Result Date: 12/25/2022 CLINICAL DATA:  Mental status change of unknown etiology. EXAM: CT HEAD WITHOUT CONTRAST TECHNIQUE: Contiguous axial images were obtained from the base of the skull through the vertex without intravenous contrast. RADIATION DOSE REDUCTION: This exam was performed according to the departmental dose-optimization program which includes automated exposure control, adjustment of the mA and/or kV according to patient size and/or use of iterative reconstruction technique. COMPARISON:  12/13/2022 FINDINGS: Brain: No evidence of acute infarction, hemorrhage, hydrocephalus, extra-axial collection or mass lesion/mass effect. There is mild diffuse low-attenuation within the subcortical and periventricular white matter compatible with chronic microvascular disease. Prominence of sulci and ventricles compatible with brain atrophy Vascular: No hyperdense vessel or unexpected calcification. Skull: Normal. Negative for fracture or focal lesion. Sinuses/Orbits: Paranasal sinuses and the mastoid air cells are clear. Other: None IMPRESSION: 1. No acute intracranial abnormalities. 2. Chronic microvascular disease and brain atrophy. Electronically Signed   By: Kerby Moors M.D.   On: 12/25/2022 10:10   DG Chest Portable 1 View  Result Date: 12/25/2022 CLINICAL DATA:  Weakness. EXAM: PORTABLE CHEST 1  VIEW COMPARISON:  07/15/2021 FINDINGS: Stable cardiomediastinal contours. Pulmonary vascular congestion. New left pleural effusion. Decreased aeration to the left base may reflect atelectasis and or airspace disease. Visualized osseous structures are unremarkable. IMPRESSION: 1. New left pleural effusion with decreased aeration to the left base. 2. Pulmonary vascular congestion. Electronically Signed   By: Kerby Moors M.D.   On: 12/25/2022 09:43     Discharge Instructions: Discharge Instructions     Call MD for:  difficulty breathing, headache or visual disturbances   Complete by: As directed    Call MD for:  persistant nausea and vomiting   Complete  by: As directed    Call MD for:  redness, tenderness, or signs of infection (pain, swelling, redness, odor or green/yellow discharge around incision site)   Complete by: As directed    Call MD for:  temperature >100.4   Complete by: As directed    Diet - low sodium heart healthy   Complete by: As directed    Increase activity slowly   Complete by: As directed       Mr. Paul Klein  It was a pleasure taking care of you at [place] were admitted for altered mental status.  We are discharging back to your facility now. Please follow the following instructions.   1) Regarding your medications, we have discontinued a lot of centrally acting medications.  You can continue taking clonazepam at night.  We have stopped your other medications given that this could have been contributing to your altered mental status.  Regarding your leg pain, we have given you some Voltaren gel.  Use this on your left knee.  2) Follow-up with your primary care physician in about a week  3) Please return back to the emergency department if you develop fevers greater than 100.4 F or persistent pain that is not resolving.  Take care,  Dr. Leigh Aurora, DO   Signed: Leigh Aurora, DO 12/29/2022, 1:15 PM   Pager: [phone]

## 2022-12-29 NOTE — NC FL2 (Signed)
[place] LEVEL OF CARE FORM     IDENTIFICATION  Patient Name: Paul Klein Birthdate: [birth year] Sex: male Admission Date (Current Location): 12/25/2022  [facility] and [place] Number:  Herbalist and Address:  [facility], [place] [address], [place], [place] [address]      Provider Number: [id number]  Attending Physician Name and Address:  Lottie Mussel, MD  Relative Name and Phone Number:       Current Level of Care: Hospital Recommended Level of Care: Memory Care Prior Approval Number:    Date Approved/Denied:   PASRR Number:    Discharge Plan: Other (Comment) (Memory Care)    Current Diagnoses: Patient Active Problem List   Diagnosis Date Noted   Alzheimer's disease 12/26/2022   Encephalopathy 12/25/2022   Weakness 12/25/2022   UTI (urinary tract infection) 12/25/2022   Dementia with behavioral disturbance 07/09/2021   External hemorrhoid 06/02/2021   Hallucination 06/02/2021   Anxiety 06/02/2021   Rectal discomfort 06/02/2021   Advance directive discussed with patient 01/25/2021   Need for pneumococcal vaccination 01/25/2021   Hyperlipidemia 11/30/2020   Encounter for health maintenance examination in adult 11/30/2020   Medicare annual wellness visit, subsequent 11/30/2020   Alzheimer disease 11/30/2020   Abnormal albumin 07/07/2020   Impaired fasting blood sugar 07/07/2020   Atherosclerosis of coronary artery of native heart without angina pectoris 07/07/2020   Aortic atherosclerosis 07/07/2020   Essential hypertension, benign 04/29/2020   Urinary frequency 04/29/2020   Overactive bladder 04/29/2020   Benign prostatic hyperplasia with urinary frequency 04/29/2020   Screen for colon cancer 04/29/2020   Dementia without behavioral disturbance 04/15/2020   Abdominal discomfort 04/15/2020   Frequent urination 04/15/2020   Constipation 10/15/2019   Bradycardia 07/18/2018   Onychomycosis 08/30/2016   Insomnia  08/03/2015   Generalized anxiety disorder 08/03/2015   Screening for prostate cancer 08/03/2015   Need for influenza vaccination 08/03/2015   Vaccine counseling 08/03/2015   Second hand tobacco smoke exposure 07/31/2014   Dyslipidemia 07/31/2014    Orientation RESPIRATION BLADDER Height & Weight     Self  Normal Incontinent Weight: 161 lb 2.5 oz (73.1 kg) Height:  6' (182.9 cm)  BEHAVIORAL SYMPTOMS/MOOD NEUROLOGICAL BOWEL NUTRITION STATUS      Incontinent Diet (regular)  AMBULATORY STATUS COMMUNICATION OF NEEDS Skin   Extensive Assist Verbally Skin abrasions                       Personal Care Assistance Level of Assistance  Bathing, Feeding, Dressing Bathing Assistance: Maximum assistance Feeding assistance: Maximum assistance Dressing Assistance: Maximum assistance     Functional Limitations Info  Sight, Hearing Sight Info: Impaired Hearing Info: Impaired      SPECIAL CARE FACTORS FREQUENCY                       Contractures Contractures Info: Not present    Additional Factors Info  Code Status, Allergies, Psychotropic Code Status Info: DNR Allergies Info: Prozac Psychotropic Info: Klonopin 0.5 mg daily at bed         Current Medications (12/29/2022):  This is the current hospital active medication list Current Facility-Administered Medications  Medication Dose Route Frequency Provider Last Rate Last Admin   acetaminophen (TYLENOL) tablet 650 mg  650 mg Oral Q6H Iona Coach, MD   650 mg at 12/29/22 1100   Or   acetaminophen (TYLENOL) suppository 650 mg  650 mg Rectal Q6H  Iona Coach, MD       aspirin EC tablet 81 mg  81 mg Oral QHS Rick Duff, MD   81 mg at 12/28/22 2149   clonazePAM (KLONOPIN) tablet 0.5 mg  0.5 mg Oral QHS Rick Duff, MD   0.5 mg at 12/28/22 2149   diclofenac Sodium (VOLTAREN) 1 % topical gel 2 g  2 g Topical QID Leigh Aurora, DO   2 g at 12/29/22 1100   enoxaparin (LOVENOX) injection 40 mg  40 mg Subcutaneous  Q24H Rick Duff, MD   40 mg at 12/28/22 1300   ondansetron (ZOFRAN) tablet 4 mg  4 mg Oral Q6H PRN Rick Duff, MD       Or   ondansetron [facility]) injection 4 mg  4 mg Intravenous Q6H PRN Rick Duff, MD       Oral care mouth rinse  15 mL Mouth Rinse 4 times per day Lottie Mussel, MD   15 mL at 12/29/22 0800   Oral care mouth rinse  15 mL Mouth Rinse PRN Lottie Mussel, MD         Discharge Medications: STOP taking these medications     ARIPiprazole 5 MG tablet Commonly known as: ABILIFY    divalproex 500 MG 24 hr tablet Commonly known as: DEPAKOTE ER    donepezil 5 MG tablet Commonly known as: ARICEPT    doxazosin 2 MG tablet Commonly known as: Cardura    escitalopram 10 MG tablet Commonly known as: Lexapro    Eszopiclone 3 MG Tabs    furosemide 20 MG tablet Commonly known as: LASIX    gabapentin 800 MG tablet Commonly known as: NEURONTIN    haloperidol 0.5 MG tablet Commonly known as: HALDOL    hydrOXYzine 50 MG tablet Commonly known as: ATARAX    memantine 5 MG tablet Commonly known as: NAMENDA    neomycin-bacitracin-polymyxin [id number] ointment    nitrofurantoin (macrocrystal-monohydrate) 100 MG capsule Commonly known as: MACROBID    QUEtiapine 25 MG tablet Commonly known as: SEROquel    rosuvastatin 20 MG tablet Commonly known as: CRESTOR           TAKE these medications     acetaminophen 325 MG tablet Commonly known as: TYLENOL Take 2 tablets (650 mg total) by mouth every 6 (six) hours.    ASPIRIN LOW DOSE 81 MG tablet Generic drug: aspirin EC TAKE 1 TABLET(81 MG) BY MOUTH DAILY What changed: See the new instructions.    atorvastatin 20 MG tablet Commonly known as: LIPITOR Take 20 mg by mouth at bedtime.    clonazePAM 0.5 MG tablet Commonly known as: KlonoPIN Take 1 tablet (0.5 mg total) by mouth at bedtime. What changed:  when to take this reasons to take this    diclofenac Sodium 1 % Gel Commonly known as:  VOLTAREN Apply 2 g topically 4 (four) times daily. Apply to the left knee    Fish Oil 1000 MG Caps Take 2,000 mg by mouth at bedtime.    multivitamin tablet Take 1 tablet by mouth at bedtime.    Relevant Imaging Results:  Relevant Lab Results:   Additional Information SS#: [id number]  Geralynn Ochs, LCSW

## 2022-12-29 NOTE — Progress Notes (Signed)
Occupational Therapy Treatment Patient Details Name: Paul Klein MRN: [id number] DOB: 07/12/1956 Today's Date: 12/29/2022   History of present illness 67 yo male presenting to [facility] ED on 3/31 with weakness, UTI being treated PTA, possible HF. Recent ED admission 3/19 for falls, d/c same day. PMH includes alzheimer's, anxiety.   OT comments  Pt progressing slowly towards OT goals.  Pt following some simple commands, but requires frequent redirection and repetition due to poor attention and recall (baseline cognitive deficits).  He requires total assist with hand over hand to wash face supine, but when sitting EOB able to complete with support for balance only.  He requires total assist for bed mobility, sitting balance total assist fading to min A/min guard.  L LE remains painful, pt unable to report but off weighting hip throughout session.  Will follow acutely.    Recommendations for follow up therapy are one component of a multi-disciplinary discharge planning process, led by the attending physician.  Recommendations may be updated based on patient status, additional functional criteria and insurance authorization.    Assistance Recommended at Discharge Frequent or constant Supervision/Assistance  Patient can return home with the following  Two people to help with walking and/or transfers;Two people to help with bathing/dressing/bathroom;Assistance with cooking/housework;Help with stairs or ramp for entrance;Assist for transportation;Direct supervision/assist for financial management;Direct supervision/assist for medications management;Assistance with feeding   Equipment Recommendations  Other (comment) (defer)    Recommendations for Other Services      Precautions / Restrictions Precautions Precautions: Fall;Other (comment) Precaution Comments: watch for skin breakdown (redness to heels); bil mittens Restrictions Weight Bearing Restrictions: No       Mobility Bed Mobility Overal bed  mobility: Needs Assistance Bed Mobility: Rolling, Supine to Sit, Sit to Supine Rolling: Total assist   Supine to sit: Total assist, +2 for physical assistance, +2 for safety/equipment, HOB elevated Sit to supine: Total assist, +2 for physical assistance, +2 for safety/equipment, HOB elevated   General bed mobility comments: Pt not initiating bed mobility, needing TA to roll to R with guidance to grab R bed rail with L hand and TAx2 via helicopter method to transition supine <> sit L EOB.    Transfers Overall transfer level: Needs assistance Equipment used: Ambulation equipment used Transfers: Sit to/from Stand Sit to Stand: Total assist, +2 physical assistance, +2 safety/equipment, From elevated surface           General transfer comment: Attempted x2 reps from elevated EOB using the stedy with pt needing hand over hand assistance to keep his grasp on the bar. Successful with the second attempt. Pt able to stand for ~15 sec with total assist x2, maintaining a flexed posture and R lateral bias. Bed pad used as a sling under his buttocks to facilitate him to stand.     Balance Overall balance assessment: Needs assistance, History of Falls Sitting-balance support: Single extremity supported, Bilateral upper extremity supported, No upper extremity supported, Feet supported Sitting balance-Leahy Scale: Poor Sitting balance - Comments: Pt initially with a strong posterior and R lateral bias, apparently due to L hip pain. Pt needing max-TA initially to prevent LOB and needed assistance to place hands on lap, on stedy, or in hands of therapist anterior to him to prevent him from pushing with his L UE. Pt eventually progressed to min guard assist to sit statically EOB for several minutes before returning to supine Postural control: Posterior lean, Right lateral lean Standing balance support: Bilateral upper extremity supported, During functional activity  Standing balance-Leahy Scale:  Zero Standing balance comment: Total assist x2 to stand for ~15 sec in stedy with flexed posture and R lateral bias                           ADL either performed or assessed with clinical judgement   ADL Overall ADL's : Needs assistance/impaired     Grooming: Wash/dry face;Minimal assistance Grooming Details (indicate cue type and reason): total assist to wash face in supine but when handed wash cloth in sitting able to complete with min assist using RUE         Upper Body Dressing : Total assistance;Sitting   Lower Body Dressing: Total assistance;+2 for safety/equipment;+2 for physical assistance;Sit to/from stand;Sitting/lateral leans;Bed level     Toilet Transfer Details (indicate cue type and reason): unable         Functional mobility during ADLs: Total assistance;+2 for physical assistance;+2 for safety/equipment      Extremity/Trunk Assessment              Vision       Perception     Praxis      Cognition Arousal/Alertness: Awake/alert Behavior During Therapy: Flat affect Overall Cognitive Status: History of cognitive impairments - at baseline                                 General Comments: at baseline, pt oriented to self only. Pt  follows up to ~10% of simple multi-modal commands, but displays poor attention span needing repeated cues and stimulation to attend to brief command. Poor awareness of safety, pushing himself posteriorly and to the R sitting EOB        Exercises      Shoulder Instructions       General Comments see PT note for details on L hip discomfort    Pertinent Vitals/ Pain       Pain Assessment Pain Assessment: Faces Faces Pain Scale: Hurts whole lot Pain Location: L hip Pain Descriptors / Indicators: Discomfort, Grimacing, Guarding, Moaning Pain Intervention(s): Limited activity within patient's tolerance, Monitored during session, Repositioned  Home Living                                           Prior Functioning/Environment              Frequency  Min 2X/week        Progress Toward Goals  OT Goals(current goals can now be found in the care plan section)  Progress towards OT goals: Progressing toward goals  Acute Rehab OT Goals Patient Stated Goal: none stated OT Goal Formulation: Patient unable to participate in goal setting Time For Goal Achievement: 01/09/23 Potential to Achieve Goals: Good  Plan Discharge plan remains appropriate;Frequency remains appropriate    Co-evaluation    PT/OT/SLP Co-Evaluation/Treatment: Yes Reason for Co-Treatment: Necessary to address cognition/behavior during functional activity;For patient/therapist safety;To address functional/ADL transfers PT goals addressed during session: Mobility/safety with mobility;Balance;Proper use of DME OT goals addressed during session: ADL's and self-care      AM-PAC OT "6 Clicks" Daily Activity     Outcome Measure   Help from another person eating meals?: A Lot Help from another person taking care of personal grooming?: A Lot Help from another person toileting, which  includes using toliet, bedpan, or urinal?: Total Help from another person bathing (including washing, rinsing, drying)?: A Lot Help from another person to put on and taking off regular upper body clothing?: A Lot Help from another person to put on and taking off regular lower body clothing?: Total 6 Click Score: 10    End of Session    OT Visit Diagnosis: Other abnormalities of gait and mobility (R26.89);Muscle weakness (generalized) (M62.81);Pain;History of falling (Z91.81) Pain - Right/Left: Left Pain - part of body: Knee   Activity Tolerance Patient tolerated treatment well   Patient Left in bed;with call bell/phone within reach;with bed alarm set;with restraints reapplied   Nurse Communication Mobility status        Time: [id number] OT Time Calculation (min): 31 min  Charges: OT General  Charges $OT Visit: 1 Visit OT Treatments $Self Care/Home Management : 8-22 mins  Jolaine Artist, [place] Office [phone]   Delight Stare 12/29/2022, 10:11 AM

## 2022-12-29 NOTE — Progress Notes (Signed)
Physical Therapy Treatment Patient Details Name: Paul Klein MRN: [id number] DOB: 1956-09-02 Today's Date: 12/29/2022   History of Present Illness 67 yo male presenting to [facility] ED on 3/31 with weakness, UTI being treated PTA, possible HF. Recent ED admission 3/19 for falls, d/c same day. PMH includes alzheimer's, anxiety.    PT Comments    Pt is making slow, gradual progress with functional mobility as he was able to successfully stand 1x ~15 seconds with total assist x2 using the stedy today. However, pt demonstrates a R lateral and posterior bias in sitting and standing, apparently to try to avoid weight bearing due to pain in his L hip. Noted no pain/grimace/guarding by pt when isolating ROM and approximation of the L knee and ankle joint but noted pain/grimacing/guarding with approximation and ROM of his L hip joint. Noted pt preference for L hip external rotation along with pain with deep palpation at L gluteus maximus muscle and anterior superior thigh with edema noted at anterior superior thigh. Notified MD and RN of these findings. Pt was eventually able to progress from needing max-total assist for sitting balance to min guard assist the longer he sat on the EOB. Pt remains limited by his L hip pain and his poor attention span and ability to follow commands, only following up to ~10% of commands. Will continue to follow acutely.    Recommendations for follow up therapy are one component of a multi-disciplinary discharge planning process, led by the attending physician.  Recommendations may be updated based on patient status, additional functional criteria and insurance authorization.  Follow Up Recommendations       Assistance Recommended at Discharge Frequent or constant Supervision/Assistance  Patient can return home with the following Two people to help with walking and/or transfers;Two people to help with bathing/dressing/bathroom;Direct supervision/assist for financial  management;Direct supervision/assist for medications management;Assist for transportation   Equipment Recommendations  None recommended by PT    Recommendations for Other Services       Precautions / Restrictions Precautions Precautions: Fall;Other (comment) Precaution Comments: watch for skin breakdown (redness to heels); bil mittens Restrictions Weight Bearing Restrictions: No     Mobility  Bed Mobility Overal bed mobility: Needs Assistance Bed Mobility: Rolling, Supine to Sit, Sit to Supine Rolling: Total assist   Supine to sit: Total assist, +2 for physical assistance, +2 for safety/equipment, HOB elevated Sit to supine: Total assist, +2 for physical assistance, +2 for safety/equipment, HOB elevated   General bed mobility comments: Pt not initiating bed mobility, needing TA to roll to R with guidance to grab R bed rail with L hand and TAx2 via helicopter method to transition supine <> sit L EOB.    Transfers Overall transfer level: Needs assistance Equipment used: Ambulation equipment used Transfers: Sit to/from Stand Sit to Stand: Total assist, +2 physical assistance, +2 safety/equipment, From elevated surface           General transfer comment: Attempted x2 reps from elevated EOB using the stedy with pt needing hand over hand assistance to keep his grasp on the bar. Successful with the second attempt. Pt able to stand for ~15 sec with total assist x2, maintaining a flexed posture and R lateral bias. Bed pad used as a sling under his buttocks to facilitate him to stand.    Ambulation/Gait               General Gait Details: unable   Stairs  Wheelchair Mobility    Modified Rankin (Stroke Patients Only)       Balance Overall balance assessment: Needs assistance, History of Falls Sitting-balance support: Single extremity supported, Bilateral upper extremity supported, No upper extremity supported, Feet supported Sitting balance-Leahy  Scale: Poor Sitting balance - Comments: Pt initially with a strong posterior and R lateral bias, apparently due to L hip pain. Pt needing max-TA initially to prevent LOB and needed assistance to place hands on lap, on stedy, or in hands of therapist anterior to him to prevent him from pushing with his L UE. Pt eventually progressed to min guard assist to sit statically EOB for several minutes before returning to supine Postural control: Posterior lean, Right lateral lean Standing balance support: Bilateral upper extremity supported, During functional activity Standing balance-Leahy Scale: Zero Standing balance comment: Total assist x2 to stand for ~15 sec in stedy with flexed posture and R lateral bias                            Cognition Arousal/Alertness: Awake/alert Behavior During Therapy: Flat affect Overall Cognitive Status: History of cognitive impairments - at baseline                                 General Comments: at baseline, pt oriented to self only. Pt  follows up to ~10% of simple multi-modal commands, but displays poor attention span needing repeated cues and stimulation to attend to brief command. Poor awareness of safety, pushing himself posteriorly and to the R sitting EOB        Exercises      General Comments General comments (skin integrity, edema, etc.): noted no pain/grimace/guarding by pt when isolating ROM and approximation of the L knee and ankle joint but noted pain/grimacing/guarding with approximation and ROM of his L hip joint, noted pt preference for L hip external rotation and avoidance of weight bearing in sitting and standing along with pain with deep palpation at L gluteus maximus muscle and anterior superior thigh with edema noted at anterior superior thigh - notified MD and RN      Pertinent Vitals/Pain Pain Assessment Pain Assessment: Faces Faces Pain Scale: Hurts whole lot Pain Location: L hip Pain Descriptors /  Indicators: Discomfort, Grimacing, Guarding, Moaning Pain Intervention(s): Limited activity within patient's tolerance, Monitored during session, Repositioned    Home Living                          Prior Function            PT Goals (current goals can now be found in the care plan section) Acute Rehab PT Goals Patient Stated Goal: did not state, but appeared to desire to reduce pain PT Goal Formulation: Patient unable to participate in goal setting Time For Goal Achievement: 01/09/23 Potential to Achieve Goals: Fair Progress towards PT goals: Progressing toward goals (slowly)    Frequency    Min 2X/week      PT Plan Current plan remains appropriate    Co-evaluation PT/OT/SLP Co-Evaluation/Treatment: Yes Reason for Co-Treatment: Necessary to address cognition/behavior during functional activity;For patient/therapist safety;To address functional/ADL transfers PT goals addressed during session: Mobility/safety with mobility;Balance;Proper use of DME        AM-PAC PT "6 Clicks" Mobility   Outcome Measure  Help needed turning from your back to your side while in  a flat bed without using bedrails?: Total Help needed moving from lying on your back to sitting on the side of a flat bed without using bedrails?: Total Help needed moving to and from a bed to a chair (including a wheelchair)?: Total Help needed standing up from a chair using your arms (e.g., wheelchair or bedside chair)?: Total Help needed to walk in hospital room?: Total Help needed climbing 3-5 steps with a railing? : Total 6 Click Score: 6    End of Session   Activity Tolerance: Patient limited by pain;Other (comment) (limited by impaired cognition) Patient left: in bed;with call bell/phone within reach;with bed alarm set;with restraints reapplied Nurse Communication: Other (comment) (pain at hip) PT Visit Diagnosis: Other abnormalities of gait and mobility (R26.89);Unsteadiness on feet  (R26.81);Pain Pain - Right/Left: Left Pain - part of body: Hip     Time: [id number] PT Time Calculation (min) (ACUTE ONLY): 31 min  Charges:  $Therapeutic Activity: 8-22 mins                     Moishe Spice, PT, DPT Acute Rehabilitation Services  Office: [phone]    Orvan Falconer 12/29/2022, 9:55 AM

## 2022-12-29 NOTE — Plan of Care (Signed)

## 2022-12-29 NOTE — TOC Progression Note (Signed)
Transition of Care [facility]) - Progression Note    Patient Details  Name: Paul Klein MRN: [id number] Date of Birth: January 14, 1956  Transition of Care [facility]) CM/SW [place], [place] Phone Number: 12/29/2022, 2:53 PM  Clinical Narrative:   CSW contacted by MD about patient returning to [facility]. CSW spoke with nurse and they have lost power, are unable to accept patient's discharge information at this time, requesting return tomorrow if possible. CSW updated MD, will follow.    Expected Discharge Plan: Memory Care Barriers to Discharge: Continued Medical Work up  Expected Discharge Plan and Services     Post Acute Care Choice: Nursing Home Living arrangements for the past 2 months: [place] Expected Discharge Date: 12/29/22                                     Social Determinants of Health (SDOH) Interventions SDOH Screenings   Depression (PHQ2-9): Low Risk  (06/02/2021)  Tobacco Use: Medium Risk (12/13/2022)    Readmission Risk Interventions     No data to display

## 2022-12-30 ENCOUNTER — Inpatient Hospital Stay (HOSPITAL_COMMUNITY)
Admission: EM | Admit: 2022-12-30 | Discharge: 2023-01-06 | DRG: 522 | Disposition: A | Discharge status: Home Health | Payer: Medicare HMO | Source: Skilled Nursing Facility | Attending: Internal Medicine | Admitting: Internal Medicine

## 2022-12-30 ENCOUNTER — Emergency Department (HOSPITAL_COMMUNITY): Payer: Medicare HMO

## 2022-12-30 ENCOUNTER — Encounter (HOSPITAL_COMMUNITY): Payer: Self-pay

## 2022-12-30 DIAGNOSIS — S72002A Fracture of unspecified part of neck of left femur, initial encounter for closed fracture: Principal | ICD-10-CM

## 2022-12-30 DIAGNOSIS — Y92009 Unspecified place in unspecified non-institutional (private) residence as the place of occurrence of the external cause: Secondary | ICD-10-CM

## 2022-12-30 DIAGNOSIS — D509 Iron deficiency anemia, unspecified: Secondary | ICD-10-CM | POA: Diagnosis present

## 2022-12-30 DIAGNOSIS — J90 Pleural effusion, not elsewhere classified: Secondary | ICD-10-CM | POA: Diagnosis present

## 2022-12-30 DIAGNOSIS — Z823 Family history of stroke: Secondary | ICD-10-CM

## 2022-12-30 DIAGNOSIS — Z87891 Personal history of nicotine dependence: Secondary | ICD-10-CM

## 2022-12-30 DIAGNOSIS — Z7982 Long term (current) use of aspirin: Secondary | ICD-10-CM | POA: Diagnosis not present

## 2022-12-30 DIAGNOSIS — Z79899 Other long term (current) drug therapy: Secondary | ICD-10-CM

## 2022-12-30 DIAGNOSIS — Z751 Person awaiting admission to adequate facility elsewhere: Secondary | ICD-10-CM

## 2022-12-30 DIAGNOSIS — Z818 Family history of other mental and behavioral disorders: Secondary | ICD-10-CM

## 2022-12-30 DIAGNOSIS — Z66 Do not resuscitate: Secondary | ICD-10-CM | POA: Diagnosis present

## 2022-12-30 DIAGNOSIS — Z888 Allergy status to other drugs, medicaments and biological substances status: Secondary | ICD-10-CM

## 2022-12-30 DIAGNOSIS — G4700 Insomnia, unspecified: Secondary | ICD-10-CM | POA: Diagnosis present

## 2022-12-30 DIAGNOSIS — Z8249 Family history of ischemic heart disease and other diseases of the circulatory system: Secondary | ICD-10-CM

## 2022-12-30 DIAGNOSIS — D62 Acute posthemorrhagic anemia: Secondary | ICD-10-CM | POA: Diagnosis not present

## 2022-12-30 DIAGNOSIS — F0280 Dementia in other diseases classified elsewhere without behavioral disturbance: Secondary | ICD-10-CM | POA: Diagnosis present

## 2022-12-30 DIAGNOSIS — I2510 Atherosclerotic heart disease of native coronary artery without angina pectoris: Secondary | ICD-10-CM | POA: Diagnosis not present

## 2022-12-30 DIAGNOSIS — R296 Repeated falls: Secondary | ICD-10-CM | POA: Diagnosis present

## 2022-12-30 DIAGNOSIS — F410 Panic disorder [episodic paroxysmal anxiety] without agoraphobia: Secondary | ICD-10-CM | POA: Diagnosis present

## 2022-12-30 DIAGNOSIS — S7292XA Unspecified fracture of left femur, initial encounter for closed fracture: Secondary | ICD-10-CM

## 2022-12-30 DIAGNOSIS — W19XXXA Unspecified fall, initial encounter: Secondary | ICD-10-CM | POA: Diagnosis present

## 2022-12-30 DIAGNOSIS — R1310 Dysphagia, unspecified: Secondary | ICD-10-CM | POA: Diagnosis present

## 2022-12-30 DIAGNOSIS — G309 Alzheimer's disease, unspecified: Secondary | ICD-10-CM | POA: Diagnosis present

## 2022-12-30 DIAGNOSIS — Z833 Family history of diabetes mellitus: Secondary | ICD-10-CM

## 2022-12-30 DIAGNOSIS — F02C0 Dementia in other diseases classified elsewhere, severe, without behavioral disturbance, psychotic disturbance, mood disturbance, and anxiety: Secondary | ICD-10-CM | POA: Diagnosis present

## 2022-12-30 DIAGNOSIS — E785 Hyperlipidemia, unspecified: Secondary | ICD-10-CM | POA: Diagnosis present

## 2022-12-30 DIAGNOSIS — E46 Unspecified protein-calorie malnutrition: Secondary | ICD-10-CM | POA: Diagnosis present

## 2022-12-30 DIAGNOSIS — I10 Essential (primary) hypertension: Secondary | ICD-10-CM | POA: Diagnosis present

## 2022-12-30 DIAGNOSIS — Z6821 Body mass index (BMI) 21.0-21.9, adult: Secondary | ICD-10-CM

## 2022-12-30 DIAGNOSIS — Z8781 Personal history of (healed) traumatic fracture: Secondary | ICD-10-CM | POA: Insufficient documentation

## 2022-12-30 DIAGNOSIS — S72002S Fracture of unspecified part of neck of left femur, sequela: Secondary | ICD-10-CM

## 2022-12-30 DIAGNOSIS — Z87440 Personal history of urinary (tract) infections: Secondary | ICD-10-CM

## 2022-12-30 LAB — CBC
HCT: 47.9 % (ref 39.0–52.0)
Hemoglobin: 14.7 g/dL (ref 13.0–17.0)
MCH: 26.4 pg (ref 26.0–34.0)
MCHC: 30.7 g/dL (ref 30.0–36.0)
MCV: 86 fL (ref 80.0–100.0)
Platelets: 534 10*3/uL — ABNORMAL HIGH (ref 150–400)
RBC: 5.57 MIL/uL (ref 4.22–5.81)
RDW: 13.8 % (ref 11.5–15.5)
WBC: 17.7 10*3/uL — ABNORMAL HIGH (ref 4.0–10.5)
nRBC: 0 % (ref 0.0–0.2)

## 2022-12-30 LAB — BASIC METABOLIC PANEL
Anion gap: 12 (ref 5–15)
BUN: 21 mg/dL (ref 8–23)
CO2: 23 mmol/L (ref 22–32)
Calcium: 9 mg/dL (ref 8.9–10.3)
Chloride: 100 mmol/L (ref 98–111)
Creatinine, Ser: 0.8 mg/dL (ref 0.61–1.24)
GFR, Estimated: >60 mL/min (ref >60)
Glucose, Bld: 131 mg/dL — ABNORMAL HIGH (ref 70–99)
Potassium: 4.3 mmol/L (ref 3.5–5.1)
Sodium: 135 mmol/L (ref 135–145)

## 2022-12-30 MED ORDER — MORPHINE SULFATE (PF) 4 MG/ML IV SOLN
4.0000 mg | Freq: Once | INTRAVENOUS | Status: AC
  Administered 2022-12-30: 4 mg via INTRAVENOUS
  Filled 2022-12-30: qty 1

## 2022-12-30 MED ORDER — CLONAZEPAM 0.5 MG PO TABS
0.5000 mg | ORAL_TABLET | Freq: Every day | ORAL | Status: DC
  Administered 2022-12-30 – 2023-01-05 (×7): 0.5 mg via ORAL
  Filled 2022-12-30 (×7): qty 1

## 2022-12-30 MED ORDER — ENOXAPARIN SODIUM 40 MG/0.4ML IJ SOSY
40.0000 mg | PREFILLED_SYRINGE | INTRAMUSCULAR | Status: DC
  Administered 2022-12-30 – 2023-01-05 (×7): 40 mg via SUBCUTANEOUS
  Filled 2022-12-30 (×7): qty 0.4

## 2022-12-30 MED ORDER — IBUPROFEN 200 MG PO TABS
600.0000 mg | ORAL_TABLET | Freq: Three times a day (TID) | ORAL | Status: DC
  Administered 2022-12-30 – 2022-12-31 (×2): 600 mg via ORAL
  Filled 2022-12-30 (×2): qty 3

## 2022-12-30 MED ORDER — HYDROMORPHONE HCL 1 MG/ML IJ SOLN: 1.0000 mg | Freq: Once | INTRAMUSCULAR | Status: DC

## 2022-12-30 MED ORDER — OXYCODONE HCL 5 MG PO TABS
5.0000 mg | ORAL_TABLET | Freq: Three times a day (TID) | ORAL | Status: DC | PRN
  Administered 2022-12-31 (×2): 5 mg via ORAL
  Filled 2022-12-30 (×2): qty 1

## 2022-12-30 MED ORDER — ASPIRIN 81 MG PO TBEC
81.0000 mg | DELAYED_RELEASE_TABLET | Freq: Every day | ORAL | Status: DC
  Administered 2022-12-30 – 2023-01-06 (×7): 81 mg via ORAL
  Filled 2022-12-30 (×7): qty 1

## 2022-12-30 MED ORDER — ACETAMINOPHEN 500 MG PO TABS
1000.0000 mg | ORAL_TABLET | Freq: Four times a day (QID) | ORAL | Status: DC
  Administered 2022-12-30 – 2023-01-01 (×5): 1000 mg via ORAL
  Filled 2022-12-30 (×5): qty 2

## 2022-12-30 MED ORDER — ATORVASTATIN CALCIUM 10 MG PO TABS
20.0000 mg | ORAL_TABLET | Freq: Every day | ORAL | Status: DC
  Administered 2022-12-30 – 2023-01-05 (×7): 20 mg via ORAL
  Filled 2022-12-30 (×7): qty 2

## 2022-12-30 NOTE — ED Notes (Signed)
Pt in xray via stretcher

## 2022-12-30 NOTE — ED Notes (Signed)
Observed 1 episode of rhythmic mvmt in right wrist. Then observed 2 episodes of similar activity, this time with R arm elevated and similar flicking motion of the wrist, along with some grasping motions of the hand. Not sure if this is nml at baseline or not. RN made aware.

## 2022-12-30 NOTE — ED Triage Notes (Addendum)
Pt BIB [facility] from [facility] c/o left hip pain. Pt was recently admitted from 3/31-4/4 for UTI and fall on 12/24/22. H/o Alzheimer's.   + external rotation and shortening noted to left leg during triage.

## 2022-12-30 NOTE — ED Notes (Signed)
ED TO INPATIENT HANDOFF REPORT  ED Nurse Name and Phone #:  Pennie RushingSeth A. Fadia Marlar, RN [phone]  S Name/Age/Gender Paul Klein 67 y.o. male Room/Bed: H021C/H021C  Code Status   Code Status: DNR  Home/SNF/Other Nursing Home Patient oriented to: self, place, time, and situation Is this baseline? Yes   Triage Complete: Triage complete  Chief Complaint S/p left hip fracture [Z87.81]  Triage Note [facility]/o left hip pain. Pt was recently admitted from 3/31-4/4 for UTI and fall on 12/24/22. H/o Alzheimer's.   + external rotation and shortening noted to left leg during triage.   Allergies Allergies  Allergen Reactions   Prozac [Fluoxetine Hcl]     SSRIs in general cause worsened depression and suicidal ideation    Level of Care/Admitting Diagnosis ED Disposition     ED Disposition  Admit   Condition  --   Comment  [facility] [[id number]]  Level of Care: Med-Surg [16]  [facility] or [facility] if equivalent level of care is available:: No  Covid Evaluation: Asymptomatic - no recent exposure (last 10 days) testing not required  Diagnosis: S/p left hip fracture [[id number]][[id number]]  Admitting Physician: Mercie EonMACHEN, JULIE [[id number]]  Attending Physician: Mercie EonMACHEN, JULIE [[id number]]  Certification:: I certify this patient will need inpatient services for at least 2 midnights  Estimated Length of Stay: 4          B Medical/Surgery History Past Medical History:  Diagnosis Date   Alzheimer's disease    Anxiety    Dr. Evelene CroonKaur   Diverticulosis of colon    sigmoid, per colonoscopy 2014, Dr. Elnoria HowardHung   Dyslipidemia    Hemorrhoids    internal and externa per 2014 colonoscopy   Insomnia    uses Neurontin ( Dr. Evelene CroonKaur)   Normal cardiac stress test 12/2012   performed due to risk factors, abnormal EKG; Dr. Donnie Ahoilley   Panic attack    Second hand smoke exposure    wife   Past Surgical History:  Procedure Laterality Date   ANAL  FISSURE REPAIR     COLONOSCOPY  02/19/13   diverticulosis of sigmoid colon, external and internal hemorrhoids, Dr. Elnoria HowardHung, repeat in 10 years   SKIN BIOPSY     face, back   WISDOM TOOTH EXTRACTION       A IV Location/Drains/Wounds Patient Lines/Drains/Airways Status     Active Line/Drains/Airways     Name Placement date Placement time Site Days   Peripheral IV 12/30/22 20 G 1" Anterior;Left Forearm 12/30/22  1646  Forearm  less than 1            Intake/Output Last 24 hours No intake or output data in the 24 hours ending 12/30/22 1913  Labs/Imaging Results for orders placed or performed during the hospital encounter of 12/30/22 (from the past 48 hour(s))  CBC     Status: Abnormal   Collection Time: 12/30/22  2:59 PM  Result Value Ref Range   WBC 17.7 (H) 4.0 - 10.5 K/uL   RBC 5.57 4.22 - 5.81 MIL/uL   Hemoglobin 14.7 13.0 - 17.0 g/dL   HCT 42.547.9 95.639.0 - 38.752.0 %   MCV 86.0 80.0 - 100.0 fL   MCH 26.4 26.0 - 34.0 pg   MCHC 30.7 30.0 - 36.0 g/dL   RDW 56.413.8 33.211.5 - 95.115.5 %   Platelets 534 (H) 150 - 400 K/uL   nRBC 0.0 0.0 - 0.2 %  Comment: Performed at [facility], [address], [place], [place] [address]  Basic metabolic panel     Status: Abnormal   Collection Time: 12/30/22  2:59 PM  Result Value Ref Range   Sodium 135 135 - 145 mmol/L   Potassium 4.3 3.5 - 5.1 mmol/L   Chloride 100 98 - 111 mmol/L   CO2 23 22 - 32 mmol/L   Glucose, Bld 131 (H) 70 - 99 mg/dL    Comment: Glucose reference range applies only to samples taken after fasting for at least 8 hours.   BUN 21 8 - 23 mg/dL   Creatinine, Ser 7.68 0.61 - 1.24 mg/dL   Calcium 9.0 8.9 - 08.8 mg/dL   GFR, Estimated >11 >03 mL/min    Comment: (NOTE) Calculated using the CKD-EPI Creatinine Equation (2021)    Anion gap 12 5 - 15    Comment: Performed at [facility], [address] [place], [place] [address]   DG Chest 1 View  Result Date: 12/30/2022 CLINICAL DATA:  Trauma, fall EXAM: CHEST  1 VIEW  COMPARISON:  Previous studies including the examination of 12/25/2022 FINDINGS: Transverse diameter of heart is in upper limits of normal. There is haziness in left hemithorax, possibly layering of small to moderate sized left pleural effusion. There is improvement in aeration in left lower lung field. There is no focal pulmonary consolidation. Right lateral CP angle is clear. There is no pneumothorax. IMPRESSION: There is mild diffuse haziness in the left hemithorax, possibly due to layering of small to moderate sized left pleural effusion in this supine radiograph. There are no signs of alveolar pulmonary edema or new focal pulmonary consolidation. Electronically Signed   By: Ernie Avena M.D.   On: 12/30/2022 15:15   DG Hip Unilat With Pelvis 2-3 Views Left  Result Date: 12/30/2022 CLINICAL DATA:  Fall EXAM: DG HIP (WITH OR WITHOUT PELVIS) 3V LEFT COMPARISON:  None Available. FINDINGS: Displaced transcervical proximal left femur fracture. Mild degenerative changes of the bilateral hips soft tissues are unremarkable. IMPRESSION: Displaced transcervical proximal left femur fracture. Electronically Signed   By: Allegra Lai M.D.   On: 12/30/2022 15:14    Pending Labs Unresulted Labs (From admission, onward)     Start     Ordered   12/31/22 0500  CBC  Tomorrow morning,   R        12/30/22 1907            Vitals/Pain Today's Vitals   12/30/22 1419 12/30/22 1420 12/30/22 1642 12/30/22 1840  BP: 125/76 125/76 137/85 (!) 143/75  Pulse: (!) 102 89 94 86  Resp: 18 19 18 18   Temp: 98.3 F (36.8 C) 97.9 F (36.6 C)  98.8 F (37.1 C)  TempSrc: Axillary Oral  Oral  SpO2: 98% 99% 100% 100%  Weight:  73 kg    Height:  6' (1.829 m)      Isolation Precautions No active isolations  Medications Medications  enoxaparin (LOVENOX) injection 40 mg (has no administration in time range)  morphine (PF) 4 MG/ML injection 4 mg (4 mg Intravenous Given 12/30/22 1731)     Mobility Non-ambulatory due to hip Fx     Focused Assessments Pt has fractured hip   R Recommendations: See Admitting Provider Note  Report given to:   Additional Notes: Call or epic message for additional questions

## 2022-12-30 NOTE — ED Provider Notes (Signed)
[place] EMERGENCY DEPARTMENT AT [facility] Provider Note   CSN: [id number] Arrival date & time: 12/30/22  1404     History  Chief Complaint  Patient presents with   Hip Pain    Paul Klein is a 67 y.o. male with past medical history significant for dementia, Alzheimer which has been rapidly progressive recently, encephalopathy, who was recently admitted for frequent falls, UTI.  He had 3 falls over the course of the week, most recent on 12/24/2022.  Per daughter he was ambulatory but with rapidly declining mental state, the acute change that has been noted is difficulty walking.  He had been complaining of some left leg pain while admitted but seemingly was localized more to the knee and upper leg.  No imaging done of the hip at that time, he presents now from his care facility for left hip pain.  There was external rotation and shortening of the left leg noted in triage.  Per wife, daughter his mental baseline is not significantly different.  He cannot tell me name, location, date, but responds yes or no to many questions although answers are somewhat nonsensical.   Hip Pain       Home Medications Prior to Admission medications   Medication Sig Start Date End Date Taking? Authorizing Provider  acetaminophen (TYLENOL) 325 MG tablet Take 2 tablets (650 mg total) by mouth every 6 (six) hours. 12/29/22 01/28/23  Patel, Azucena Cecil, DO  ASPIRIN LOW DOSE 81 MG EC tablet TAKE 1 TABLET(81 MG) BY MOUTH DAILY Patient taking differently: Take 81 mg by mouth daily. 06/28/21   Tysinger, Kermit Balo, PA-C  atorvastatin (LIPITOR) 20 MG tablet Take 20 mg by mouth at bedtime.    [provider]  clonazePAM (KLONOPIN) 0.5 MG tablet Take 1 tablet (0.5 mg total) by mouth at bedtime. 12/29/22   Modena Slater, DO  diclofenac Sodium (VOLTAREN) 1 % GEL Apply 2 g topically 4 (four) times daily. Apply to the left knee 12/29/22   Modena Slater, DO  Multiple Vitamin (MULTIVITAMIN) tablet Take 1 tablet by mouth  at bedtime. Patient not taking: Reported on 12/25/2022    [provider]  Omega-3 Fatty Acids (FISH OIL) 1000 MG CAPS Take 2,000 mg by mouth at bedtime. Patient not taking: Reported on 12/25/2022    [provider]      Allergies    Prozac [fluoxetine hcl]    Review of Systems   Review of Systems  All other systems reviewed and are negative.   Physical Exam Updated Vital Signs BP 137/85 (BP Location: Left Arm)   Pulse 94   Temp 97.9 F (36.6 C) (Oral)   Resp 18   Ht 6' (1.829 m)   Wt 73 kg   SpO2 100%   BMI 21.83 kg/m  Physical Exam Vitals and nursing note reviewed.  Constitutional:      General: He is not in acute distress.    Appearance: He is ill-appearing.  HENT:     Head: Normocephalic and atraumatic.  Eyes:     General:        Right eye: No discharge.        Left eye: No discharge.  Cardiovascular:     Rate and Rhythm: Normal rate and regular rhythm.     Pulses: Normal pulses.     Heart sounds: No murmur heard.    No friction rub. No gallop.     Comments: DP, PT pulses intact on the left Pulmonary:  Effort: Pulmonary effort is normal.     Breath sounds: Normal breath sounds.  Abdominal:     General: Bowel sounds are normal.     Palpations: Abdomen is soft.  Musculoskeletal:     Comments: Left leg is shortened and externally rotated.  He has some tenderness palpation at the left head of the femur.  Abnormal attempts at flexion, extension at the left hip.  Significant pain with attempted movement.  No significant tenderness about the left knee, intact flexion, extension at the left knee.  Skin:    General: Skin is warm and dry.     Capillary Refill: Capillary refill takes less than 2 seconds.  Neurological:     Mental Status: He is alert.     Comments: Not alert and oriented, can respond yes and no to simple questions.  Psychiatric:        Mood and Affect: Mood normal.        Behavior: Behavior normal.     ED Results / Procedures  / Treatments   Labs (all labs ordered are listed, but only abnormal results are displayed) Labs Reviewed  CBC - Abnormal; Notable for the following components:      Result Value   WBC 17.7 (*)    Platelets 534 (*)    All other components within normal limits  BASIC METABOLIC PANEL - Abnormal; Notable for the following components:   Glucose, Bld 131 (*)    All other components within normal limits    EKG EKG Interpretation  Date/Time:  Friday December 30 2022 16:32:47 EDT Ventricular Rate:  93 PR Interval:  144 QRS Duration: 84 QT Interval:  346 QTC Calculation: 430 R Axis:   79 Text Interpretation: Normal sinus rhythm Nonspecific T wave abnormality Abnormal ECG When compared with ECG of 25-Dec-2022 09:16, No significant change since last tracing Confirmed by Linwood DibblesKnapp, Jon [phone]([id number]) on 12/30/2022 4:49:26 PM  Radiology DG Chest 1 View  Result Date: 12/30/2022 CLINICAL DATA:  Trauma, fall EXAM: CHEST  1 VIEW COMPARISON:  Previous studies including the examination of 12/25/2022 FINDINGS: Transverse diameter of heart is in upper limits of normal. There is haziness in left hemithorax, possibly layering of small to moderate sized left pleural effusion. There is improvement in aeration in left lower lung field. There is no focal pulmonary consolidation. Right lateral CP angle is clear. There is no pneumothorax. IMPRESSION: There is mild diffuse haziness in the left hemithorax, possibly due to layering of small to moderate sized left pleural effusion in this supine radiograph. There are no signs of alveolar pulmonary edema or new focal pulmonary consolidation. Electronically Signed   By: Ernie AvenaPalani  Rathinasamy M.D.   On: 12/30/2022 15:15   DG Hip Unilat With Pelvis 2-3 Views Left  Result Date: 12/30/2022 CLINICAL DATA:  Fall EXAM: DG HIP (WITH OR WITHOUT PELVIS) 3V LEFT COMPARISON:  None Available. FINDINGS: Displaced transcervical proximal left femur fracture. Mild degenerative changes of the bilateral  hips soft tissues are unremarkable. IMPRESSION: Displaced transcervical proximal left femur fracture. Electronically Signed   By: Allegra LaiLeah  Strickland M.D.   On: 12/30/2022 15:14    Procedures Procedures    Medications Ordered in ED Medications  morphine (PF) 4 MG/ML injection 4 mg (4 mg Intravenous Given 12/30/22 1731)    ED Course/ Medical Decision Making/ A&P Clinical Course as of 12/30/22 1812  Fri Dec 30, 2022  1516 Quite demented at baseline -- having more frequent falls, admitted for UTI -- falling 3 times. Had previously  been ambulatory without much difficulty.  [CP]  1730 WBC(!): 17.7 Increased leukocytosis as well as platelets, likely acute phase reactant, vs pain response [CP]    Clinical Course User Index [CP] Chyanna Flock, Harrel Carina, PA-C                             Medical Decision Making Amount and/or Complexity of Data Reviewed Labs: ordered. Decision-making details documented in ED Course. Radiology: ordered.  Risk Prescription drug management.   This patient is a 67 y.o. male who presents to the ED for concern of ongoing left hip pain, inability to walk after recent admission.  Patient had been admitted from 3/31 to 4/4 for UTI, fall on 3/30.  His family had reported 3 falls in the course of a week, this involves an extensive number of treatment options, and is a complaint that carries with it a high risk of complications and morbidity. The emergent differential diagnosis prior to evaluation includes, but is not limited to, hip fracture, lumbar spine fracture, neurologic deficit causing balance abnormality, versus other. This is not an exhaustive differential.   Past Medical History / Co-morbidities / Social History: Hyperlipidemia, anxiety, bradycardia, hypertension, advanced age, Alzheimer's with poor mental status, I spoke with his family, he notably is with significant mental baseline deficits, he has some recognition of his wife and daughter but otherwise his  responses are generally nonsensical and he is not alert and oriented at all.  Additional history: Chart reviewed. Pertinent results include: Independently interpreted radiographic imaging, lab work from his recent previous hospitalization, notably he was without any x-ray imaging of the hips at that time.  Physical Exam: Physical exam performed. The pertinent findings include: Exam is limited secondary to his mental status but he does have some leg shortening and external rotation of the left leg, he expresses some pain with palpation of the left hip, and has decreased flexion, extension at the left hip.  No other notable injuries on my exam.  Lab Tests: I ordered, and personally interpreted labs.  The pertinent results include: CBC is notable for leukocytosis, 17.7, platelets are elevated at 534, this is increased from his recent previous admission, could be secondary to pain, acute phase reactant, he was already treated for his urinary tract infection, I do not have reason to suspect an additional infectious source, but CBC should be monitored during his hospital stay to track for resolution.   Imaging Studies: I ordered imaging studies including independently interpreted plain film chest x-ray,  Of the left hip. I independently visualized and interpreted imaging which showed some moderate haziness in the left lung base which could represent a small layering effusion in supine x-ray, no evidence of focal consolidation.  He has a  Displaced transcervical proximal left femur fracture.       . I agree with the radiologist interpretation.   Cardiac Monitoring:  The patient was maintained on a cardiac monitor.  My attending physician Dr. Lynelle Doctor viewed and interpreted the cardiac monitored which showed an underlying rhythm of: NSR, no significant change from last tracing. I agree with this interpretation.   Medications: I ordered medication including morphine  for pain. Reevaluation of the  patient after these medicines showed that the patient improved. I have reviewed the patients home medicines and have made adjustments as needed.  Consultations Obtained: I requested consultation with the orthopedic surgeon, spoke with Dale Bridgeton, PA who will evaluate the patient, and recommends hospital  admission for new hip fracture and need for repair.  I spoke with the hospitalist with internal medicine teaching service,  and discussed lab and imaging findings as well as pertinent plan - they recommend: Admission for left hip fracture   Disposition: After consideration of the diagnostic results and the patients response to treatment, I feel that patient would benefit from admission at this point for findings as above, no evidence of significant mental decline on his evaluation today, he has advanced alzheimers but is at his cognitive baseline.   emergency department workup does not suggest an emergent condition requiring admission or immediate intervention beyond what has been performed at this time. The plan is: as above. The patient is safe for discharge and has been instructed to return immediately for worsening symptoms, change in symptoms or any other concerns.  I discussed this case with my attending physician Dr. Lynelle DoctorKnapp who cosigned this note including patient's presenting symptoms, physical exam, and planned diagnostics and interventions. Attending physician stated agreement with plan or made changes to plan which were implemented.    Final Clinical Impression(s) / ED Diagnoses Final diagnoses:  Closed fracture of left hip, initial encounter    Rx / DC Orders ED Discharge Orders     None         West Balirosperi, Surya Schroeter H, PA-C 12/30/22 Mee [id number]    Knapp, Jon, MD 12/31/22 1501

## 2022-12-30 NOTE — Consult Note (Cosign Needed Addendum)
Reason for Consult:Left hip fx Referring Physician: Linwood Dibbles Time called: 1528 Time at bedside: 1534   Paul Klein is an 67 y.o. male.  HPI: Jacarius fell at the facility where he resides. This probably happened 2 weeks ago. Subsequent to that his mental status deteriorated and he was weaker. He was admitted but has severe dementia and is essentially non-verbal. He was discharged yesterday but was able to express to the SNF providers that his left hip was hurting. He returned to the ED where x-rays showed a left hip fx and orthopedic surgery was consulted. He is mostly non-ambulatory.  Past Medical History:  Diagnosis Date   Alzheimer's disease    Anxiety    Dr. Evelene Croon   Diverticulosis of colon    sigmoid, per colonoscopy 2014, Dr. Elnoria Howard   Dyslipidemia    Hemorrhoids    internal and externa per 2014 colonoscopy   Insomnia    uses Neurontin ( Dr. Evelene Croon)   Normal cardiac stress test 12/2012   performed due to risk factors, abnormal EKG; Dr. Donnie Aho   Panic attack    Second hand smoke exposure    wife    Past Surgical History:  Procedure Laterality Date   ANAL FISSURE REPAIR     COLONOSCOPY  02/19/13   diverticulosis of sigmoid colon, external and internal hemorrhoids, Dr. Elnoria Howard, repeat in 10 years   SKIN BIOPSY     face, back   WISDOM TOOTH EXTRACTION      Family History  Problem Relation Age of Onset   Heart disease Mother        BYPASS HEART SURGERY   Depression Mother    Mental illness Mother    Cancer Mother        skin   Dementia Mother    Hypertension Father    Benign prostatic hyperplasia Father    Cancer Father        skin   Heart disease Father 54       CABG   Depression Sister    Stroke Maternal Grandmother    Diabetes Paternal Grandfather     Social History:  reports that he quit smoking about 54 years ago. His smoking use included cigarettes. He has a 12.00 pack-year smoking history. He has never used smokeless tobacco. He reports current alcohol use. He  reports that he does not use drugs.  Allergies:  Allergies  Allergen Reactions   Prozac [Fluoxetine Hcl]     SSRIs in general cause worsened depression and suicidal ideation    Medications: I have reviewed the patient's current medications.  No results found for this or any previous visit (from the past 48 hour(s)).  DG Chest 1 View  Result Date: 12/30/2022 CLINICAL DATA:  Trauma, fall EXAM: CHEST  1 VIEW COMPARISON:  Previous studies including the examination of 12/25/2022 FINDINGS: Transverse diameter of heart is in upper limits of normal. There is haziness in left hemithorax, possibly layering of small to moderate sized left pleural effusion. There is improvement in aeration in left lower lung field. There is no focal pulmonary consolidation. Right lateral CP angle is clear. There is no pneumothorax. IMPRESSION: There is mild diffuse haziness in the left hemithorax, possibly due to layering of small to moderate sized left pleural effusion in this supine radiograph. There are no signs of alveolar pulmonary edema or new focal pulmonary consolidation. Electronically Signed   By: Ernie Avena M.D.   On: 12/30/2022 15:15   DG Hip Unilat With Pelvis  2-3 Views Left  Result Date: 12/30/2022 CLINICAL DATA:  Fall EXAM: DG HIP (WITH OR WITHOUT PELVIS) 3V LEFT COMPARISON:  None Available. FINDINGS: Displaced transcervical proximal left femur fracture. Mild degenerative changes of the bilateral hips soft tissues are unremarkable. IMPRESSION: Displaced transcervical proximal left femur fracture. Electronically Signed   By: Leah  Strickland M.D.   On: 12/30/2022 15:14    Review of Systems  Unable to perform ROS: Dementia   Blood pressure 125/76, pulse 89, temperature 97.9 F (36.6 C), temperature source Oral, resp. rate 19, height 6' (1.829 m), weight 73 kg, SpO2 99 %. Physical Exam Constitutional:      General: He is not in acute distress.    Appearance: He is well-developed. He is not  diaphoretic.  HENT:     Head: Normocephalic and atraumatic.  Eyes:     General: No scleral icterus.       Right eye: No discharge.        Left eye: No discharge.     Conjunctiva/sclera: Conjunctivae normal.  Cardiovascular:     Rate and Rhythm: Normal rate and regular rhythm.  Pulmonary:     Effort: Pulmonary effort is normal. No respiratory distress.  Musculoskeletal:     Cervical back: Normal range of motion.     Comments: LLE No traumatic wounds, ecchymosis, or rash  Mild TTP hip, maybe? Exam inconsistent.  No knee or ankle effusion  Knee stable to varus/ valgus and anterior/posterior stress  Sens DPN, SPN, TN could not assess  Motor EHL, ext, flex, evers could not assess  DP 2+, PT 2+, No significant edema  Skin:    General: Skin is warm and dry.  Neurological:     Mental Status: He is alert.  Psychiatric:        Mood and Affect: Mood normal.        Behavior: Behavior normal.     Assessment/Plan: Left hip fx -- Plan hip hemi Sunday with Dr. Murphy. Please keep NPO after MN.    Naliah Eddington J. Afrah Burlison, PA-C Orthopedic Surgery [phone] 12/30/2022, 3:38 PM  

## 2022-12-30 NOTE — H&P (View-Only) (Signed)
Reason for Consult:Left hip fx Referring Physician: Linwood Dibbles Time called: 1528 Time at bedside: 1534   Paul Klein is an 67 y.o. male.  HPI: Paul Klein fell at the facility where he resides. This probably happened 2 weeks ago. Subsequent to that his mental status deteriorated and he was weaker. He was admitted but has severe dementia and is essentially non-verbal. He was discharged yesterday but was able to express to the SNF providers that his left hip was hurting. He returned to the ED where x-rays showed a left hip fx and orthopedic surgery was consulted. He is mostly non-ambulatory.  Past Medical History:  Diagnosis Date   Alzheimer's disease    Anxiety    Dr. Evelene Croon   Diverticulosis of colon    sigmoid, per colonoscopy 2014, Dr. Elnoria Howard   Dyslipidemia    Hemorrhoids    internal and externa per 2014 colonoscopy   Insomnia    uses Neurontin ( Dr. Evelene Croon)   Normal cardiac stress test 12/2012   performed due to risk factors, abnormal EKG; Dr. Donnie Aho   Panic attack    Second hand smoke exposure    wife    Past Surgical History:  Procedure Laterality Date   ANAL FISSURE REPAIR     COLONOSCOPY  02/19/13   diverticulosis of sigmoid colon, external and internal hemorrhoids, Dr. Elnoria Howard, repeat in 10 years   SKIN BIOPSY     face, back   WISDOM TOOTH EXTRACTION      Family History  Problem Relation Age of Onset   Heart disease Mother        BYPASS HEART SURGERY   Depression Mother    Mental illness Mother    Cancer Mother        skin   Dementia Mother    Hypertension Father    Benign prostatic hyperplasia Father    Cancer Father        skin   Heart disease Father 54       CABG   Depression Sister    Stroke Maternal Grandmother    Diabetes Paternal Grandfather     Social History:  reports that he quit smoking about 54 years ago. His smoking use included cigarettes. He has a 12.00 pack-year smoking history. He has never used smokeless tobacco. He reports current alcohol use. He  reports that he does not use drugs.  Allergies:  Allergies  Allergen Reactions   Prozac [Fluoxetine Hcl]     SSRIs in general cause worsened depression and suicidal ideation    Medications: I have reviewed the patient's current medications.  No results found for this or any previous visit (from the past 48 hour(s)).  DG Chest 1 View  Result Date: 12/30/2022 CLINICAL DATA:  Trauma, fall EXAM: CHEST  1 VIEW COMPARISON:  Previous studies including the examination of 12/25/2022 FINDINGS: Transverse diameter of heart is in upper limits of normal. There is haziness in left hemithorax, possibly layering of small to moderate sized left pleural effusion. There is improvement in aeration in left lower lung field. There is no focal pulmonary consolidation. Right lateral CP angle is clear. There is no pneumothorax. IMPRESSION: There is mild diffuse haziness in the left hemithorax, possibly due to layering of small to moderate sized left pleural effusion in this supine radiograph. There are no signs of alveolar pulmonary edema or new focal pulmonary consolidation. Electronically Signed   By: Ernie Avena M.D.   On: 12/30/2022 15:15   DG Hip Unilat With Pelvis  2-3 Views Left  Result Date: 12/30/2022 CLINICAL DATA:  Fall EXAM: DG HIP (WITH OR WITHOUT PELVIS) 3V LEFT COMPARISON:  None Available. FINDINGS: Displaced transcervical proximal left femur fracture. Mild degenerative changes of the bilateral hips soft tissues are unremarkable. IMPRESSION: Displaced transcervical proximal left femur fracture. Electronically Signed   By: Allegra LaiLeah  Strickland M.D.   On: 12/30/2022 15:14    Review of Systems  Unable to perform ROS: Dementia   Blood pressure 125/76, pulse 89, temperature 97.9 F (36.6 C), temperature source Oral, resp. rate 19, height 6' (1.829 m), weight 73 kg, SpO2 99 %. Physical Exam Constitutional:      General: He is not in acute distress.    Appearance: He is well-developed. He is not  diaphoretic.  HENT:     Head: Normocephalic and atraumatic.  Eyes:     General: No scleral icterus.       Right eye: No discharge.        Left eye: No discharge.     Conjunctiva/sclera: Conjunctivae normal.  Cardiovascular:     Rate and Rhythm: Normal rate and regular rhythm.  Pulmonary:     Effort: Pulmonary effort is normal. No respiratory distress.  Musculoskeletal:     Cervical back: Normal range of motion.     Comments: LLE No traumatic wounds, ecchymosis, or rash  Mild TTP hip, maybe? Exam inconsistent.  No knee or ankle effusion  Knee stable to varus/ valgus and anterior/posterior stress  Sens DPN, SPN, TN could not assess  Motor EHL, ext, flex, evers could not assess  DP 2+, PT 2+, No significant edema  Skin:    General: Skin is warm and dry.  Neurological:     Mental Status: He is alert.  Psychiatric:        Mood and Affect: Mood normal.        Behavior: Behavior normal.     Assessment/Plan: Left hip fx -- Plan hip hemi Sunday with Dr. Eulah PontMurphy. Please keep NPO after MN.    Freeman CaldronMichael J. Kemari Mares, PA-C Orthopedic Surgery [phone] 12/30/2022, 3:38 PM

## 2022-12-30 NOTE — H&P (Cosign Needed)
Date: 12/30/2022               Patient Name:  Paul Klein Lown MRN: [id number]  DOB: 05/02/1956 Age / Sex: 67 y.o., male   PCP: System, Provider Not In         Medical Service: Internal Medicine Teaching Service         Attending Physician: Dr. Linwood DibblesKnapp, Jon, MD      First Contact: Dr. Modena SlaterAmar Patel, DO Pager [phone]    Second Contact: Dr. Evlyn KannerPhillip Braswell, MD Pager [phone]         After Hours (After 5p/  First Contact Pager: [phone]  weekends / holidays): Second Contact Pager: [phone]   SUBJECTIVE  Chief Complaint: hip pain  History of Present Illness: Paul Klein Kukla is a 67 y.o. male with a pertinent PMH of Alzheimer's dementia, who [facility] with [facility] hip pain.  Patient presents with recent history of multiple falls, most recently on March 30.  History was obtained from his wife, Sedalia MutaDiane as he is unable to answer any questions.  Diane reports that he was ambulatory at times, but encouraged to use wheelchair in facility.  Patient was admitted to hospital on April 1 due to progressive altered mental status.  On that admission there was concern if there was an infective cause to explain this versus medications contributing or progression of his Alzheimer's dementia.  He did not have tenderness to palpation of hips, buttocks right knee or ankle on day of discharge.  He did have pain at knee and x-ray of left knee was negative for fracture.  [facility] to memory care facility.    Facility sent patient back to emergency room as he had increased pain with transferring.  X-ray of his hip showed displaced left hip fracture and ortho was consulted in the ED.  Medications: Acetaminophen 650 mg every 6 hours Aspirin 81 mg Atorvastatin 20 mg nightly Clonazepam 0.5 mg nightly Diclofenac gel Fish oil multivitamin  Past Medical History:  Past Medical History:  Diagnosis Date   Alzheimer's disease    Anxiety    Dr. Evelene CroonKaur   Diverticulosis of colon    sigmoid, per  colonoscopy 2014, Dr. Elnoria HowardHung   Dyslipidemia    Hemorrhoids    internal and externa per 2014 colonoscopy   Insomnia    uses Neurontin ( Dr. Evelene CroonKaur)   Normal cardiac stress test 12/2012   performed due to risk factors, abnormal EKG; Dr. Donnie Ahoilley   Panic attack    Second hand smoke exposure    wife    Social:  L[facility] Support -his wife, Sedalia MutaDiane and [place] in [place] Level of function -prior to fall 3/31, he was restricted to wheelchair.  Wife states that he would eat a few weeks ago but eating had decreased recently  Family History: Family History  Problem Relation Age of Onset   Heart disease Mother        BYPASS HEART SURGERY   Depression Mother    Mental illness Mother    Cancer Mother        skin   Dementia Mother    Hypertension Father    Benign prostatic hyperplasia Father    Cancer Father        skin   Heart disease Father [id number]       CABG   Depression Sister    Stroke Maternal Grandmother    Diabetes Paternal Grandfather  Allergies: Allergies as of 12/30/2022 - Review Complete 12/30/2022  Allergen Reaction Noted   Prozac [fluoxetine hcl]  12/25/2012    Review of Systems: A complete ROS was negative except as per HPI.   OBJECTIVE:  Physical Exam: Blood pressure 137/85, pulse 94, temperature 97.9 F (36.6 C), temperature source Oral, resp. rate 18, height 6' (1.829 m), weight 73 kg, SpO2 100 %. In no acute distress, intermittently tracks speaker Intermittently says words to questions but does not answer question Pupils are equal and reactive to light Yellow bruising present to left temple Regular rate and rhythm, no M/R/G Normal respiratory effort with room air, lungs without wheezing, crackles or rhonchi with anterior auscultation Left lower extremity is shortened and externally rotated No swelling to lower extremities bilaterally Skin is warm and dry  Appears euvolemic   Pertinent Labs: CBC    Component Value Date/Time    WBC 17.7 (H) 12/30/2022 1459   RBC 5.57 12/30/2022 1459   HGB 14.7 12/30/2022 1459   HGB 14.7 11/30/2020 1154   HCT 47.9 12/30/2022 1459   HCT 44.4 11/30/2020 1154   PLT 534 (H) 12/30/2022 1459   PLT 208 11/30/2020 1154   MCV 86.0 12/30/2022 1459   MCV 92 11/30/2020 1154   MCH 26.4 12/30/2022 1459   MCHC 30.7 12/30/2022 1459   RDW 13.8 12/30/2022 1459   RDW 13.0 11/30/2020 1154   LYMPHSABS 1.0 12/28/2022 1048   LYMPHSABS 1.2 04/29/2020 0911   MONOABS 0.6 12/28/2022 1048   EOSABS 0.2 12/28/2022 1048   EOSABS 0.0 04/29/2020 0911   BASOSABS 0.1 12/28/2022 1048   BASOSABS 0.0 04/29/2020 0911     CMP     Component Value Date/Time   NA 135 12/30/2022 1459   NA 142 11/30/2020 1154   K 4.3 12/30/2022 1459   CL 100 12/30/2022 1459   CO2 23 12/30/2022 1459   GLUCOSE 131 (H) 12/30/2022 1459   BUN 21 12/30/2022 1459   BUN 9 11/30/2020 1154   CREATININE 0.80 12/30/2022 1459   CREATININE 0.94 08/31/2017 0918   CALCIUM 9.0 12/30/2022 1459   PROT 6.5 12/25/2022 0923   PROT 6.9 11/30/2020 1154   ALBUMIN 2.9 (L) 12/25/2022 0923   ALBUMIN 4.9 (H) 11/30/2020 1154   AST 16 12/25/2022 0923   ALT 17 12/25/2022 0923   ALKPHOS 80 12/25/2022 0923   BILITOT 0.7 12/25/2022 0923   BILITOT 0.4 11/30/2020 1154   GFRNONAA >60 12/30/2022 1459   GFRAA 96 04/29/2020 0911    Pertinent Imaging: Chest x-ray with moderate size left pleural effusion  Hip x-ray showing displaced transcervical proximal left femur fracture.  EKG: sinus tachycardia   ASSESSMENT & PLAN:  Assessment: Active Problems:   * No active hospital problems. *   [name] is a 67 y.o. with pertinent PMH of Alzheimer's dementia and hyperlipidemia who present from memory care unit at [facility] with left hip pain and admit for displaced left femur fracture on hospital day 0  Plan:  Left femur fracture Patient presenting from facility with externally rotated and shortened left lower extremity. X-ray showed  displaced transcervical proximal left femur fracture. He was evaluated by ortho with plans for hip hemiarthroplasty on Sunday with Dr. Eulah Pont.  From reviewing the prior admission notes it appears that family has ongoing goals of care discussions for this patient.  Called and spoke with Diane, patient's wife and POA.  Her goals for Bynum are to keep him comfortable and safe.  She is concerned  that surgery may not be the best option to achieve this.  Talked about the displaced nature of his fracture and need for clarification from Ortho if this is a stable fracture that would be amenable to not having surgery.  She understands that if he did not have surgery he would not be able to walk again and even with surgery this is a likely possibility. She is open to surgery if it is needed for comfort. -continue goals of care conversation, Sedalia MutaDiane and Morrie Sheldonshley plan to come to hospital 4/6 -tylenol 1000 mg q6 hrs -Ibuprofen 600 mg q8hrs -oxy 5mg  q 8hrs prn -cbc am  Leukocytosis WBC elevated at 17. He was recently treated for UTI. He is afebrile with normal vital signs. He is unable to answer review of system questions. CXR did show left sided pleural effusion but seems unchanged from recent admission. He has stable O2 saturations on room air. Other differential could be reactive from hip fracture. -trend CBC  Left sided pleural effusion Unchanged from recent admission.   Alzheimer's disease He is nearing end stage dementia. Continuing to eat if feed by someone else. He is at baseline per Diane. -clonazepam 0.5 mg  Goals of care Patient is DNR/DNI with limited interventions. I called and talked with Diane. She is most concerned about keeping patient safe, comfortable, and pain-free. She is unsure if she would like to pursue surgery for him and would like to talk with her daughter   Best Practice: Diet: regular VTE: lovenox Code: DNR/DNI Status: Inpatient with expected length of stay greater than 2  midnights. Anticipated Discharge Location: SNF Barriers to Discharge: goals of care conversation about hip surgery  Signature: Memory DanceKatie M. Akeen Ledyard, D.O.  Internal Medicine Resident, PGY-2 [facility]  Pager: [phone] 6:11 PM, 12/30/2022   Please contact the on call pager after 5 pm and on weekends at [phone].

## 2022-12-31 DIAGNOSIS — S72002A Fracture of unspecified part of neck of left femur, initial encounter for closed fracture: Secondary | ICD-10-CM

## 2022-12-31 DIAGNOSIS — F0280 Dementia in other diseases classified elsewhere without behavioral disturbance: Secondary | ICD-10-CM | POA: Diagnosis not present

## 2022-12-31 DIAGNOSIS — G309 Alzheimer's disease, unspecified: Secondary | ICD-10-CM

## 2022-12-31 DIAGNOSIS — S7292XA Unspecified fracture of left femur, initial encounter for closed fracture: Secondary | ICD-10-CM

## 2022-12-31 LAB — CBC
HCT: 42.9 % (ref 39.0–52.0)
Hemoglobin: 13.7 g/dL (ref 13.0–17.0)
MCH: 26.8 pg (ref 26.0–34.0)
MCHC: 31.9 g/dL (ref 30.0–36.0)
MCV: 84 fL (ref 80.0–100.0)
Platelets: 439 10*3/uL — ABNORMAL HIGH (ref 150–400)
RBC: 5.11 MIL/uL (ref 4.22–5.81)
RDW: 13.8 % (ref 11.5–15.5)
WBC: 15 10*3/uL — ABNORMAL HIGH (ref 4.0–10.5)
nRBC: 0 % (ref 0.0–0.2)

## 2022-12-31 LAB — SURGICAL PCR SCREEN
MRSA, PCR: NEGATIVE
Staphylococcus aureus: NEGATIVE

## 2022-12-31 MED ORDER — TRANEXAMIC ACID-NACL 1000-0.7 MG/100ML-% IV SOLN: 1000.0000 mg | INTRAVENOUS | Status: DC

## 2022-12-31 MED ORDER — CEFAZOLIN SODIUM-DEXTROSE 2-4 GM/100ML-% IV SOLN
2.0000 g | INTRAVENOUS | Status: AC
  Administered 2023-01-01: 2 g via INTRAVENOUS

## 2022-12-31 MED ORDER — MUPIROCIN 2 % EX OINT
1.0000 | TOPICAL_OINTMENT | Freq: Two times a day (BID) | CUTANEOUS | Status: AC
  Administered 2022-12-31 – 2023-01-05 (×9): 1 via NASAL
  Filled 2022-12-31 (×2): qty 22

## 2022-12-31 MED ORDER — POLYETHYLENE GLYCOL 3350 17 G PO PACK
17.0000 g | PACK | Freq: Every day | ORAL | Status: DC | PRN
  Administered 2022-12-31: 17 g via ORAL
  Filled 2022-12-31: qty 1

## 2022-12-31 MED ORDER — ORAL CARE MOUTH RINSE: 15.0000 mL | OROMUCOSAL | Status: DC | PRN

## 2022-12-31 MED ORDER — POLYETHYLENE GLYCOL 3350 17 G PO PACK: 17.0000 g | PACK | Freq: Every day | ORAL | Status: DC

## 2022-12-31 MED ORDER — IBUPROFEN 200 MG PO TABS
200.0000 mg | ORAL_TABLET | Freq: Three times a day (TID) | ORAL | Status: DC
  Administered 2022-12-31 – 2023-01-06 (×15): 200 mg via ORAL
  Filled 2022-12-31 (×15): qty 1

## 2022-12-31 MED ORDER — SENNOSIDES-DOCUSATE SODIUM 8.6-50 MG PO TABS
1.0000 | ORAL_TABLET | Freq: Every day | ORAL | Status: DC
  Administered 2022-12-31: 1 via ORAL
  Filled 2022-12-31: qty 1

## 2022-12-31 MED ORDER — POVIDONE-IODINE 10 % EX SWAB
2.0000 | Freq: Once | CUTANEOUS | Status: AC
  Administered 2023-01-01: 2 via TOPICAL

## 2022-12-31 MED ORDER — CHLORHEXIDINE GLUCONATE 4 % EX LIQD
60.0000 mL | Freq: Once | CUTANEOUS | Status: AC
  Administered 2023-01-01: 4 via TOPICAL

## 2022-12-31 MED ORDER — TRANEXAMIC ACID-NACL 1000-0.7 MG/100ML-% IV SOLN
1000.0000 mg | INTRAVENOUS | Status: AC
  Administered 2023-01-01: 1000 mg via INTRAVENOUS

## 2022-12-31 NOTE — Progress Notes (Signed)
   NAME:  Paul Klein, MRN:  [id number], DOB:  02/17/1956, LOS: 1 ADMISSION DATE:  12/30/2022  Subjective  Patient evaluated at bedside this AM. Denies any pain, reports he feels okay. Was able to eat some this morning.   Objective   Blood pressure 135/78, pulse 85, temperature 98.7 F (37.1 C), temperature source Oral, resp. rate 16, height 6' (1.829 m), weight 73 kg, SpO2 98 %.    No intake or output data in the 24 hours ending 12/31/22 0957 Filed Weights   12/30/22 1420  Weight: 73 kg   Physical Exam: General: Resting comfortably in bed, no acute distress CV: Regular rate, rhythm. No murmurs. Warm extremities.  Pulm: Normal work of breathing on room air. Clear to auscultation bilaterally.  Abdomen: Soft, non-tender, non-distended. Normoactive bowel sounds.  MSK: Normal bulk, tone. No peripheral edema.  Neuro: Awake, alert, intermittently responding to questions. Grossly non-focal.   Labs       Latest Ref Rng & Units 12/31/2022    2:22 AM 12/30/2022    2:59 PM 12/28/2022   10:48 AM  CBC  WBC 4.0 - 10.5 K/uL 15.0  17.7  12.1   Hemoglobin 13.0 - 17.0 g/dL 67.8  93.8  10.1   Hematocrit 39.0 - 52.0 % 42.9  47.9  40.6   Platelets 150 - 400 K/uL 439  534  414       Latest Ref Rng & Units 12/30/2022    2:59 PM 12/28/2022   10:48 AM 12/27/2022    5:05 AM  BMP  Glucose 70 - 99 mg/dL 751  025  852   BUN 8 - 23 mg/dL 21  16  16    Creatinine 0.61 - 1.24 mg/dL 7.78  2.42  3.53   Sodium 135 - 145 mmol/L 135  134  137   Potassium 3.5 - 5.1 mmol/L 4.3  3.7  4.0   Chloride 98 - 111 mmol/L 100  99  103   CO2 22 - 32 mmol/L 23  24  24    Calcium 8.9 - 10.3 mg/dL 9.0  8.7  8.8     Summary   Paul Klein is 66yo [name] and hyperlipidemia admitted 4/5 with displaced proximal left femur fracture, pending hemiarthroplasty with orthopedics.   Assessment & Plan:  Principal Problem:   Femur fracture, left  #Displaced proximal left femur fracture Discussed with  orthopedics this morning, most likely surgical management would be best for comfort long-term given blood supply to head of femur has been destroyed. Will discuss with family regarding their wishes. Patient this morning does not appear to be in pain, will continue with current regimen. - Appreciate orthopedics recommendations - Plan for hemiarthroplasty tomorrow AM - Tylenol 1000mg  every 6 hours - Ibuprofen 200mg  every 8 hours - Oxycodone 5mg  every 8 hours as needed - Bowel regimen in place - DVT prophylaxis - PT/OT after surgery  #Alzheimer's dementia Appears at baseline mental status this morning. Would avoid telemetry and lines as much as possible. Will continue with low-dose clonazepam, avoid other centrally-acting medications.  - Continue clonazepam 0.5mg  QHS - Delirium precautions in place  Best practice:  DIET: Regular IVF: n/a DVT PPX: lovenox BOWEL: senokot, Miralax CODE: DNR FAM COM: will discuss with family regarding surgery today  Evlyn Kanner, MD Internal Medicine Resident PGY-3 PAGER: [phone] 12/31/2022 9:57 AM  If after hours (below), please contact on-call pager: [phone] 5PM-7AM Monday-Friday 1PM-7AM Saturday-Sunday

## 2022-12-31 NOTE — Hospital Course (Addendum)
4/6 Feeling better, not in any pain today Did not eat this AM   4/8: Patient not responding to most verbal questioning. Reports left lower extremity pain.   4/9: Patient not responding to verbal stimuli.   4/10: Patient is hungry. Responding with some verbal responses. No response to questioning of pain. Following some commands.   04/12: No pain. He has no concerns this morning. Tough to have a conversation with me.  He follows some commands. He says that his thumb hurts.

## 2022-12-31 NOTE — Progress Notes (Signed)
Patient is refusing to eat after one or two bites of food.  Have tried jello, ice cream, and fruit cups in addition to meals.

## 2022-12-31 NOTE — Plan of Care (Signed)
  Problem: Clinical Measurements: Goal: Will remain free from infection Outcome: Progressing Goal: Cardiovascular complication will be avoided Outcome: Progressing   Problem: Nutrition: Goal: Adequate nutrition will be maintained Outcome: Not Progressing Note: Dietician consulted.   Problem: Clinical Measurements: Goal: Respiratory complications will improve Outcome: Not Applicable

## 2023-01-01 ENCOUNTER — Encounter (HOSPITAL_COMMUNITY): Payer: Self-pay | Admitting: Internal Medicine

## 2023-01-01 ENCOUNTER — Inpatient Hospital Stay (HOSPITAL_COMMUNITY): Payer: Medicare HMO | Admitting: Certified Registered Nurse Anesthetist

## 2023-01-01 ENCOUNTER — Inpatient Hospital Stay (HOSPITAL_COMMUNITY): Payer: Medicare HMO

## 2023-01-01 ENCOUNTER — Other Ambulatory Visit: Payer: Self-pay

## 2023-01-01 ENCOUNTER — Encounter (HOSPITAL_COMMUNITY)
Admission: EM | Disposition: A | Discharge status: Home Health | Payer: Self-pay | Source: Skilled Nursing Facility | Attending: Internal Medicine

## 2023-01-01 DIAGNOSIS — I2510 Atherosclerotic heart disease of native coronary artery without angina pectoris: Secondary | ICD-10-CM

## 2023-01-01 DIAGNOSIS — F0280 Dementia in other diseases classified elsewhere without behavioral disturbance: Secondary | ICD-10-CM | POA: Diagnosis not present

## 2023-01-01 DIAGNOSIS — S72002A Fracture of unspecified part of neck of left femur, initial encounter for closed fracture: Secondary | ICD-10-CM | POA: Diagnosis not present

## 2023-01-01 DIAGNOSIS — I10 Essential (primary) hypertension: Secondary | ICD-10-CM

## 2023-01-01 DIAGNOSIS — G309 Alzheimer's disease, unspecified: Secondary | ICD-10-CM | POA: Diagnosis not present

## 2023-01-01 DIAGNOSIS — R1310 Dysphagia, unspecified: Secondary | ICD-10-CM

## 2023-01-01 DIAGNOSIS — Z87891 Personal history of nicotine dependence: Secondary | ICD-10-CM

## 2023-01-01 HISTORY — PX: HIP ARTHROPLASTY: SHX981

## 2023-01-01 LAB — BASIC METABOLIC PANEL
Anion gap: 10 (ref 5–15)
BUN: 23 mg/dL (ref 8–23)
CO2: 27 mmol/L (ref 22–32)
Calcium: 9.3 mg/dL (ref 8.9–10.3)
Chloride: 104 mmol/L (ref 98–111)
Creatinine, Ser: 0.87 mg/dL (ref 0.61–1.24)
GFR, Estimated: >60 mL/min (ref >60)
Glucose, Bld: 129 mg/dL — ABNORMAL HIGH (ref 70–99)
Potassium: 3.9 mmol/L (ref 3.5–5.1)
Sodium: 141 mmol/L (ref 135–145)

## 2023-01-01 LAB — PROTIME-INR
INR: 1.1 (ref 0.8–1.2)
Prothrombin Time: 14.5 seconds (ref 11.4–15.2)

## 2023-01-01 LAB — CBC
HCT: 44.1 % (ref 39.0–52.0)
Hemoglobin: 14 g/dL (ref 13.0–17.0)
MCH: 27 pg (ref 26.0–34.0)
MCHC: 31.7 g/dL (ref 30.0–36.0)
MCV: 85 fL (ref 80.0–100.0)
Platelets: 397 10*3/uL (ref 150–400)
RBC: 5.19 MIL/uL (ref 4.22–5.81)
RDW: 13.8 % (ref 11.5–15.5)
WBC: 11.9 10*3/uL — ABNORMAL HIGH (ref 4.0–10.5)
nRBC: 0 % (ref 0.0–0.2)

## 2023-01-01 LAB — CULTURE, BLOOD (ROUTINE X 2)
Culture: NO GROWTH
Culture: NO GROWTH
Special Requests: ADEQUATE

## 2023-01-01 LAB — MAGNESIUM: Magnesium: 2.3 mg/dL (ref 1.7–2.4)

## 2023-01-01 SURGERY — HEMIARTHROPLASTY, HIP, DIRECT ANTERIOR APPROACH, FOR FRACTURE
Anesthesia: General | Site: Hip | Laterality: Left

## 2023-01-01 MED ORDER — CHLORHEXIDINE GLUCONATE 0.12 % MT SOLN
OROMUCOSAL | Status: AC
  Filled 2023-01-01: qty 15

## 2023-01-01 MED ORDER — PANTOPRAZOLE SODIUM 40 MG PO TBEC
40.0000 mg | DELAYED_RELEASE_TABLET | Freq: Every day | ORAL | Status: DC
  Administered 2023-01-01 – 2023-01-06 (×6): 40 mg via ORAL
  Filled 2023-01-01 (×6): qty 1

## 2023-01-01 MED ORDER — MORPHINE SULFATE (PF) 2 MG/ML IV SOLN: 0.5000 mg | INTRAVENOUS | Status: DC | PRN

## 2023-01-01 MED ORDER — CEFAZOLIN SODIUM-DEXTROSE 2-4 GM/100ML-% IV SOLN
INTRAVENOUS | Status: AC
  Filled 2023-01-01: qty 100

## 2023-01-01 MED ORDER — ONDANSETRON HCL 4 MG/2ML IJ SOLN: 4.0000 mg | Freq: Four times a day (QID) | INTRAMUSCULAR | Status: DC | PRN

## 2023-01-01 MED ORDER — DEXAMETHASONE SODIUM PHOSPHATE 10 MG/ML IJ SOLN
INTRAMUSCULAR | Status: DC | PRN
  Administered 2023-01-01: 5 mg via INTRAVENOUS

## 2023-01-01 MED ORDER — PHENYLEPHRINE HCL-NACL 20-0.9 MG/250ML-% IV SOLN
INTRAVENOUS | Status: DC | PRN
  Administered 2023-01-01: 50 ug/min via INTRAVENOUS

## 2023-01-01 MED ORDER — CHLORHEXIDINE GLUCONATE 0.12 % MT SOLN: 15.0000 mL | Freq: Once | OROMUCOSAL | Status: DC

## 2023-01-01 MED ORDER — ONDANSETRON HCL 4 MG/2ML IJ SOLN
INTRAMUSCULAR | Status: DC | PRN
  Administered 2023-01-01: 4 mg via INTRAVENOUS

## 2023-01-01 MED ORDER — ROCURONIUM BROMIDE 10 MG/ML (PF) SYRINGE
PREFILLED_SYRINGE | INTRAVENOUS | Status: DC | PRN
  Administered 2023-01-01: 50 mg via INTRAVENOUS
  Administered 2023-01-01: 20 mg via INTRAVENOUS

## 2023-01-01 MED ORDER — POLYETHYLENE GLYCOL 3350 17 G PO PACK
17.0000 g | PACK | Freq: Every day | ORAL | Status: DC
  Administered 2023-01-02 – 2023-01-05 (×4): 17 g via ORAL
  Filled 2023-01-01 (×4): qty 1

## 2023-01-01 MED ORDER — PHENOL 1.4 % MT LIQD: 1.0000 | OROMUCOSAL | Status: DC | PRN

## 2023-01-01 MED ORDER — LACTATED RINGERS IV SOLN: INTRAVENOUS | Status: DC

## 2023-01-01 MED ORDER — ALUM & MAG HYDROXIDE-SIMETH 200-200-20 MG/5ML PO SUSP: 30.0000 mL | ORAL | Status: DC | PRN

## 2023-01-01 MED ORDER — ORAL CARE MOUTH RINSE: 15.0000 mL | Freq: Once | OROMUCOSAL | Status: DC

## 2023-01-01 MED ORDER — MIDAZOLAM HCL 2 MG/2ML IJ SOLN
INTRAMUSCULAR | Status: AC
  Filled 2023-01-01: qty 2

## 2023-01-01 MED ORDER — FENTANYL CITRATE (PF) 100 MCG/2ML IJ SOLN
INTRAMUSCULAR | Status: AC
  Filled 2023-01-01: qty 2

## 2023-01-01 MED ORDER — LIDOCAINE 2% (20 MG/ML) 5 ML SYRINGE
INTRAMUSCULAR | Status: DC | PRN
  Administered 2023-01-01: 60 mg via INTRAVENOUS

## 2023-01-01 MED ORDER — CEFAZOLIN SODIUM-DEXTROSE 2-4 GM/100ML-% IV SOLN
2.0000 g | Freq: Four times a day (QID) | INTRAVENOUS | Status: AC
  Administered 2023-01-01 (×2): 2 g via INTRAVENOUS
  Filled 2023-01-01 (×2): qty 100

## 2023-01-01 MED ORDER — METOCLOPRAMIDE HCL 5 MG/ML IJ SOLN: 5.0000 mg | Freq: Three times a day (TID) | INTRAMUSCULAR | Status: DC | PRN

## 2023-01-01 MED ORDER — PROPOFOL 10 MG/ML IV BOLUS
INTRAVENOUS | Status: DC | PRN
  Administered 2023-01-01: 70 mg via INTRAVENOUS

## 2023-01-01 MED ORDER — MENTHOL 3 MG MT LOZG: 1.0000 | LOZENGE | OROMUCOSAL | Status: DC | PRN

## 2023-01-01 MED ORDER — FENTANYL CITRATE (PF) 100 MCG/2ML IJ SOLN
25.0000 ug | INTRAMUSCULAR | Status: DC | PRN
  Administered 2023-01-01: 25 ug via INTRAVENOUS

## 2023-01-01 MED ORDER — ACETAMINOPHEN 500 MG PO TABS
500.0000 mg | ORAL_TABLET | Freq: Four times a day (QID) | ORAL | Status: AC
  Administered 2023-01-01 – 2023-01-02 (×3): 500 mg via ORAL
  Filled 2023-01-01: qty 1

## 2023-01-01 MED ORDER — 0.9 % SODIUM CHLORIDE (POUR BTL) OPTIME
TOPICAL | Status: DC | PRN
  Administered 2023-01-01: 1000 mL

## 2023-01-01 MED ORDER — PROPOFOL 10 MG/ML IV BOLUS
INTRAVENOUS | Status: AC
  Filled 2023-01-01: qty 20

## 2023-01-01 MED ORDER — HYDROCODONE-ACETAMINOPHEN 5-325 MG PO TABS
1.0000 | ORAL_TABLET | ORAL | Status: DC | PRN
  Administered 2023-01-01: 2 via ORAL
  Administered 2023-01-02 – 2023-01-04 (×5): 1 via ORAL
  Administered 2023-01-04: 2 via ORAL
  Filled 2023-01-01 (×2): qty 1
  Filled 2023-01-01: qty 2
  Filled 2023-01-01 (×2): qty 1
  Filled 2023-01-01: qty 2
  Filled 2023-01-01: qty 1

## 2023-01-01 MED ORDER — FENTANYL CITRATE (PF) 250 MCG/5ML IJ SOLN
INTRAMUSCULAR | Status: DC | PRN
  Administered 2023-01-01 (×4): 50 ug via INTRAVENOUS

## 2023-01-01 MED ORDER — BISACODYL 10 MG RE SUPP: 10.0000 mg | Freq: Every day | RECTAL | Status: DC | PRN

## 2023-01-01 MED ORDER — METHOCARBAMOL 500 MG PO TABS
500.0000 mg | ORAL_TABLET | Freq: Four times a day (QID) | ORAL | Status: DC | PRN
  Filled 2023-01-01: qty 1

## 2023-01-01 MED ORDER — FENTANYL CITRATE (PF) 250 MCG/5ML IJ SOLN
INTRAMUSCULAR | Status: AC
  Filled 2023-01-01: qty 5

## 2023-01-01 MED ORDER — DOCUSATE SODIUM 100 MG PO CAPS
100.0000 mg | ORAL_CAPSULE | Freq: Two times a day (BID) | ORAL | Status: DC
  Administered 2023-01-01 – 2023-01-05 (×7): 100 mg via ORAL
  Filled 2023-01-01 (×9): qty 1

## 2023-01-01 MED ORDER — SUGAMMADEX SODIUM 200 MG/2ML IV SOLN
INTRAVENOUS | Status: DC | PRN
  Administered 2023-01-01: 200 mg via INTRAVENOUS

## 2023-01-01 MED ORDER — HYDROCODONE-ACETAMINOPHEN 7.5-325 MG PO TABS
1.0000 | ORAL_TABLET | ORAL | Status: DC | PRN
  Administered 2023-01-01: 2 via ORAL
  Administered 2023-01-02: 1 via ORAL
  Administered 2023-01-03: 2 via ORAL
  Filled 2023-01-01: qty 1
  Filled 2023-01-01 (×2): qty 2

## 2023-01-01 MED ORDER — METHOCARBAMOL 1000 MG/10ML IJ SOLN: 500.0000 mg | Freq: Four times a day (QID) | INTRAVENOUS | Status: DC | PRN

## 2023-01-01 MED ORDER — POLYETHYLENE GLYCOL 3350 17 G PO PACK: 17.0000 g | PACK | Freq: Every day | ORAL | Status: DC | PRN

## 2023-01-01 MED ORDER — TRANEXAMIC ACID-NACL 1000-0.7 MG/100ML-% IV SOLN
INTRAVENOUS | Status: AC
  Filled 2023-01-01: qty 100

## 2023-01-01 MED ORDER — TRANEXAMIC ACID-NACL 1000-0.7 MG/100ML-% IV SOLN
1000.0000 mg | Freq: Once | INTRAVENOUS | Status: AC
  Administered 2023-01-01: 1000 mg via INTRAVENOUS
  Filled 2023-01-01: qty 100

## 2023-01-01 MED ORDER — PHENYLEPHRINE 80 MCG/ML (10ML) SYRINGE FOR IV PUSH (FOR BLOOD PRESSURE SUPPORT)
PREFILLED_SYRINGE | INTRAVENOUS | Status: DC | PRN
  Administered 2023-01-01 (×2): 160 ug via INTRAVENOUS

## 2023-01-01 MED ORDER — ONDANSETRON HCL 4 MG PO TABS: 4.0000 mg | ORAL_TABLET | Freq: Four times a day (QID) | ORAL | Status: DC | PRN

## 2023-01-01 MED ORDER — ACETAMINOPHEN 325 MG PO TABS: 325.0000 mg | ORAL_TABLET | Freq: Four times a day (QID) | ORAL | Status: DC | PRN

## 2023-01-01 MED ORDER — METOCLOPRAMIDE HCL 5 MG PO TABS: 5.0000 mg | ORAL_TABLET | Freq: Three times a day (TID) | ORAL | Status: DC | PRN

## 2023-01-01 SURGICAL SUPPLY — 54 items
BAG COUNTER SPONGE SURGICOUNT (BAG) ×1 IMPLANT
BLADE SAW SGTL 73X25 THK (BLADE) ×1 IMPLANT
BRUSH SCRUB EZ PLAIN DRY (MISCELLANEOUS) ×2 IMPLANT
COVER SURGICAL LIGHT HANDLE (MISCELLANEOUS) ×1 IMPLANT
DRAPE INCISE IOBAN 85X60 (DRAPES) ×1 IMPLANT
DRAPE ORTHO SPLIT 77X108 STRL (DRAPES) ×2
DRAPE SURG ORHT 6 SPLT 77X108 (DRAPES) ×2 IMPLANT
DRAPE U-SHAPE 47X51 STRL (DRAPES) ×1 IMPLANT
DRSG MEPILEX POST OP 4X12 (GAUZE/BANDAGES/DRESSINGS) IMPLANT
DRSG MEPILEX POST OP 4X8 (GAUZE/BANDAGES/DRESSINGS) ×1 IMPLANT
ELECT BLADE 6.5 EXT (BLADE) IMPLANT
ELECT CAUTERY BLADE 6.4 (BLADE) IMPLANT
ELECT REM PT RETURN 9FT ADLT (ELECTROSURGICAL) ×1
ELECTRODE REM PT RTRN 9FT ADLT (ELECTROSURGICAL) ×1 IMPLANT
GLOVE BIO SURGEON STRL SZ7.5 (GLOVE) ×1 IMPLANT
GLOVE BIO SURGEON STRL SZ8 (GLOVE) ×1 IMPLANT
GLOVE BIOGEL PI IND STRL 8 (GLOVE) ×2 IMPLANT
GLOVE SURG ORTHO LTX SZ7.5 (GLOVE) ×2 IMPLANT
GOWN STRL REUS W/ TWL LRG LVL3 (GOWN DISPOSABLE) ×1 IMPLANT
GOWN STRL REUS W/ TWL XL LVL3 (GOWN DISPOSABLE) ×1 IMPLANT
GOWN STRL REUS W/TWL LRG LVL3 (GOWN DISPOSABLE) ×2
GOWN STRL REUS W/TWL XL LVL3 (GOWN DISPOSABLE) ×1
HANDPIECE INTERPULSE COAX TIP (DISPOSABLE) ×1
HEAD MODULAR ENDO (Orthopedic Implant) ×1 IMPLANT
HEAD UNPLR 49XMDLR STRL HIP (Orthopedic Implant) IMPLANT
KIT BASIN OR (CUSTOM PROCEDURE TRAY) ×1 IMPLANT
KIT TURNOVER KIT B (KITS) ×1 IMPLANT
MANIFOLD NEPTUNE II (INSTRUMENTS) ×1 IMPLANT
NDL 1/2 CIR MAYO (NEEDLE) IMPLANT
NEEDLE 1/2 CIR MAYO (NEEDLE) IMPLANT
NS IRRIG 1000ML POUR BTL (IV SOLUTION) ×1 IMPLANT
PACK TOTAL JOINT (CUSTOM PROCEDURE TRAY) ×1 IMPLANT
PAD ARMBOARD 7.5X6 YLW CONV (MISCELLANEOUS) ×2 IMPLANT
PILLOW ABDUCTION MEDIUM (MISCELLANEOUS) IMPLANT
RETRIEVER SUT HEWSON (MISCELLANEOUS) ×1 IMPLANT
SET HNDPC FAN SPRY TIP SCT (DISPOSABLE) IMPLANT
SLEEVE UNITRAX V40 STD (Orthopedic Implant) IMPLANT
STAPLER VISISTAT 35W (STAPLE) ×1 IMPLANT
STEM HIP 4 127DEG (Stem) IMPLANT
STRIP CLOSURE SKIN 1/2X4 (GAUZE/BANDAGES/DRESSINGS) IMPLANT
SUT ETHILON 2 0 PSLX (SUTURE) ×2 IMPLANT
SUT FIBERWIRE #2 38 T-5 BLUE (SUTURE) ×2
SUT MNCRL AB 3-0 PS2 18 (SUTURE) IMPLANT
SUT VIC AB 0 CT1 36 (SUTURE) IMPLANT
SUT VIC AB 1 CT1 18XCR BRD 8 (SUTURE) ×1 IMPLANT
SUT VIC AB 1 CT1 27 (SUTURE) ×3
SUT VIC AB 1 CT1 27XBRD ANBCTR (SUTURE) ×2 IMPLANT
SUT VIC AB 1 CT1 8-18 (SUTURE) ×1
SUT VIC AB 2-0 CT1 27 (SUTURE) ×2
SUT VIC AB 2-0 CT1 TAPERPNT 27 (SUTURE) ×2 IMPLANT
SUTURE FIBERWR #2 38 T-5 BLUE (SUTURE) ×2 IMPLANT
TOWEL GREEN STERILE (TOWEL DISPOSABLE) ×1 IMPLANT
TOWEL GREEN STERILE FF (TOWEL DISPOSABLE) ×1 IMPLANT
WATER STERILE IRR 1000ML POUR (IV SOLUTION) ×1 IMPLANT

## 2023-01-01 NOTE — Progress Notes (Signed)
Report given to PACU.

## 2023-01-01 NOTE — Anesthesia Preprocedure Evaluation (Addendum)
Anesthesia Evaluation  Patient identified by MRN, date of birth, ID band Patient confused    Reviewed: Allergy & Precautions, Patient's Chart, lab work & pertinent test results  Airway Mallampati: III  TM Distance: >3 FB Neck ROM: Full    Dental  (+) Poor Dentition   Pulmonary former smoker    + decreased breath sounds      Cardiovascular hypertension, + CAD   Rhythm:Regular Rate:Normal     Neuro/Psych   Anxiety    Dementia    GI/Hepatic negative GI ROS, Neg liver ROS,,,  Endo/Other  negative endocrine ROS    Renal/GU negative Renal ROS  negative genitourinary   Musculoskeletal Hip fracture    Abdominal Normal abdominal exam  (+)   Peds  Hematology negative hematology ROS (+)   Anesthesia Other Findings Lovenox 2030 12/31/22, will plan for GA  Reproductive/Obstetrics                              Anesthesia Physical Anesthesia Plan  ASA: 3  Anesthesia Plan: General   Post-op Pain Management:    Induction: Intravenous  PONV Risk Score and Plan: 2 and Ondansetron, Dexamethasone and Treatment may vary due to age or medical condition  Airway Management Planned: Mask and Oral ETT  Additional Equipment: None  Intra-op Plan:   Post-operative Plan: Extubation in OR  Informed Consent: I have reviewed the patients History and Physical, chart, labs and discussed the procedure including the risks, benefits and alternatives for the proposed anesthesia with the patient or authorized representative who has indicated his/her understanding and acceptance.   Patient has DNR.  Discussed DNR with power of attorney.   Dental advisory given, Consent reviewed with POA and History available from chart only  Plan Discussed with:   Anesthesia Plan Comments: (- spoke to spouse pre-operatively to discuss goals of care, amenable to blood products.   Lab Results      Component                Value                Date                      WBC                      11.9 (H)            01/01/2023                HGB                      14.0                01/01/2023                HCT                      44.1                01/01/2023                MCV                      85.0                01/01/2023  PLT                      397                 01/01/2023             Lab Results      Component                Value               Date                      NA                       141                 01/01/2023                K                        3.9                 01/01/2023                CO2                      27                  01/01/2023                GLUCOSE                  129 (H)             01/01/2023                BUN                      23                  01/01/2023                CREATININE               0.87                01/01/2023                CALCIUM                  9.3                 01/01/2023                EGFR                     87                  11/30/2020                GFRNONAA                 >60                 01/01/2023           )        Anesthesia Quick Evaluation

## 2023-01-01 NOTE — Progress Notes (Signed)
Initial Nutrition Assessment  DOCUMENTATION CODES:   Not applicable  INTERVENTION:  - Advance diet as tolerated.   NUTRITION DIAGNOSIS:   Increased nutrient needs related to hip fracture as evidenced by estimated needs.  GOAL:   Patient will meet greater than or equal to 90% of their needs  MONITOR:   PO intake, Diet advancement  REASON FOR ASSESSMENT:   Consult Assessment of nutrition requirement/status  ASSESSMENT:   67 y.o. male admits related to hip pain. PMH includes: Alzheimer's disease, diverticulosis of colon, dyslipidemia. Pt is currently receiving medical management related to left femur fracture.  Meds reviewed: lipitor, senokot. Labs reviewed: WDL.   The pt is currently NPO and in OR. No significant wt loss per record. RD will continue to monitor for diet advancement and attempt to gather nutrition hx details at follow up. Increased nutrient needs related to fracture. Pt is disoriented x4.   NUTRITION - FOCUSED PHYSICAL EXAM:  Assess at follow up - remote assessment.  Diet Order:   Diet Order             Diet NPO time specified  Diet effective midnight                   EDUCATION NEEDS:   Not appropriate for education at this time  Skin:  Skin Assessment: Reviewed RN Assessment  Last BM:  type 5 - 4/6  Height:   Ht Readings from Last 1 Encounters:  01/01/23 6' 0.01" (1.829 m)    Weight:   Wt Readings from Last 1 Encounters:  01/01/23 73 kg    Ideal Body Weight:     BMI:  Body mass index is 21.82 kg/m.  Estimated Nutritional Needs:   Kcal:  1155-2080 kcals  Protein:  105-125 gm  Fluid:  >/= 2.1 L  Bethann Humble, RD, LDN, CNSC.

## 2023-01-01 NOTE — Interval H&P Note (Signed)
History and Physical Interval Note:  01/01/2023 6:52 AM  Paul Klein  has presented today for surgery, with the diagnosis of Left Hip Fracture.  The various methods of treatment have been discussed with the patient and family. After consideration of risks, benefits and other options for treatment, the patient has consented to  Procedure(s): ARTHROPLASTY BIPOLAR HIP (HEMIARTHROPLASTY) (Left) as a surgical intervention.  The patient's history has been reviewed, patient examined, no change in status, stable for surgery.  I have reviewed the patient's chart and labs.  Questions were answered to the patient's satisfaction.     Sheral Apley

## 2023-01-01 NOTE — Progress Notes (Signed)
OT Cancellation Note  Patient Details Name: JADIS PLATE MRN: [id number] DOB: [birth year]   Cancelled Treatment:    Reason Eval/Treat Not Completed: Patient at procedure or test/ unavailable (L hip hemiarthroplasty)  Donia Pounds 01/01/2023, 7:59 AM

## 2023-01-01 NOTE — Progress Notes (Signed)
SLP Cancellation Note  Patient Details Name: Paul Klein MRN: [id number] DOB: 01/30/1956   Cancelled treatment:       Reason Eval/Treat Not Completed: Patient at procedure or test/unavailable;Other (comment) (L hip hemiarthroplasty. SLP will follow for readiness)  Angela Nevin, MA, CCC-SLP Speech Therapy

## 2023-01-01 NOTE — Progress Notes (Signed)
Pt heading to OR

## 2023-01-01 NOTE — Progress Notes (Signed)
NAME:  MASSIAH BULLS, MRN:  [id number], DOB:  Apr 08, 1956, LOS: 2 ADMISSION DATE:  12/30/2022  Subjective  Patient evaluated at bedside in PACU this AM. Does not report any pain, resting comfortably post-operatively.    Objective   Blood pressure 126/75, pulse 84, temperature (!) 97.3 F (36.3 C), resp. rate 15, height 6' 0.01" (1.829 m), weight 73 kg, SpO2 96 %.     Intake/Output Summary (Last 24 hours) at 01/01/2023 1048 Last data filed at 01/01/2023 0931 Gross per 24 hour  Intake 1150 ml  Output 100 ml  Net 1050 ml   Filed Weights   12/30/22 1420 01/01/23 0710  Weight: 73 kg 73 kg   Physical Exam: General: Resting comfortably in bed, no acute distress CV: Tachycardic, regular rhythm. No murmurs. Pulm: Normal work of breathing on room air. Clear to auscultation bilaterally.  Neuro: Awake, alert, grossly non-focal.   Labs       Latest Ref Rng & Units 01/01/2023    3:41 AM 12/31/2022    2:22 AM 12/30/2022    2:59 PM  CBC  WBC 4.0 - 10.5 K/uL 11.9  15.0  17.7   Hemoglobin 13.0 - 17.0 g/dL 37.9  02.4  09.7   Hematocrit 39.0 - 52.0 % 44.1  42.9  47.9   Platelets 150 - 400 K/uL 397  439  534       Latest Ref Rng & Units 01/01/2023    3:41 AM 12/30/2022    2:59 PM 12/28/2022   10:48 AM  BMP  Glucose 70 - 99 mg/dL 353  299  242   BUN 8 - 23 mg/dL 23  21  16    Creatinine 0.61 - 1.24 mg/dL 6.83  4.19  6.22   Sodium 135 - 145 mmol/L 141  135  134   Potassium 3.5 - 5.1 mmol/L 3.9  4.3  3.7   Chloride 98 - 111 mmol/L 104  100  99   CO2 22 - 32 mmol/L 27  23  24    Calcium 8.9 - 10.3 mg/dL 9.3  9.0  8.7     Summary   Sari Petrou is 67yo [name] and hyperlipidemia admitted 4/5 with displaced proximal left femur fracture, now s/p hemiarthroplasty with orthopedics.   Assessment & Plan:  Principal Problem:   Femur fracture, left Active Problems:   Closed fracture of left hip   Dysphagia  #Displaced proximal left femur fracture s/p hemiarthroplasty   Underwent hemiarthroplasty with Dr. Eulah Pont this AM, appreciate their assistance with this case. After discussions with family, appears this surgery aligns with his and his family goals of keeping him as comfortable as possible. While we will engage PT/OT post-operatively, it would be most important to he and his family for him to return to his memory care with maximum physical therapy needs. We will continue to monitor his pain in the meantime.  - Appreciate orthopedics assistance with this case - Tylenol 1000mg  every 6 hours - Ibuprofen 200mg  every 8 hours - Oxycodone 5mg  every 8 hours as needed - Bowel regimen in place - DVT prophylaxis with lovenox - PT/OT after surgery  #Alzheimer's dementia Appears at baseline mental status this morning. Would avoid telemetry and lines as much as possible. Will continue with low-dose clonazepam, avoid other centrally-acting medications.  - Continue clonazepam 0.5mg  QHS - Delirium precautions in place  #Protein-calorie malnutrition Per nursing staff yesterday patient very reluctant to eat food, even with assistance. We have switched his  diet to mechanical soft in the meantime. Plan to have speech evaluate for any dysphagia as well.  - Follow-up SLP recommendations - Follow-up RD recommendations   Best practice:  DIET: Regular IVF: n/a DVT PPX: lovenox BOWEL: senokot, Miralax CODE: DNR FAM COM: discussed with family today, plan to return to memory care unit  Evlyn Kanner, MD Internal Medicine Resident PGY-3 PAGER: [phone] 01/01/2023 10:48 AM  If after hours (below), please contact on-call pager: [phone] 5PM-7AM Monday-Friday 1PM-7AM Saturday-Sunday

## 2023-01-01 NOTE — Transfer of Care (Signed)
Immediate Anesthesia Transfer of Care Note  Patient: Paul Klein  Procedure(s) Performed: ARTHROPLASTY BIPOLAR HIP (HEMIARTHROPLASTY) (Left: Hip)  Patient Location: PACU  Anesthesia Type:General  Level of Consciousness: drowsy and confused  Airway & Oxygen Therapy: Patient Spontanous Breathing  Post-op Assessment: Report given to RN and Post -op Vital signs reviewed and stable  Post vital signs: Reviewed and stable  Last Vitals:  Vitals Value Taken Time  BP 159/89 01/01/23 0939  Temp    Pulse 85 01/01/23 0942  Resp 20 01/01/23 0942  SpO2 96 % 01/01/23 0942  Vitals shown include unvalidated device data.  Last Pain:  Vitals:   01/01/23 0602  TempSrc: Oral  PainSc:          Complications: No notable events documented.

## 2023-01-01 NOTE — Anesthesia Postprocedure Evaluation (Signed)
Anesthesia Post Note  Patient: Paul Klein  Procedure(s) Performed: ARTHROPLASTY BIPOLAR HIP (HEMIARTHROPLASTY) (Left: Hip)     Patient location during evaluation: PACU Anesthesia Type: General Level of consciousness: awake and alert Pain management: pain level controlled Vital Signs Assessment: post-procedure vital signs reviewed and stable Respiratory status: spontaneous breathing, nonlabored ventilation, respiratory function stable and patient connected to nasal cannula oxygen Cardiovascular status: blood pressure returned to baseline and stable Postop Assessment: no apparent nausea or vomiting Anesthetic complications: no   No notable events documented.  Last Vitals:  Vitals:   01/01/23 1233 01/01/23 1620  BP: 121/75 (!) 148/88  Pulse: 88   Resp: 20   Temp: 36.7 C   SpO2: 98%     Last Pain:  Vitals:   01/01/23 1100  TempSrc:   PainSc: 0-No pain                 Earl Lites P Gera Inboden

## 2023-01-01 NOTE — Op Note (Signed)
12/30/2022 - 01/01/2023  8:44 AM  PATIENT:  Paul Klein   MRN: [id number]  PRE-OPERATIVE DIAGNOSIS:  Left Hip Fracture  POST-OPERATIVE DIAGNOSIS:  Left Hip Fracture  PROCEDURE:  Procedure(s): ARTHROPLASTY BIPOLAR HIP (HEMIARTHROPLASTY)  PREOPERATIVE INDICATIONS:  HOLLIS MACEWAN is an 67 y.o. male who was admitted 12/30/2022 with a diagnosis of Femur fracture, left and elected for surgical management.  The risks benefits and alternatives were discussed with the patient including but not limited to the risks of nonoperative treatment, versus surgical intervention including infection, bleeding, nerve injury, periprosthetic fracture, the need for revision surgery, dislocation, leg length discrepancy, blood clots, cardiopulmonary complications, morbidity, mortality, among others, and they were willing to proceed.  Predicted outcome is good, although there will be at least a six to nine month expected recovery.   OPERATIVE REPORT     SURGEON:  Margarita Rana, MD    ASSISTANT:  Levester Fresh, PA-C, he was present and scrubbed throughout the case, critical for completion in a timely fashion, and for retraction, instrumentation, and closure.     ANESTHESIA:  General    COMPLICATIONS:  None.      COMPONENTS:  [employer] Acol[employer]: Femoral stem: 4, Femoral Head:49, Neck:0   PROCEDURE IN DETAIL: The patient was met in the holding area and identified.  The appropriate hip  was marked at the operative site. The patient was then transported to the OR and  placed under general anesthesia.  At that point, the patient was  placed in the lateral decubitus position with the operative side up and  secured to the operating room table and all bony prominences padded.     The operative lower extremity was prepped from the iliac crest to the toes.  Sterile draping was performed.  Time out was performed prior to incision.      A routine posterolateral approach was utilized via sharp dissection  carried down to the  subcutaneous tissue.  Gross bleeders were Bovie  coagulated.  The iliotibial band was identified and incised  along the length of the skin incision.  Self-retaining retractors were  inserted. I examined the bursa there was significant hematoma and edema I performed a bursectomy here.  With the hip internally rotated, the short external rotators  were identified. The piriformis was tagged with FiberWire, and the hip capsule released in a T-type fashion.  The femoral neck was exposed, and I resected the femoral neck using the appropriate jig. This was performed at approximately a thumb's breadth above the lesser trochanter.    I then exposed the deep acetabulum, cleared out any tissue including the ligamentum teres, and included the hip capsule in the FiberWire used above and below the T.    I then prepared the proximal femur using the cookie-cutter, the lateralizing reamer, and then sequentially broached.  A trial utilized, and I reduced the hip and it was found to have excellent stability with functional range of motion. The trial components were then removed.   The canal and acetabulum were thoroughly irrigated  I inserted the pressfit stem and placed the head and neck collar. The hip was reduced with appropriate force and was stable through a range of motion.   I then used a 2 mm drill bits to pass the FiberWire suture from the capsule and puriform is through the greater trochanter, and secured this. Excellent posterior capsular repair was achieved. I also closed the T in the capsule.  I then irrigated the hip copiously again with pulse lavage,  and repaired the fascia with Vicryl, followed by Vicryl for the subcutaneous tissue, Monocryl for the skin, Steri-Strips and sterile gauze. The wounds were injected. The patient was then awakened and returned to PACU in stable and satisfactory condition. There were no complications.  POST-OP PLAN: Weight bearing as tolerated. DVT px will consist of SCD's  and chemical px  Margarita Rana, MD Orthopedic Surgeon [phone]   01/01/2023 8:44 AM

## 2023-01-01 NOTE — Anesthesia Procedure Notes (Signed)
Procedure Name: Intubation Date/Time: 01/01/2023 7:50 AM  Performed by: Dairl Ponder, CRNAPre-anesthesia Checklist: Patient identified, Emergency Drugs available, Suction available and Patient being monitored Patient Re-evaluated:Patient Re-evaluated prior to induction Oxygen Delivery Method: Circle System Utilized Preoxygenation: Pre-oxygenation with 100% oxygen Induction Type: IV induction Ventilation: Mask ventilation without difficulty Laryngoscope Size: Mac and 4 Grade View: Grade I Tube type: Oral Tube size: 7.5 mm Number of attempts: 1 Airway Equipment and Method: Stylet and Oral airway Placement Confirmation: ETT inserted through vocal cords under direct vision, positive ETCO2 and breath sounds checked- equal and bilateral Secured at: 23 cm Tube secured with: Tape Dental Injury: Teeth and Oropharynx as per pre-operative assessment

## 2023-01-02 DIAGNOSIS — F0280 Dementia in other diseases classified elsewhere without behavioral disturbance: Secondary | ICD-10-CM | POA: Diagnosis not present

## 2023-01-02 DIAGNOSIS — G309 Alzheimer's disease, unspecified: Secondary | ICD-10-CM | POA: Diagnosis not present

## 2023-01-02 DIAGNOSIS — S7292XA Unspecified fracture of left femur, initial encounter for closed fracture: Secondary | ICD-10-CM

## 2023-01-02 LAB — CBC
HCT: 38.7 % — ABNORMAL LOW (ref 39.0–52.0)
Hemoglobin: 12.4 g/dL — ABNORMAL LOW (ref 13.0–17.0)
MCH: 26.7 pg (ref 26.0–34.0)
MCHC: 32 g/dL (ref 30.0–36.0)
MCV: 83.2 fL (ref 80.0–100.0)
Platelets: 410 10*3/uL — ABNORMAL HIGH (ref 150–400)
RBC: 4.65 MIL/uL (ref 4.22–5.81)
RDW: 13.9 % (ref 11.5–15.5)
WBC: 11.9 10*3/uL — ABNORMAL HIGH (ref 4.0–10.5)
nRBC: 0 % (ref 0.0–0.2)

## 2023-01-02 MED ORDER — ACETAMINOPHEN 500 MG PO TABS
500.0000 mg | ORAL_TABLET | Freq: Four times a day (QID) | ORAL | Status: AC
  Administered 2023-01-02 – 2023-01-03 (×3): 500 mg via ORAL
  Filled 2023-01-02 (×3): qty 1

## 2023-01-02 NOTE — Evaluation (Signed)
Occupational Therapy Evaluation Patient Details Name: Paul Klein E Bantz MRN: [id number] DOB: 02/28/1956 Today's Date: 01/02/2023   History of Present Illness Pt is a 67 y/o M s/p Hemiarthroplasty L hip - WBAT. PMH includes Alzheimer's dementia, multiple falls, and malnutrition.   Clinical Impression   Paul Klein was evaluated s/p the above admission list. He is from SNF and requires assist for ADLs at baseline, per family, pt was walking without assist about 1.5 weeks ago.[name]. Upon evaluation he was limited by pain, cognition, weakness, anxiety and activity tolerance. Overall he currently requires total A for all aspects of his care. Pt will benefit from continued acute OT services. Pt will benefit from skilled inpatient follow up therapy, <3 hours/day.       Recommendations for follow up therapy are one component of a multi-disciplinary discharge planning process, led by the attending physician.  Recommendations may be updated based on patient status, additional functional criteria and insurance authorization.   Assistance Recommended at Discharge Frequent or constant Supervision/Assistance  Patient can return home with the following Two people to help with walking and/or transfers;Two people to help with bathing/dressing/bathroom;Assistance with cooking/housework;Help with stairs or ramp for entrance;Assist for transportation;Direct supervision/assist for financial management;Direct supervision/assist for medications management;Assistance with feeding    Functional Status Assessment  Patient has had a recent decline in their functional status and demonstrates the ability to make significant improvements in function in a reasonable and predictable amount of time.  Equipment Recommendations  Other (comment)       Precautions / Restrictions Precautions Precautions: Fall Precaution Comments: posterior hip precautions Restrictions Weight Bearing Restrictions: No LLE Weight Bearing: Weight bearing as  tolerated      Mobility Bed Mobility Overal bed mobility: Needs Assistance Bed Mobility: Rolling Rolling: Total assist         General bed mobility comments: rolled L&R for linen change, actively resisting    Transfers                   General transfer comment: unsafe to attempt      Balance Overall balance assessment: Needs assistance, History of Falls     Sitting balance - Comments: unable to assess                                   ADL either performed or assessed with clinical judgement   ADL Overall ADL's : Needs assistance/impaired   Eating/Feeding Details (indicate cue type and reason): pt's dtr present and reports she fed pt and he ate ~50% of the meal                                   General ADL Comments: pt is total A for all aspects of care at bed level at this time     Vision Baseline Vision/History: 0 No visual deficits Additional Comments: limited assessment, but attending visual to stim     Perception Perception Perception Tested?: No   Praxis Praxis Praxis tested?: Not tested    Pertinent Vitals/Pain Pain Assessment Pain Assessment: Faces Faces Pain Scale: Hurts little more Pain Location: L hip Pain Descriptors / Indicators: Discomfort, Grimacing, Guarding, Moaning Pain Intervention(s): Limited activity within patient's tolerance, Monitored during session     Hand Dominance Right   Extremity/Trunk Assessment Upper Extremity Assessment Upper Extremity Assessment: Difficult to assess due to impaired cognition (  pt actively resisting movement with good strength)   Lower Extremity Assessment Lower Extremity Assessment: Defer to PT evaluation   Cervical / Trunk Assessment Cervical / Trunk Assessment: Kyphotic   Communication Communication Communication: Expressive difficulties   Cognition Arousal/Alertness: Awake/alert Behavior During Therapy: Flat affect, Anxious Overall Cognitive Status:  History of cognitive impairments - at baseline                                 General Comments: not following commands or answering questions. actively resisting movement. Pt's family present and reports at baseline he knows his name and recognizes family. He does not make his needs known the majority of time     General Comments  VSS, family present    Exercises     Shoulder Instructions      Home Living Family/patient expects to be discharged to:: Skilled nursing facility                                 Additional Comments: [facility] home for ~1.5 years (family present and reporting)      Prior Functioning/Environment Prior Level of Function : Needs assist             Mobility Comments: Per family, >1 week ago he was getting up with assist and walking (shuffled) without AD. Reporting at least 3 falls in 1 week ADLs Comments: Per family, he requires assist for all aspects of care. Does not always express his needs        OT Problem List: Decreased strength;Decreased activity tolerance;Impaired balance (sitting and/or standing);Pain;Decreased cognition      OT Treatment/Interventions: Self-care/ADL training;DME and/or AE instruction;Therapeutic activities;Balance training;Patient/family education    OT Goals(Current goals can be found in the care plan section) Acute Rehab OT Goals Patient Stated Goal: less pain OT Goal Formulation: Patient unable to participate in goal setting Time For Goal Achievement: 01/16/23 Potential to Achieve Goals: Good ADL Goals Pt Will Perform Grooming: with mod assist;sitting Pt Will Perform Upper Body Dressing: with min assist;sitting Pt Will Perform Lower Body Dressing: with mod assist;sit to/from stand Pt Will Transfer to Toilet: with mod assist;stand pivot transfer;bedside commode  OT Frequency: Min 2X/week    Co-evaluation              AM-PAC OT "6 Clicks" Daily Activity     Outcome Measure Help  from another person eating meals?: Total Help from another person taking care of personal grooming?: Total Help from another person toileting, which includes using toliet, bedpan, or urinal?: Total Help from another person bathing (including washing, rinsing, drying)?: Total Help from another person to put on and taking off regular upper body clothing?: Total Help from another person to put on and taking off regular lower body clothing?: Total 6 Click Score: 6   End of Session Nurse Communication: Mobility status  Activity Tolerance: Patient tolerated treatment well Patient left: in bed;with call bell/phone within reach;with bed alarm set;with restraints reapplied  OT Visit Diagnosis: Other abnormalities of gait and mobility (R26.89);Muscle weakness (generalized) (M62.81);Pain;History of falling (Z91.81)                Time: 6384-6659 OT Time Calculation (min): 19 min Charges:  OT General Charges $OT Visit: 1 Visit OT Evaluation $OT Eval Moderate Complexity: 1 Mod  Derenda Mis, OTR/L Acute Rehabilitation Services Office [phone] Secure Chat Communication  Preferred   Donia Pounds 01/02/2023, 3:55 PM

## 2023-01-02 NOTE — Evaluation (Addendum)
Physical Therapy Evaluation Patient Details Name: Paul Klein MRN: [id number] DOB: 03/07/1956 Today's Date: 01/02/2023  History of Present Illness  Pt is a 67 y/o M s/p Hemiarthroplasty L hip - WBAT. PMH includes Alzheimer's dementia, multiple falls, and malnutrition.  Clinical Impression  Received pt semi-reclined in bed with no family present at bedside. Pt demonstrating cognitive impairments - unable to state name, situation, or location and unable to provide any subjective history. Per RN, pt has wife and daughter but unsure amount of assist they are able to provide. Pt did not attempt to initiate any movement with bed mobility, ultimately requiring total A to advance LEs off EOB. Upon therapist moving pt's LEs, pt became agitated and upset yelling nonpurposeful jargon but did state "no, I don't wanna"; therefore did not push any further participation to avoid further agitating pt. Pt will benefit from continued PT service to address functional impairments and strength deficits.      Recommendations for follow up therapy are one component of a multi-disciplinary discharge planning process, led by the attending physician.  Recommendations may be updated based on patient status, additional functional criteria and insurance authorization.  Follow Up Recommendations Can patient physically be transported by private vehicle: No     Assistance Recommended at Discharge Frequent or constant Supervision/Assistance  Patient can return home with the following  Two people to help with walking and/or transfers;Two people to help with bathing/dressing/bathroom;Direct supervision/assist for financial management;Direct supervision/assist for medications management;Assist for transportation;Assistance with cooking/housework;Assistance with feeding;Help with stairs or ramp for entrance    Equipment Recommendations None recommended by PT (TBD)  Recommendations for Other Services       Functional Status  Assessment Patient has had a recent decline in their functional status and/or demonstrates limited ability to make significant improvements in function in a reasonable and predictable amount of time     Precautions / Restrictions Precautions Precautions: Fall Precaution Comments: posterior hip precautions Restrictions Weight Bearing Restrictions: No LLE Weight Bearing: Weight bearing as tolerated      Mobility  Bed Mobility Overal bed mobility: Needs Assistance Bed Mobility: Rolling Rolling: Total assist         General bed mobility comments: Pt not initiating any movement. Pt required total A to begin to advance BLE off EOB, then became agitated and yelling nonpurposeful jargon, but did clearly state "no, I don't want to" - therefore did not proceed to avoid further agitating pt. Patient Response:  (agitated)  Transfers                   General transfer comment: pt refused    Ambulation/Gait               General Gait Details: pt refused  Stairs            Wheelchair Mobility    Modified Rankin (Stroke Patients Only)       Balance Overall balance assessment: Needs assistance, History of Falls     Sitting balance - Comments: pt refused to sit EOB       Standing balance comment: pt refused                             Pertinent Vitals/Pain Pain Assessment Pain Assessment: Faces Facial Expression: sad, frightened, frown Body Language: tense, distressed pacing, fidgeting Pain Location: L hip Pain Descriptors / Indicators: Discomfort, Grimacing, Guarding, Moaning Pain Intervention(s): Limited activity within patient's tolerance, Monitored during  session, Repositioned    Home Living Family/patient expects to be discharged to:: Skilled nursing facility                   Additional Comments: Per RN, pt admitted from [facility]. Pt unable to answer any subjective history questions and speaking nonpurposeful jargon  during session.    Prior Function Prior Level of Function : Patient poor historian/Family not available             Mobility Comments: unknown - pt unable to provide information ADLs Comments: unknown - pt unable to provide information     Hand Dominance        Extremity/Trunk Assessment   Upper Extremity Assessment Upper Extremity Assessment: Defer to OT evaluation    Lower Extremity Assessment Lower Extremity Assessment: Generalized weakness LLE Deficits / Details: assessment limited by pt guarding/resisting movement       Communication   Communication: Expressive difficulties;Receptive difficulties;HOH  Cognition Arousal/Alertness: Awake/alert Behavior During Therapy: Flat affect Overall Cognitive Status: No family/caregiver present to determine baseline cognitive functioning                                 General Comments: pt unable to follow simple one step commands, responding inconsistantly to yes/no questions. Unable to state his full name, location/situation but did become agitated with therapist provided assist with movement.        General Comments General comments (skin integrity, edema, etc.): pt did state "hungry" and therapist enocuraged transferring to recliner to eat, however upon initiating movement pt became agitated and refused to sit EOB. Attempted to assist with feeding from bed level, however pt demonstrating poor sequencing/motor planning and unable to open mouth    Exercises     Assessment/Plan    PT Assessment Patient needs continued PT services  PT Problem List Decreased strength;Decreased mobility;Decreased activity tolerance;Decreased balance;Decreased knowledge of use of DME;Pain;Decreased cognition;Decreased range of motion;Decreased coordination;Decreased safety awareness;Decreased knowledge of precautions       PT Treatment Interventions Therapeutic activities;DME instruction;Therapeutic exercise;Patient/family  education;Balance training;Functional mobility training;Neuromuscular re-education;Gait training    PT Goals (Current goals can be found in the Care Plan section)  Acute Rehab PT Goals Patient Stated Goal: unable to state PT Goal Formulation: Patient unable to participate in goal setting Time For Goal Achievement: 01/09/23 Potential to Achieve Goals: Fair    Frequency Min 3X/week     Co-evaluation               AM-PAC PT "6 Clicks" Mobility  Outcome Measure Help needed turning from your back to your side while in a flat bed without using bedrails?: Total Help needed moving from lying on your back to sitting on the side of a flat bed without using bedrails?: Total Help needed moving to and from a bed to a chair (including a wheelchair)?: Total Help needed standing up from a chair using your arms (e.g., wheelchair or bedside chair)?: Total Help needed to walk in hospital room?: Total Help needed climbing 3-5 steps with a railing? : Total 6 Click Score: 6    End of Session   Activity Tolerance: Patient limited by pain;Treatment limited secondary to agitation;Other (comment) (impaired cognition) Patient left: in bed;with call bell/phone within reach;with bed alarm set Nurse Communication: Other (comment) PT Visit Diagnosis: Other abnormalities of gait and mobility (R26.89);Unsteadiness on feet (R26.81);Pain;Muscle weakness (generalized) (M62.81) Pain - Right/Left: Left Pain - part  of body: Hip    Time: 4431-5400 PT Time Calculation (min) (ACUTE ONLY): 16 min   Charges:   PT Evaluation $PT Eval Moderate Complexity: 1 Mod          Raechel Chute PT, DPT  Alfonso Patten 01/02/2023, 9:53 AM

## 2023-01-02 NOTE — Evaluation (Signed)
Clinical/Bedside Swallow Evaluation Patient Details  Name: Paul Klein MRN: [id number] Date of Birth: [birth year]  Today's Date: 01/02/2023 Time: SLP Start Time (ACUTE ONLY): 4696 SLP Stop Time (ACUTE ONLY): 0858 SLP Time Calculation (min) (ACUTE ONLY): 16 min  Past Medical History:  Past Medical History:  Diagnosis Date   Alzheimer's disease    Anxiety    Dr. Evelene Croon   Diverticulosis of colon    sigmoid, per colonoscopy 2014, Dr. Elnoria Howard   Dyslipidemia    Hemorrhoids    internal and externa per 2014 colonoscopy   Insomnia    uses Neurontin ( Dr. Evelene Croon)   Normal cardiac stress test 12/2012   performed due to risk factors, abnormal EKG; Dr. Donnie Aho   Panic attack    Second hand smoke exposure    wife   Past Surgical History:  Past Surgical History:  Procedure Laterality Date   ANAL FISSURE REPAIR     COLONOSCOPY  02/19/13   diverticulosis of sigmoid colon, external and internal hemorrhoids, Dr. Elnoria Howard, repeat in 10 years   SKIN BIOPSY     face, back   WISDOM TOOTH EXTRACTION     HPI:  Paul Klein is 67 yo [name] and hyperlipidemia admitted 4/5 with displaced proximal left femur fracture, now s/p hemiarthroplasty with orthopedics.    Assessment / Plan / Recommendation  Clinical Impression  Pt presents with a cognitive dysphagia affecting the oral phase. He was unable to follow commands for full oromotor assessment. Dentition observed and appears to have most however unable to see posterior. He could not cough or swallow on command. There was prolonged mastication with solid (graham cracker and sausage) greater than 5 min. He was able to suck thin liquids via straw without signs of aspiration consistently throughout assessment. SLP recommends upgrading liquids to thin and downgrading texture to puree. He is at greater aspiration risk given cognitive impairments. No family at bedside however RN reported daughter is busy this morning and will come around lunch  time and will update her re: recommendations. ST will follow up briefly. SLP Visit Diagnosis: Dysphagia, oral phase (R13.11)    Aspiration Risk  Mild aspiration risk    Diet Recommendation Dysphagia 1 (Puree);Thin liquid   Liquid Administration via: Straw Medication Administration: Crushed with puree Supervision: Staff to assist with self feeding;Full supervision/cueing for compensatory strategies Compensations: Slow rate;Small sips/bites;Lingual sweep for clearance of pocketing;Minimize environmental distractions Postural Changes: Seated upright at 90 degrees    Other  Recommendations Oral Care Recommendations: Oral care BID    Recommendations for follow up therapy are one component of a multi-disciplinary discharge planning process, led by the attending physician.  Recommendations may be updated based on patient status, additional functional criteria and insurance authorization.  Follow up Recommendations No SLP follow up      Assistance Recommended at Discharge    Functional Status Assessment Patient has had a recent decline in their functional status and demonstrates the ability to make significant improvements in function in a reasonable and predictable amount of time.  Frequency and Duration min 1 x/week  2 weeks       Prognosis Prognosis for improved oropharyngeal function: Fair Barriers to Reach Goals: Cognitive deficits      Swallow Study   General Date of Onset: 12/30/22 HPI: Paul Klein is 67 yo [name] and hyperlipidemia admitted 4/5 with displaced proximal left femur fracture, now s/p hemiarthroplasty with orthopedics. Type of Study: Bedside Swallow Evaluation Previous Swallow  Assessment:  (none) Diet Prior to this Study: Dysphagia 3 (mechanical soft);Mildly thick liquids (Level 2, nectar thick) Temperature Spikes Noted: No Respiratory Status: Room air History of Recent Intubation: No Behavior/Cognition: Alert;Requires  cueing;Confused Oral Cavity Assessment: Dry Oral Care Completed by SLP: Yes Oral Cavity - Dentition: Adequate natural dentition Vision: Functional for self-feeding Self-Feeding Abilities: Needs assist Patient Positioning: Upright in bed Baseline Vocal Quality: Normal Volitional Cough: Cognitively unable to elicit Volitional Swallow: Unable to elicit    Oral/Motor/Sensory Function Overall Oral Motor/Sensory Function:  (would not follow commands- appears WFL)   Ice Chips Ice chips: Not tested   Thin Liquid Thin Liquid: Within functional limits Presentation: Straw    Nectar Thick Nectar Thick Liquid: Not tested   Honey Thick Honey Thick Liquid: Not tested   Puree Puree: Within functional limits   Solid     Solid: Impaired Oral Phase Impairments: Reduced lingual movement/coordination Oral Phase Functional Implications:  (prolonged mastication) Pharyngeal Phase Impairments:  (none)      Paul Klein, Paul Klein 01/02/2023,9:10 AM

## 2023-01-02 NOTE — TOC Initial Note (Signed)
Transition of Care [facility]) - Initial/Assessment Note    Patient Details  Name: Paul Klein MRN: [id number] Date of Birth: 24-May-1956  Transition of Care [facility]) CM/SW Contact:    Lorri Frederick, LCSW Phone Number: 01/02/2023, 3:33 PM  Clinical Narrative:    Pt oriented x1, CSW spoke with pt wife Sedalia Muta and daughter [name], brother Landry Mellow also present.  Pt is from [employer] care.  Discussed PT recommendations for SNF, they are agreeable to SNF, medicare choice document provided.  They will communicate with [facility] regarding plan for SNF.  Referral sent out in hub for SNF. PASSR will need additional info.                 Expected Discharge Plan: Skilled Nursing Facility Barriers to Discharge: Continued Medical Work up, SNF Pending bed offer   Patient Goals and CMS Choice   CMS Medicare.gov Compare Post Acute Care list provided to:: Patient Represenative (must comment) (wife Diane, daughter [name]) Choice offered to / list presented to : Spouse      Expected Discharge Plan and Services In-house Referral: Clinical Social Work   Post Acute Care Choice: Skilled Nursing Facility Living arrangements for the past 2 months: Assisted Living Facility ([name] memory care)                                      Prior Living Arrangements/Services Living arrangements for the past 2 months: Assisted Living Facility ([name] memory care) Lives with:: Facility Resident Patient language and need for interpreter reviewed:: No        Need for Family Participation in Patient Care: Yes (Comment)   Current home services: Other (comment) (na) Criminal Activity/Legal Involvement Pertinent to Current Situation/Hospitalization: No - Comment as needed  Activities of Daily Living      Permission Sought/Granted                  Emotional Assessment Appearance:: Appears stated age Attitude/Demeanor/Rapport: Unable to Assess Affect (typically  observed): Unable to Assess Orientation: : Oriented to Self      Admission diagnosis:  Closed fracture of left hip, initial encounter [S72.002A] S/p left hip fracture [Z87.81] Patient Active Problem List   Diagnosis Date Noted   Dysphagia 01/01/2023   Femur fracture, left 12/31/2022   Closed fracture of left hip 12/31/2022   S/p left hip fracture 12/30/2022   Alzheimer's dementia without behavioral disturbance, psychotic disturbance, mood disturbance, or anxiety 12/26/2022   Encephalopathy 12/25/2022   Weakness 12/25/2022   UTI (urinary tract infection) 12/25/2022   Dementia with behavioral disturbance 07/09/2021   External hemorrhoid 06/02/2021   Hallucination 06/02/2021   Anxiety 06/02/2021   Rectal discomfort 06/02/2021   Advance directive discussed with patient 01/25/2021   Need for pneumococcal vaccination 01/25/2021   Hyperlipidemia 11/30/2020   Encounter for health maintenance examination in adult 11/30/2020   Medicare annual wellness visit, subsequent 11/30/2020   Alzheimer disease 11/30/2020   Abnormal albumin 07/07/2020   Impaired fasting blood sugar 07/07/2020   Atherosclerosis of coronary artery of native heart without angina pectoris 07/07/2020   Aortic atherosclerosis 07/07/2020   Essential hypertension, benign 04/29/2020   Urinary frequency 04/29/2020   Overactive bladder 04/29/2020   Benign prostatic hyperplasia with urinary frequency 04/29/2020   Screen for colon cancer 04/29/2020   Dementia without behavioral disturbance 04/15/2020   Abdominal discomfort 04/15/2020   Frequent urination 04/15/2020  Constipation 10/15/2019   Bradycardia 07/18/2018   Onychomycosis 08/30/2016   Insomnia 08/03/2015   Generalized anxiety disorder 08/03/2015   Screening for prostate cancer 08/03/2015   Need for influenza vaccination 08/03/2015   Vaccine counseling 08/03/2015   Second hand tobacco smoke exposure 07/31/2014   Dyslipidemia 07/31/2014   PCP:  System,  Provider Not In Pharmacy:   [id number] - [place], [place] - [name] [facility] [name] [place] [address] Phone: [phone] Fax: [phone]  [facility] [address] [place] [address] Phone: [phone] Fax: [phone]     Social Determinants of Health (SDOH) Social History: SDOH Screenings   Depression (PHQ2-9): Low Risk  (06/02/2021)  Tobacco Use: Medium Risk (01/01/2023)   SDOH Interventions:     Readmission Risk Interventions     No data to display

## 2023-01-02 NOTE — Progress Notes (Signed)
    Subjective: Patient unable to provide much to exam. When I asked if in pain he said no.   Objective:   VITALS:   Vitals:   01/01/23 1620 01/01/23 1946 01/02/23 0323 01/02/23 0715  BP: (!) 148/88 132/70 (!) 142/80 (!) 141/71  Pulse:  88 76 72  Resp:  18 18 15   Temp:  97.9 F (36.6 C) 97.9 F (36.6 C) 98 F (36.7 C)  TempSrc:  Oral Oral Oral  SpO2:  94% 96% 98%  Weight:      Height:          Latest Ref Rng & Units 01/02/2023    2:57 AM 01/01/2023    3:41 AM 12/31/2022    2:22 AM  CBC  WBC 4.0 - 10.5 K/uL 11.9  11.9  15.0   Hemoglobin 13.0 - 17.0 g/dL 38.3  81.8  40.3   Hematocrit 39.0 - 52.0 % 38.7  44.1  42.9   Platelets 150 - 400 K/uL 410  397  439       Latest Ref Rng & Units 01/01/2023    3:41 AM 12/30/2022    2:59 PM 12/28/2022   10:48 AM  BMP  Glucose 70 - 99 mg/dL 754  360  677   BUN 8 - 23 mg/dL 23  21  16    Creatinine 0.61 - 1.24 mg/dL 0.34  0.35  2.48   Sodium 135 - 145 mmol/L 141  135  134   Potassium 3.5 - 5.1 mmol/L 3.9  4.3  3.7   Chloride 98 - 111 mmol/L 104  100  99   CO2 22 - 32 mmol/L 27  23  24    Calcium 8.9 - 10.3 mg/dL 9.3  9.0  8.7    Intake/Output      04/07 0701 04/08 0700 04/08 0701 04/09 0700   P.O.     I.V. (mL/kg) 900 (12.3)    Other     IV Piggyback 100    Total Intake(mL/kg) 1000 (13.7)    Urine (mL/kg/hr) 750 (0.4)    Blood 100    Total Output 850    Net +150            Physical Exam: General: NAD.  Laying in bed, calm Resp: No increased wob Cardio: regular rate and rhythm ABD soft Neurologically intact MSK Neurovascularly intact Sensation intact distally Intact pulses distally Dorsiflexion/Plantar flexion intact Incision: dressing C/D/I   Assessment: 1 Day Post-Op  S/P Procedure(s) (LRB): ARTHROPLASTY BIPOLAR HIP (HEMIARTHROPLASTY) (Left) by Dr. Jewel Baize. Eulah Pont on 01/01/23  Principal Problem:   Femur fracture, left Active Problems:   Closed fracture of left hip   Dysphagia   Plan: Work with PT/OT as  able  Incentive Spirometry Elevate and Apply ice  Weightbearing: WBAT LLE Insicional and dressing care: Dressings left intact until follow-up and Reinforce dressings as needed Orthopedic device(s): None Showering: Keep dressing dry VTE prophylaxis: Lovenox 40mg  qd , SCDs, ambulation Pain control: minimize narcotics as able d/t dementia Follow - up plan: 2 weeks Contact information:  Margarita Rana MD, Levester Fresh PA-C  Dispo:  TBD based on PT/OT evals.       Jenne Pane, PA-C Office [phone] 01/02/2023, 9:25 AM

## 2023-01-02 NOTE — Progress Notes (Signed)
RE:  Paul Klein       Date of Birth: 02/01/1956      Date:   01/02/23       To Whom It May Concern:  Please be advised that the above-named patient will require a short-term nursing home stay - anticipated 30 days or less for rehabilitation and strengthening.  The plan is for return home.                 MD signature                Date

## 2023-01-02 NOTE — [place] FL2 (Signed)
[place] MEDICAID FL2 LEVEL OF CARE FORM     IDENTIFICATION  Patient Name: Paul Klein Birthdate: 1955-11-13 Sex: male Admission Date (Current Location): 12/30/2022  [place] and [place] Number:  Haynes Bast [id number] O Facility and Address:  [facility] [place], [address] [place], [place] [address]      Provider Number: [id number]  Attending Physician Name and Address:  Dickie La, MD  Relative Name and Phone Number:  Friedt,Dianne Spouse  [phone]    Current Level of Care: Hospital Recommended Level of Care: Skilled Nursing Facility Prior Approval Number:    Date Approved/Denied:   PASRR Number:    Discharge Plan: SNF    Current Diagnoses: Patient Active Problem List   Diagnosis Date Noted   Dysphagia 01/01/2023   Femur fracture, left 12/31/2022   Closed fracture of left hip 12/31/2022   S/p left hip fracture 12/30/2022   Alzheimer's dementia without behavioral disturbance, psychotic disturbance, mood disturbance, or anxiety 12/26/2022   Encephalopathy 12/25/2022   Weakness 12/25/2022   UTI (urinary tract infection) 12/25/2022   Dementia with behavioral disturbance 07/09/2021   External hemorrhoid 06/02/2021   Hallucination 06/02/2021   Anxiety 06/02/2021   Rectal discomfort 06/02/2021   Advance directive discussed with patient 01/25/2021   Need for pneumococcal vaccination 01/25/2021   Hyperlipidemia 11/30/2020   Encounter for health maintenance examination in adult 11/30/2020   Medicare annual wellness visit, subsequent 11/30/2020   Alzheimer disease 11/30/2020   Abnormal albumin 07/07/2020   Impaired fasting blood sugar 07/07/2020   Atherosclerosis of coronary artery of native heart without angina pectoris 07/07/2020   Aortic atherosclerosis 07/07/2020   Essential hypertension, benign 04/29/2020   Urinary frequency 04/29/2020   Overactive bladder 04/29/2020   Benign prostatic hyperplasia with urinary frequency  04/29/2020   Screen for colon cancer 04/29/2020   Dementia without behavioral disturbance 04/15/2020   Abdominal discomfort 04/15/2020   Frequent urination 04/15/2020   Constipation 10/15/2019   Bradycardia 07/18/2018   Onychomycosis 08/30/2016   Insomnia 08/03/2015   Generalized anxiety disorder 08/03/2015   Screening for prostate cancer 08/03/2015   Need for influenza vaccination 08/03/2015   Vaccine counseling 08/03/2015   Second hand tobacco smoke exposure 07/31/2014   Dyslipidemia 07/31/2014    Orientation RESPIRATION BLADDER Height & Weight     Self  Normal Incontinent, External catheter Weight: 160 lb 15 oz (73 kg) Height:  6' 0.01" (182.9 cm)  BEHAVIORAL SYMPTOMS/MOOD NEUROLOGICAL BOWEL NUTRITION STATUS      Incontinent Diet (see discharge summary)  AMBULATORY STATUS COMMUNICATION OF NEEDS Skin   Total Care Verbally Surgical wounds                       Personal Care Assistance Level of Assistance  Total care Bathing Assistance: Maximum assistance Feeding assistance: Maximum assistance Dressing Assistance: Maximum assistance Total Care Assistance: Maximum assistance   Functional Limitations Info  Speech Sight Info: Impaired Hearing Info: Impaired Speech Info: Impaired    SPECIAL CARE FACTORS FREQUENCY  PT (By licensed PT), OT (By licensed OT)     PT Frequency: 5x week OT Frequency: 5x week            Contractures Contractures Info: Not present    Additional Factors Info  Code Status, Allergies, Psychotropic Code Status Info: DNR Allergies Info: Prozac Psychotropic Info: Klonopin         Current Medications (01/02/2023):  This is the current hospital active medication list Current Facility-Administered Medications  Medication Dose Route Frequency Provider Last Rate Last Admin   acetaminophen (TYLENOL) tablet 325-650 mg  325-650 mg Oral Q6H PRN Gawne, Meghan M, PA-C       acetaminophen (TYLENOL) tablet 500 mg  500 mg Oral Q6H  Morene CrockerGomez-Caraballo, Maria, MD   500 mg at 01/02/23 1323   alum & mag hydroxide-simeth (MAALOX/MYLANTA) 200-200-20 MG/5ML suspension 30 mL  30 mL Oral Q4H PRN Gawne, Meghan M, PA-C       aspirin EC tablet 81 mg  81 mg Oral Daily Levester FreshGawne, Meghan M, PA-C   81 mg at 01/02/23 0847   atorvastatin (LIPITOR) tablet 20 mg  20 mg Oral QHS Gawne, Meghan M, PA-C   20 mg at 01/01/23 2100   bisacodyl (DULCOLAX) suppository 10 mg  10 mg Rectal Daily PRN Levester FreshGawne, Meghan M, PA-C       clonazePAM Scarlette Calico(KLONOPIN) tablet 0.5 mg  0.5 mg Oral QHS Gawne, Meghan M, PA-C   0.5 mg at 01/01/23 2101   docusate sodium (COLACE) capsule 100 mg  100 mg Oral BID Levester FreshGawne, Meghan M, PA-C   100 mg at 01/02/23 0847   enoxaparin (LOVENOX) injection 40 mg  40 mg Subcutaneous Q24H Levester FreshGawne, Meghan M, PA-C   40 mg at 01/01/23 2033   HYDROcodone-acetaminophen (NORCO) 7.5-325 MG per tablet 1-2 tablet  1-2 tablet Oral Q4H PRN Jenne PaneGawne, Meghan M, PA-C   2 tablet at 01/01/23 2048   HYDROcodone-acetaminophen (NORCO/VICODIN) 5-325 MG per tablet 1-2 tablet  1-2 tablet Oral Q4H PRN Jenne PaneGawne, Meghan M, PA-C   1 tablet at 01/02/23 0847   ibuprofen (ADVIL) tablet 200 mg  200 mg Oral Q8H Levester FreshGawne, Meghan M, PA-C   200 mg at 01/02/23 1323   menthol-cetylpyridinium (CEPACOL) lozenge 3 mg  1 lozenge Oral PRN Levester FreshGawne, Meghan M, PA-C       Or   phenol (CHLORASEPTIC) mouth spray 1 spray  1 spray Mouth/Throat PRN Gawne, Lindie SpruceMeghan M, PA-C       methocarbamol (ROBAXIN) tablet 500 mg  500 mg Oral Q6H PRN Gawne, Meghan M, PA-C       Or   methocarbamol (ROBAXIN) 500 mg in dextrose 5 % 50 mL IVPB  500 mg Intravenous Q6H PRN Gawne, Meghan M, PA-C       metoCLOPramide (REGLAN) tablet 5-10 mg  5-10 mg Oral Q8H PRN Gawne, Meghan M, PA-C       Or   metoCLOPramide (REGLAN) injection 5-10 mg  5-10 mg Intravenous Q8H PRN Gawne, Meghan M, PA-C       morphine (PF) 2 MG/ML injection 0.5-1 mg  0.5-1 mg Intravenous Q2H PRN Gawne, Meghan M, PA-C       mupirocin ointment (BACTROBAN) 2 % 1 Application  1  Application Nasal BID Jenne PaneGawne, Meghan M, PA-C   1 Application at 01/02/23 0848   ondansetron (ZOFRAN) tablet 4 mg  4 mg Oral Q6H PRN Gawne, Meghan M, PA-C       Or   ondansetron [facility](ZOFRAN) injection 4 mg  4 mg Intravenous Q6H PRN Gawne, Meghan M, PA-C       Oral care mouth rinse  15 mL Mouth Rinse PRN Gawne, Meghan M, PA-C       pantoprazole (PROTONIX) EC tablet 40 mg  40 mg Oral Daily Levester FreshGawne, Meghan M, PA-C   40 mg at 01/02/23 0847   polyethylene glycol (MIRALAX / GLYCOLAX) packet 17 g  17 g Oral Daily PRN Levester FreshGawne, Meghan M, PA-C       polyethylene glycol (MIRALAX /  GLYCOLAX) packet 17 g  17 g Oral Daily Evlyn Kanner, MD   17 g at 01/02/23 4097     Discharge Medications: Please see discharge summary for a list of discharge medications.  Relevant Imaging Results:  Relevant Lab Results:   Additional Information SS#: [id number]  Lorri Frederick, LCSW

## 2023-01-02 NOTE — Progress Notes (Signed)
Hospital day#3 Subjective:   Summary: Paul Klein is 67yo [name], hyperlipidemia, multiple falls, DNR/DNI,  admitted 4/5 with displaced proximal left femur fracture, now s/p hemiarthroplasty 01/01/2023 needed for pain management.  Overnight Events: None  Patient is not engaging in conversation today but endorses L hip pain at the incision site.   Objective:  Vital signs in last 24 hours: Vitals:   01/01/23 1233 01/01/23 1620 01/01/23 1946 01/02/23 0323  BP: 121/75 (!) 148/88 132/70 (!) 142/80  Pulse: 88  88 76  Resp: 20  18 18   Temp: 98 F (36.7 C)  97.9 F (36.6 C) 97.9 F (36.6 C)  TempSrc:   Oral Oral  SpO2: 98%  94% 96%  Weight:      Height:       Supplemental O2: Room Air SpO2: 96 % O2 Flow Rate (L/min): 0 L/min Filed Weights   12/30/22 1420 01/01/23 0710  Weight: 73 kg 73 kg    Physical Exam:  Constitutional:Chronically ill-appearing elderly man laying in bed, in no mild distress HENT: normocephalic atraumatic, moist mucous membranes Neck: supple Cardiovascular: regular rate and rhythm, no m/r/g Pulmonary/Chest: normal work of breathing on room air, lungs clear to auscultation bilaterally. No wheezing Abdominal: Normal BS, soft, non-tender, non-distended.  MSK: normal bulk and tone. Bandage over L surgical incision site dry with mild tenderness without surrounding erythema, edema or drainage. No pitting edema. Neurological: alert & oriented to self only. Not following commands or participating in much conversation otherwise.  Skin: warm and dry Psych: apathetic mood and affect    Intake/Output Summary (Last 24 hours) at 01/02/2023 0643 Last data filed at 01/02/2023 0400 Gross per 24 hour  Intake 1000 ml  Output 850 ml  Net 150 ml   Net IO Since Admission: 430 mL [01/02/23 0643]  Pertinent Labs:    Latest Ref Rng & Units 01/02/2023    2:57 AM 01/01/2023    3:41 AM 12/31/2022    2:22 AM  CBC  WBC 4.0 - 10.5 K/uL 11.9  11.9   15.0   Hemoglobin 13.0 - 17.0 g/dL 42.3  53.6  14.4   Hematocrit 39.0 - 52.0 % 38.7  44.1  42.9   Platelets 150 - 400 K/uL 410  397  439        Latest Ref Rng & Units 01/01/2023    3:41 AM 12/30/2022    2:59 PM 12/28/2022   10:48 AM  CMP  Glucose 70 - 99 mg/dL 315  400  867   BUN 8 - 23 mg/dL 23  21  16    Creatinine 0.61 - 1.24 mg/dL 6.19  5.09  3.26   Sodium 135 - 145 mmol/L 141  135  134   Potassium 3.5 - 5.1 mmol/L 3.9  4.3  3.7   Chloride 98 - 111 mmol/L 104  100  99   CO2 22 - 32 mmol/L 27  23  24    Calcium 8.9 - 10.3 mg/dL 9.3  9.0  8.7     Imaging: Pelvis Portable  Result Date: 01/01/2023 CLINICAL DATA:  [id number] History of left hip hemiarthroplasty [id number] EXAM: PORTABLE PELVIS 1-2 VIEWS COMPARISON:  April 5th 2024 FINDINGS: Status post LEFT hip arthroplasty. Orthopedic hardware is intact and without periprosthetic fracture or lucency. Small amount of soft tissue air consistent with interval surgical intervention. No additional fracture noted. IMPRESSION: Expected postsurgical changes status post LEFT hip arthroplasty. Electronically Signed   By: Meda Klinefelter M.D.  On: 01/01/2023 16:08    Assessment/Plan:   Principal Problem:   Femur fracture, left Active Problems:   Closed fracture of left hip   Dysphagia   Patient Summary: Paul Klein is 67yo [name], hyperlipidemia, multiple falls, DNR/DNI,  admitted 4/5 with displaced proximal left femur fracture, now s/p hemiarthroplasty 01/01/2023 needed for pain management, now POD1 and overall doing well  Displaced proximal left femur fracture s/p hemiarthroplasty  POD 1 Underwent hemiarthroplasty with Dr. Eulah Pont. Family goals of keeping him comfortable. Patient is endorsing pain today, RN notified as he has PRN medications ordered. Unable to follow commands during PT evaluation; given decontioning, patient needs continued PT supervision. Patient coming from memory unit; case management and social  work are now determining best discharge place whether it is at memory unit vs SNF in the acute post op setting. Will continue to manage post op pain at this time.  - Appreciate orthopedics assistance with this case - Tylenol 1000mg  every 6 hours - Ibuprofen 200mg  every 8 hours - Oxycodone 5mg  every 8 hours as needed - Bowel regimen: Miralax and Colace - DVT prophylaxis with lovenox - PT/OT after surgery  Alzheimer's dementia Oriented to self and not following conversations, which is patient's baseline per previous medical team sign out. Patient is not on continuous telemetry for comfort.   - Continue clonazepam 0.5mg  QHS - Delirium precautions in place  Protein-calorie malnutrition Patient continues to decline food per feeder/RN. SLP evaluated and considered patient with mild aspiration risk. Now on Dysphagia 1 diet and thin liquids. -Continue to monitor and follow RD and SLP recommendations  Acute Blood loss anemia Likely in setting of surgery. Will continue to monitor CBC while hospitalized.  HLD Continue Lipitor 20 mg daily  Diet:  Dysphagia 1 VTE: Enoxaparin Code: DNR PT/OT recs:  Needs PT as outpatient whether in memory unit vs SNF   Dispo: Anticipated discharge to  Needs PT as outpatient whether in memory unit vs SNF  in 1-2days pending bed availability.   Morene Crocker, MD Internal Medicine Resident PGY-1 Please contact the on call pager after 5 pm and on weekends at [phone].

## 2023-01-02 NOTE — Progress Notes (Signed)
RE:  Paul Klein       Date of Birth:  11/28/1955     Date:   01/02/23       To Whom It May Concern:  Please be advised that the above-named patient has a primary diagnosis of dementia which supersedes any psychiatric diagnosis.                 MD signature                Date

## 2023-01-03 ENCOUNTER — Encounter (HOSPITAL_COMMUNITY): Payer: Self-pay | Admitting: Orthopedic Surgery

## 2023-01-03 DIAGNOSIS — S7292XA Unspecified fracture of left femur, initial encounter for closed fracture: Secondary | ICD-10-CM | POA: Diagnosis not present

## 2023-01-03 DIAGNOSIS — G309 Alzheimer's disease, unspecified: Secondary | ICD-10-CM | POA: Diagnosis not present

## 2023-01-03 DIAGNOSIS — F0280 Dementia in other diseases classified elsewhere without behavioral disturbance: Secondary | ICD-10-CM | POA: Diagnosis not present

## 2023-01-03 DIAGNOSIS — R1310 Dysphagia, unspecified: Secondary | ICD-10-CM

## 2023-01-03 LAB — CBC
HCT: 36.9 % — ABNORMAL LOW (ref 39.0–52.0)
Hemoglobin: 11.9 g/dL — ABNORMAL LOW (ref 13.0–17.0)
MCH: 27.2 pg (ref 26.0–34.0)
MCHC: 32.2 g/dL (ref 30.0–36.0)
MCV: 84.2 fL (ref 80.0–100.0)
Platelets: 365 10*3/uL (ref 150–400)
RBC: 4.38 MIL/uL (ref 4.22–5.81)
RDW: 13.9 % (ref 11.5–15.5)
WBC: 11.4 10*3/uL — ABNORMAL HIGH (ref 4.0–10.5)
nRBC: 0 % (ref 0.0–0.2)

## 2023-01-03 MED ORDER — HYDROCODONE-ACETAMINOPHEN 5-325 MG PO TABS: 1.0000 | ORAL_TABLET | Freq: Four times a day (QID) | ORAL | 0 refills | Status: DC | PRN

## 2023-01-03 MED ORDER — ASPIRIN 81 MG PO TBEC: 81.0000 mg | DELAYED_RELEASE_TABLET | Freq: Two times a day (BID) | ORAL | 0 refills | Status: DC

## 2023-01-03 NOTE — Care Management Important Message (Signed)
Important Message  Patient Details  Name: Paul Klein MRN: [id number] Date of Birth: Oct 09, 1955   Medicare Important Message Given:  Yes     Sherilyn Banker 01/03/2023, 1:22 PM

## 2023-01-03 NOTE — TOC Progression Note (Addendum)
Transition of Care [facility]) - Progression Note    Patient Details  Name: Paul Klein MRN: [id number] Date of Birth: 1955/10/30  [facility] CM/SW Contact  Lorri Frederick, LCSW Phone Number: 01/03/2023, 10:10 AM  Clinical Narrative:   Bed offer provided to wife by phone, she would like to accept offer at [facility].  Waiting on confirmation from [facility].  1100: [facility] asking to see additional PT due to very limited participation with PT yesterday.  CSW requested if PT could see again today.   1445: PASSR received: [id number]  1615: [facility] has reviewed new PT note, unable to accept pt.    Expected Discharge Plan: Skilled Nursing Facility Barriers to Discharge: Continued Medical Work up, SNF Pending bed offer  Expected Discharge Plan and Services In-house Referral: Clinical Social Work   Post Acute Care Choice: Skilled Nursing Facility Living arrangements for the past 2 months: Assisted Living Facility ([name] memory care)                                       Social Determinants of Health (SDOH) Interventions SDOH Screenings   Depression (PHQ2-9): Low Risk  (06/02/2021)  Tobacco Use: Medium Risk (01/01/2023)    Readmission Risk Interventions     No data to display

## 2023-01-03 NOTE — Progress Notes (Signed)
Physical Therapy Treatment Patient Details Name: Paul Klein MRN: [id number] DOB: April 28, 1956 Today's Date: 01/03/2023   History of Present Illness Pt is a 67 y/o M s/p Hemiarthroplasty L hip - WBAT. PMH includes Alzheimer's dementia, multiple falls, and malnutrition.    PT Comments    Pt making very slow progress. Was able to get pt to EOB where he eventually was able to sit unsupported for a brief time. Tried to attempt using Stedy to stand but pt with heavy posterior lean when we began lifting his feet to place on Stedy. With pt's severe dementia will need to focus on repetition of getting pt into positions to progress mobility. As pain from hip surgery eases he may progress with mobility. Patient will benefit from continued post acute follow up therapy, <3 hours/day    Recommendations for follow up therapy are one component of a multi-disciplinary discharge planning process, led by the attending physician.  Recommendations may be updated based on patient status, additional functional criteria and insurance authorization.  Follow Up Recommendations  Can patient physically be transported by private vehicle: No    Assistance Recommended at Discharge Frequent or constant Supervision/Assistance  Patient can return home with the following Two people to help with walking and/or transfers;Two people to help with bathing/dressing/bathroom;Direct supervision/assist for financial management;Direct supervision/assist for medications management;Assist for transportation;Assistance with cooking/housework;Assistance with feeding;Help with stairs or ramp for entrance   Equipment Recommendations  Other (comment) (TBD at facility)    Recommendations for Other Services       Precautions / Restrictions Precautions Precautions: Fall Precaution Comments: posterior hip precautions Restrictions Weight Bearing Restrictions: No LLE Weight Bearing: Weight bearing as tolerated     Mobility  Bed  Mobility Overal bed mobility: Needs Assistance Bed Mobility: Supine to Sit, Sit to Supine     Supine to sit: +2 for physical assistance, Total assist, HOB elevated Sit to supine: +2 for physical assistance, Total assist   General bed mobility comments: Assist for all aspects. Pt rigid in extension initially coming to EOB. Able to eventually get flexion at the hips to bring trunk forward.    Transfers                        Ambulation/Gait                   Stairs             Wheelchair Mobility    Modified Rankin (Stroke Patients Only)       Balance Overall balance assessment: Needs assistance Sitting-balance support: Bilateral upper extremity supported, Feet supported Sitting balance-Leahy Scale: Zero Sitting balance - Comments: Initially pt required +2 max assist to sit EOB due to heavy posterior lean. Placed chair in front of pt and placed hands on back of chair to have pt bring trunk to neutral. Once there pt able to maintain for 3 minutes with min guard. Attempted to bring Stedy up to pt and with lifting pt's feet to place on Stedy pt with heavy posterior lean again.                                    Cognition Arousal/Alertness: Awake/alert Behavior During Therapy: Flat affect Overall Cognitive Status: History of cognitive impairments - at baseline  General Comments: not following commands or answering questions.Nonverbal until ~ halfway through session pt spoke. He did answer what his name was and said a few other things before non verbal for remainder of session.        Exercises      General Comments        Pertinent Vitals/Pain Pain Assessment Pain Assessment: Faces Faces Pain Scale: Hurts a little bit Pain Location: lt hip Pain Descriptors / Indicators: Grimacing Pain Intervention(s): Limited activity within patient's tolerance, Monitored during session    Home  Living                          Prior Function            PT Goals (current goals can now be found in the care plan section) Acute Rehab PT Goals Patient Stated Goal: unable to state Progress towards PT goals: Progressing toward goals (very slowly)    Frequency    Min 3X/week      PT Plan Current plan remains appropriate    Co-evaluation              AM-PAC PT "6 Clicks" Mobility   Outcome Measure  Help needed turning from your back to your side while in a flat bed without using bedrails?: Total Help needed moving from lying on your back to sitting on the side of a flat bed without using bedrails?: Total Help needed moving to and from a bed to a chair (including a wheelchair)?: Total Help needed standing up from a chair using your arms (e.g., wheelchair or bedside chair)?: Total Help needed to walk in hospital room?: Total Help needed climbing 3-5 steps with a railing? : Total 6 Click Score: 6    End of Session         PT Visit Diagnosis: Other abnormalities of gait and mobility (R26.89);Unsteadiness on feet (R26.81);Pain;Muscle weakness (generalized) (M62.81) Pain - Right/Left: Left Pain - part of body: Hip     Time: 1435-1450 PT Time Calculation (min) (ACUTE ONLY): 15 min  Charges:  $Therapeutic Activity: 8-22 mins                     [facility] PT Acute Rehabilitation Services Office [phone]    Angelina Ok [facility] 01/03/2023, 3:39 PM

## 2023-01-03 NOTE — Progress Notes (Signed)
Hospital day#4 Subjective:   Summary: Paul Klein is 67yo [name], hyperlipidemia, multiple falls, DNR/DNI,  admitted 4/5 with displaced proximal left femur fracture, now s/p hemiarthroplasty 01/01/2023, now POD 2   Overnight Events: None  Patient continues to have limited interaction or interest in food despite feeder's encouragement. Team spoke with wife who mentioned this is consistent with how patient behaves at home Objective:  Vital signs in last 24 hours: Vitals:   01/02/23 1938 01/03/23 0505 01/03/23 0731 01/03/23 1317  BP: (!) 164/78 129/79 136/78 125/68  Pulse: 79 76 82 70  Resp: 18 16 16 17   Temp: 97.9 F (36.6 C) 98 F (36.7 C) 97.7 F (36.5 C) 97.8 F (36.6 C)  TempSrc:  Oral Oral Oral  SpO2: 95% 97% 97% 95%  Weight:      Height:       Supplemental O2: Room Air SpO2: 95 % O2 Flow Rate (L/min): 0 L/min Filed Weights   12/30/22 1420 01/01/23 0710  Weight: 73 kg 73 kg    Physical Exam:  Constitutional:Chronically ill-appearing elderly man laying in bed, in no distress HENT: moist mucus membranes Neck: supple Cardiovascular: regular rate and rhythm, no m/r/g Pulmonary/Chest: normal work of breathing on room air, lungs clear to auscultation bilaterally. nor wheezing Abdominal: Normal BS, soft, non-tender, non-distended.  MSK: normal bulk and tone. Bandage over L surgical incision site dry without signs of tenderness of pain to palpation. No surrounding erythema, edema or drainage. No pitting edema. Neurological: Patient was not interactive today other than when calling name. Not following commands today Skin: warm and dr   Intake/Output Summary (Last 24 hours) at 01/03/2023 1427 Last data filed at 01/03/2023 1300 Gross per 24 hour  Intake 220 ml  Output 550 ml  Net -330 ml   Net IO Since Admission: 100 mL [01/03/23 1427]  Pertinent Labs:    Latest Ref Rng & Units 01/03/2023    3:18 AM 01/02/2023    2:57 AM 01/01/2023    3:41  AM  CBC  WBC 4.0 - 10.5 K/uL 11.4  11.9  11.9   Hemoglobin 13.0 - 17.0 g/dL 59.7  41.6  38.4   Hematocrit 39.0 - 52.0 % 36.9  38.7  44.1   Platelets 150 - 400 K/uL 365  410  397        Latest Ref Rng & Units 01/01/2023    3:41 AM 12/30/2022    2:59 PM 12/28/2022   10:48 AM  CMP  Glucose 70 - 99 mg/dL 536  468  032   BUN 8 - 23 mg/dL 23  21  16    Creatinine 0.61 - 1.24 mg/dL 1.22  4.82  5.00   Sodium 135 - 145 mmol/L 141  135  134   Potassium 3.5 - 5.1 mmol/L 3.9  4.3  3.7   Chloride 98 - 111 mmol/L 104  100  99   CO2 22 - 32 mmol/L 27  23  24    Calcium 8.9 - 10.3 mg/dL 9.3  9.0  8.7     Imaging: No results found.  Assessment/Plan:   Principal Problem:   Femur fracture, left Active Problems:   Alzheimer's dementia without behavioral disturbance, psychotic disturbance, mood disturbance, or anxiety   Closed fracture of left hip   Dysphagia   Patient Summary: Paul Klein is 67yo [name], hyperlipidemia, multiple falls, DNR/DNI,  admitted 4/5 with displaced proximal left femur fracture, now s/p hemiarthroplasty 01/01/2023,  medically stable for discharge, awaiting SNF placement for rehab  Displaced proximal left femur fracture s/p hemiarthroplasty  POD 1 Underwent hemiarthroplasty with Dr. Eulah Pont. Family goals of keeping him comfortable. No major changes today. Continue post op pain management and awaiting SNF placement.  - Appreciate orthopedics assistance with this case - Tylenol 1000mg  every 6 hours - Ibuprofen 200mg  every 8 hours - Oxycodone 5mg  every 8 hours as needed - Bowel regimen: Miralax and Colace - DVT prophylaxis with lovenox; orthopedics recommends outpatient 81 mg ASA BID for 30 for DVT prophylaxys  - PT/OT after surgery  Alzheimer's dementia Patient is at baseline.  - Continue clonazepam 0.5mg  QHS - Delirium precautions in place  Protein-calorie malnutrition Patient continues to decline food per feeder/RN. Spoke with wife  today who reports this has been patient's trend for a few months now. Concerned this is likely a sign of dementia's progression.  -Continue Dysphagia 1 diet; continue encouraging feeds -Continue to monitor and follow RD and SLP recommendations  Microcytic anemia Stable -Will continue to monitor CBC while hospitalized.  HLD Continue Lipitor 20 mg daily  Diet:  Dysphagia 1 VTE: Enoxaparin Code: DNR PT/OT recs:  Needs PT as outpatient whether in memory unit vs SNF   Dispo: Anticipated discharge to  SNF in 1-2days pending bed availability.   Morene Crocker, MD Internal Medicine Resident PGY-1 Please contact the on call pager after 5 pm and on weekends at [phone].

## 2023-01-03 NOTE — Progress Notes (Signed)
    Subjective: Patient unable to provide much to exam. When I asked if in pain he said yes.   Objective:   VITALS:   Vitals:   01/02/23 1444 01/02/23 1938 01/03/23 0505 01/03/23 0731  BP: 127/69 (!) 164/78 129/79 136/78  Pulse: 83 79 76 82  Resp: 15 18 16 16   Temp: 98.2 F (36.8 C) 97.9 F (36.6 C) 98 F (36.7 C) 97.7 F (36.5 C)  TempSrc: Oral  Oral Oral  SpO2: 99% 95% 97% 97%  Weight:      Height:          Latest Ref Rng & Units 01/03/2023    3:18 AM 01/02/2023    2:57 AM 01/01/2023    3:41 AM  CBC  WBC 4.0 - 10.5 K/uL 11.4  11.9  11.9   Hemoglobin 13.0 - 17.0 g/dL 88.5  02.7  74.1   Hematocrit 39.0 - 52.0 % 36.9  38.7  44.1   Platelets 150 - 400 K/uL 365  410  397       Latest Ref Rng & Units 01/01/2023    3:41 AM 12/30/2022    2:59 PM 12/28/2022   10:48 AM  BMP  Glucose 70 - 99 mg/dL 287  867  672   BUN 8 - 23 mg/dL 23  21  16    Creatinine 0.61 - 1.24 mg/dL 0.94  7.09  6.28   Sodium 135 - 145 mmol/L 141  135  134   Potassium 3.5 - 5.1 mmol/L 3.9  4.3  3.7   Chloride 98 - 111 mmol/L 104  100  99   CO2 22 - 32 mmol/L 27  23  24    Calcium 8.9 - 10.3 mg/dL 9.3  9.0  8.7    Intake/Output      04/08 0701 04/09 0700 04/09 0701 04/10 0700   P.O. 220    I.V. (mL/kg)     IV Piggyback     Total Intake(mL/kg) 220 (3)    Urine (mL/kg/hr) 550 (0.3)    Blood     Total Output 550    Net -330            Physical Exam: General: NAD.  Laying in bed, calm Resp: No increased wob Cardio: regular rate and rhythm ABD soft Neurologically intact MSK Neurovascularly intact Sensation intact distally Intact pulses distally Dorsiflexion/Plantar flexion intact Incision: dressing C/D/I   Assessment: 2 Days Post-Op  S/P Procedure(s) (LRB): ARTHROPLASTY BIPOLAR HIP (HEMIARTHROPLASTY) (Left) by Dr. Jewel Baize. Eulah Pont on 01/01/23  Principal Problem:   Femur fracture, left Active Problems:   Alzheimer's dementia without behavioral disturbance, psychotic disturbance, mood  disturbance, or anxiety   Closed fracture of left hip   Dysphagia   Plan: Work with PT/OT as able  Incentive Spirometry Elevate and Apply ice  Weightbearing: WBAT LLE Insicional and dressing care: Dressings left intact until follow-up and Reinforce dressings as needed Orthopedic device(s): None Showering: Keep dressing dry VTE prophylaxis: Lovenox 40mg  qd , SCDs, ambulation Pain control: minimize narcotics as able d/t dementia Follow - up plan: 2 weeks Contact information:  Margarita Rana MD, Levester Fresh PA-C  Dispo:  SNF pending bed placement. Ok to d/c from ortho standpoint. Will print and sign pain and ASA scripts.      Jenne Pane, PA-C Office [phone] 01/03/2023, 8:01 AM

## 2023-01-04 DIAGNOSIS — G309 Alzheimer's disease, unspecified: Secondary | ICD-10-CM | POA: Diagnosis not present

## 2023-01-04 DIAGNOSIS — F0280 Dementia in other diseases classified elsewhere without behavioral disturbance: Secondary | ICD-10-CM | POA: Diagnosis not present

## 2023-01-04 DIAGNOSIS — S7292XA Unspecified fracture of left femur, initial encounter for closed fracture: Secondary | ICD-10-CM | POA: Diagnosis not present

## 2023-01-04 LAB — CBC
HCT: 39.9 % (ref 39.0–52.0)
Hemoglobin: 12.8 g/dL — ABNORMAL LOW (ref 13.0–17.0)
MCH: 27.1 pg (ref 26.0–34.0)
MCHC: 32.1 g/dL (ref 30.0–36.0)
MCV: 84.4 fL (ref 80.0–100.0)
Platelets: 388 10*3/uL (ref 150–400)
RBC: 4.73 MIL/uL (ref 4.22–5.81)
RDW: 14 % (ref 11.5–15.5)
WBC: 10.7 10*3/uL — ABNORMAL HIGH (ref 4.0–10.5)
nRBC: 0 % (ref 0.0–0.2)

## 2023-01-04 MED ORDER — ACETAMINOPHEN 325 MG PO TABS
650.0000 mg | ORAL_TABLET | Freq: Four times a day (QID) | ORAL | Status: DC
  Administered 2023-01-04 – 2023-01-06 (×7): 650 mg via ORAL
  Filled 2023-01-04 (×7): qty 2

## 2023-01-04 MED ORDER — ACETAMINOPHEN 325 MG PO TABS: 325.0000 mg | ORAL_TABLET | Freq: Four times a day (QID) | ORAL | Status: DC

## 2023-01-04 NOTE — Progress Notes (Signed)
Speech Language Pathology Treatment: Dysphagia  Patient Details Name: Paul Klein MRN: [id number] DOB: 1955-11-20 Today's Date: 01/04/2023 Time: 6222-9798 SLP Time Calculation (min) (ACUTE ONLY): 11 min  Assessment / Plan / Recommendation Clinical Impression  Pt initially awake but began to fall asleep towards end of session. Repositioned upright and observed with puree and straw sips thin. Demonstrated intermittent difficulty coordinating use of straw and therapist used end of straw to place small amount in oral cavity and after that he was able to extract water via straw. No s/s aspiration present. He accepted bites applesauce with minimal delayed swallow onset noted occasionally.  Did not attempt higher textures due to lethargy and waxing and waning cognition. Given his dementia he is at higher risk of dysphagia and decreased nutritional status and will likely progress where he may orally hold his food or show signs of aspiration etc. No family at bedside. Recommend continued ST at SNF and onsite SLP can provide education re: dementia and dysphagia to family.   HPI HPI: Paul Klein is 67 yo [name] and hyperlipidemia admitted 4/5 with displaced proximal left femur fracture, now s/p hemiarthroplasty with orthopedics.      SLP Plan  Continue with current plan of care      Recommendations for follow up therapy are one component of a multi-disciplinary discharge planning process, led by the attending physician.  Recommendations may be updated based on patient status, additional functional criteria and insurance authorization.    Recommendations  Diet recommendations: Thin liquid;Dysphagia 1 (puree) Liquids provided via: Straw Medication Administration: Crushed with puree Supervision: Full supervision/cueing for compensatory strategies;Staff to assist with self feeding Compensations: Slow rate;Minimize environmental distractions;Small sips/bites;Lingual sweep  for clearance of pocketing Postural Changes and/or Swallow Maneuvers: Seated upright 90 degrees                  Oral care BID   Frequent or constant Supervision/Assistance Dysphagia, oral phase (R13.11)     Continue with current plan of care     [name]  01/04/2023, 11:58 AM

## 2023-01-04 NOTE — Discharge Summary (Addendum)
Name: Paul Klein MRN: [id number] DOB: 10/18/1955 67 y.o. PCP: System, Provider Not In  Date of Admission: 12/30/2022  2:04 PM Date of Discharge: 01/06/2023 9:56 AM Attending Physician: Dr. Sol Blazing  Discharge Diagnosis: Principal Problem:   Femur fracture, left Active Problems:   Alzheimer's dementia without behavioral disturbance, psychotic disturbance, mood disturbance, or anxiety   Closed fracture of left hip   Dysphagia    Discharge Medications: Allergies as of 01/06/2023       Reactions   Prozac [fluoxetine Hcl]    SSRIs in general cause worsened depression and suicidal ideation        Medication List     STOP taking these medications    diclofenac Sodium 1 % Gel Commonly known as: VOLTAREN   divalproex 500 MG 24 hr tablet Commonly known as: DEPAKOTE ER   doxazosin 2 MG tablet Commonly known as: CARDURA   Eszopiclone 3 MG Tabs   furosemide 20 MG tablet Commonly known as: LASIX   gabapentin 400 MG capsule Commonly known as: NEURONTIN   haloperidol 0.5 MG tablet Commonly known as: HALDOL   hydrOXYzine 50 MG capsule Commonly known as: VISTARIL   memantine 5 MG tablet Commonly known as: NAMENDA   nitrofurantoin (macrocrystal-monohydrate) 100 MG capsule Commonly known as: MACROBID   QUEtiapine 25 MG tablet Commonly known as: SEROQUEL       TAKE these medications    acetaminophen 325 MG tablet Commonly known as: TYLENOL Take 2 tablets (650 mg total) by mouth every 6 (six) hours as needed for moderate pain.   aspirin EC 81 MG tablet Take 1 tablet (81 mg total) by mouth 2 (two) times daily. To prevent blood clots for 30 days after surgery.   atorvastatin 20 MG tablet Commonly known as: LIPITOR Take 20 mg by mouth at bedtime.   clonazePAM 0.5 MG tablet Commonly known as: KlonoPIN Take 1 tablet (0.5 mg total) by mouth at bedtime. What changed: when to take this        Disposition and follow-up:   [name] was discharged from  [facility] in Fair condition.  At the hospital follow up visit please address:  1.  Follow-up:  *Displaced proximal left femur fracture s/p hemiarthroplasty  -Ensure pain management and comfort -Ensure completion of ASA 81 mg BID for 30 days until 5/7 -Patient should remain in his usual ASA 81 mg daily dose after 01/31/2023 -Patient is to complete <3HR/day PT  *Alzheimer's dementia *Protein-calorie malnutrition -Ensure feeding supervision and encouragement -Patient with multiple historic medications; however, only to be on clonazepam nightly -Patient with condom catheter while inpatient to allow to dry surgical wounds. This can be switched to briefs once at the facility  *Normocytic anemia -Follow CBC for signs of blood loss  2.  Labs / imaging needed at time of follow-up: CBC  3.  Pending labs/ test needing follow-up: none  4.  Medication Changes  At the time of admission, patient was taking the following, which he can resume at discharge Acetaminophen 650 mg every 6 hours Atorvastatin 20 mg nightly Clonazepam 0.5 mg nightly Diclofenac gel Fish oil multivitamin   Previously taking ASA 81 mg daily. He should take 81 mg BID for 30 days and continue taking ASA daily thereafter  Follow-up Appointments:  Contact information for follow-up providers     Sheral Apley, MD. Schedule an appointment as soon as possible for a visit in 2 week(s).   Specialty: Orthopedic Surgery Why: for post  op checkup Contact information: [address] [place] [address] [phone]              Contact information for after-discharge care     Destination     [facility] Preferred SNF .   Service: Skilled Nursing Contact information: [address] [place] [address] [phone]                     Hospital Course by problem list:  Displaced proximal left femur fracture   s/p hemiarthroplasty  Patient presented from facility with externally rotated and shortened LLE. Displaced transcervical proximal L femur fracture seen on XR. Orthopedics was consulted; patient underwent hemiarthroplasty on 4/7 as fracture compromised blood supply to the head of femur. Given patient's family GOC discussion,  patient underwent surgery as it aligned with the goal of keeping patient comfortable. Patient's recovery has been complicated by Alzheimer's disease and intermittent ability to participate in physical therapy. Otherwise, patient's pain has been managed with scheduled tylenol and PRN Norco for breakthrough pain. Per orthopedics, patient is to complete 30 day-course of 81 mg ASA BID for DVT prophylaxis.   Alzheimer's dementia  Protein-calorie malnutrition Patient with advanced dementia at admission, with concerns of end-stage dementia given limited speech and interaction, feeding support and food interest, and minimal physical activity, which is at baseline per family. Patient's family wishes to have patient return to memory unit. Patient is to continue with nightly clonazepam at discharge.  Normocytic anemia 2/2 blood loss during surgery. Has remained stable following surgery.  L-sided pleural effusion This was noted during admission and was stable from recent admission. Patient physical exam and oxygen saturation has remained stable during this hospitalization.   HLD Continue taking atorvastatin 20 mg daily. Though given the advanced dementia, would consider discontinuing this medication.   Discharge Exam:   Vitals:   01/06/23 0300 01/06/23 0901  BP: 120/78 123/79  Pulse: 85 86  Resp: 19 16  Temp: 99.1 F (37.3 C) 98 F (36.7 C)  SpO2: 95%     HENT: normocephalic atraumatic, mucous membranes moist Cardiovascular: regular rate and rhythm, no m/r/g Pulmonary/Chest: normal work of breathing on room air, lungs clear to auscultation bilaterally. No crackles   Abdominal: soft, non-tender, non-distended.  Neurological: alert; answering questions intermittently, following commands intermittently MSK: no gross abnormalities. No pitting edema Skin: warm and dry Psych: Alert and only oriented to self; more interactive this morning and following more commands this morning compared to yesterday.  Pertinent Labs, Studies, and Procedures:     Latest Ref Rng & Units 01/04/2023    2:00 AM 01/03/2023    3:18 AM 01/02/2023    2:57 AM  CBC  WBC 4.0 - 10.5 K/uL 10.7  11.4  11.9   Hemoglobin 13.0 - 17.0 g/dL 69.6  29.5  28.4   Hematocrit 39.0 - 52.0 % 39.9  36.9  38.7   Platelets 150 - 400 K/uL 388  365  410        Latest Ref Rng & Units 01/01/2023    3:41 AM 12/30/2022    2:59 PM 12/28/2022   10:48 AM  CMP  Glucose 70 - 99 mg/dL 132  440  102   BUN 8 - 23 mg/dL 23  21  16    Creatinine 0.61 - 1.24 mg/dL 7.25  3.66  4.40   Sodium 135 - 145 mmol/L 141  135  134   Potassium 3.5 -  5.1 mmol/L 3.9  4.3  3.7   Chloride 98 - 111 mmol/L 104  100  99   CO2 22 - 32 mmol/L 27  23  24    Calcium 8.9 - 10.3 mg/dL 9.3  9.0  8.7     DG Chest 1 View  Result Date: 12/30/2022 CLINICAL DATA:  Trauma, fall EXAM: CHEST  1 VIEW COMPARISON:  Previous studies including the examination of 12/25/2022 FINDINGS: Transverse diameter of heart is in upper limits of normal. There is haziness in left hemithorax, possibly layering of small to moderate sized left pleural effusion. There is improvement in aeration in left lower lung field. There is no focal pulmonary consolidation. Right lateral CP angle is clear. There is no pneumothorax. IMPRESSION: There is mild diffuse haziness in the left hemithorax, possibly due to layering of small to moderate sized left pleural effusion in this supine radiograph. There are no signs of alveolar pulmonary edema or new focal pulmonary consolidation. Electronically Signed   By: Ernie AvenaPalani  Rathinasamy M.D.   On: 12/30/2022 15:15   DG Hip Unilat With Pelvis 2-3  Views Left  Result Date: 12/30/2022 CLINICAL DATA:  Fall EXAM: DG HIP (WITH OR WITHOUT PELVIS) 3V LEFT COMPARISON:  None Available. FINDINGS: Displaced transcervical proximal left femur fracture. Mild degenerative changes of the bilateral hips soft tissues are unremarkable. IMPRESSION: Displaced transcervical proximal left femur fracture. Electronically Signed   By: Allegra LaiLeah  Strickland M.D.   On: 12/30/2022 15:14    Signed: Morene CrockerMaria Gomez Caraballo, MD [facility] - PGY1 Pager: [phone] 01/06/2023, 9:56 AM

## 2023-01-04 NOTE — Progress Notes (Signed)
   HD#5 SUBJECTIVE:  Patient Summary: Paul Klein is 67yo [name], hyperlipidemia, multiple falls, DNR/DNI,  admitted 4/5 with displaced proximal left femur fracture, now s/p hemiarthroplasty 01/01/2023   Overnight Events: no acute events overnight  Interim History: The patient is more communicative/interactive today, however, it is still limited. He does not endorse any pain, but states that he is hungry.   OBJECTIVE:  Vital Signs: Vitals:   01/03/23 1317 01/03/23 2302 01/04/23 0847 01/04/23 1445  BP: 125/68 (!) 148/77 119/75 113/72  Pulse: 70 82 80 85  Resp: 17 18 16 17   Temp: 97.8 F (36.6 C) 98.5 F (36.9 C) (!) 97.4 F (36.3 C) 97.6 F (36.4 C)  TempSrc: Oral Axillary  Oral  SpO2: 95% 98% 97% 96%  Weight:      Height:       Supplemental O2: Room Air SpO2: 96 % O2 Flow Rate (L/min): 0 L/min  Filed Weights   12/30/22 1420 01/01/23 0710  Weight: 73 kg 73 kg     Intake/Output Summary (Last 24 hours) at 01/04/2023 1450 Last data filed at 01/03/2023 1954 Gross per 24 hour  Intake 0 ml  Output 100 ml  Net -100 ml   Net IO Since Admission: 0 mL [01/04/23 1450]  Physical Exam: General: Chronically ill-appearing male laying in bed. No acute distress. CV: RRR. No murmurs. No LE edema Pulmonary: Lungs CTAB. Normal effort.s. Extremities: Bandage over L surgical site incision, no tenderness to palpation. Skin: Warm and dry.  Neuro: Minimally interactive, communicates in 1-3 word phrases   ASSESSMENT/PLAN:  Assessment: Principal Problem:   Femur fracture, left Active Problems:   Alzheimer's dementia without behavioral disturbance, psychotic disturbance, mood disturbance, or anxiety   Closed fracture of left hip   Dysphagia   Plan: #Displaced proximal left femur fracture s/p hemiarthroplasty on 4/7 Underwent hemiarthroplasty with Dr. Eulah Pont for goal of keeping the patient comfortable. No major changes today and the patient remains  medically stable for discharge to SNF. Placement/insurance [facility] still pending. -Tylenol 650 mg q6h -Norco 5-325 mg 1-2 tablets q4h PRN -Ibuprofen 200 mg q8h -Bowel regimen: Miralax and Colace -ASA 81 mg bid x 30 days for DVT ppx -Continue to work with PT/OT as able  #Alzheimer's dementia Patient is at baseline. Minimally interactive. -Continue clonazepam 0.5mg  QHS -Delirium precautions in place   Protein-calorie malnutrition Patient has not been eating much, however, he does endorse that he is hungry today.  -Continue Dysphagia 1 diet; continue encouraging feeds + assist patient with feeder -Continue to monitor and follow RD and SLP recommendations   Microcytic anemia Stable, Hb 12.8 today. No need for daily CBCs anymore.   HLD -Continue Lipitor 20 mg daily  Best Practice: Diet: Dysphagia 1 IVF: Fluids: none VTE: SCDs Start: 01/01/23 1240 enoxaparin (LOVENOX) injection 40 mg Start: 12/30/22 2200 Code: DNR AB: none Therapy Recs: SNF Family Contact: wife, to be notified. Last notified 4/9 DISPO: Anticipated discharge  1-2  to Skilled nursing facility pending  placement and insurance authorization .  Signature: Elza Rafter, D.O.  Internal Medicine Resident, PGY-2 [facility]  Pager: [phone] 2:50 PM, 01/04/2023   Please contact the on call pager after 5 pm and on weekends at [phone].

## 2023-01-04 NOTE — TOC Progression Note (Addendum)
Transition of Care [facility]) - Progression Note    Patient Details  Name: Paul Klein MRN: [id number] Date of Birth: [birth year]  Transition of [facility]) CM/SW Contact  Lorri Frederick, LCSW Phone Number: 01/04/2023, 10:42 AM  Clinical Narrative:   0830: CSW reached out to [place], [place], [place], [place] who have not responded to referral.     [facility], [place], [place] all cannot offer beds.  1040: CSW informed pt wife [name] that [facility] cannot offer bed, updated her on current bed offers.  She would like to see if any additional bed offers are made, is willing to go to [place] if that is option.  Will talk with daughter about current offers.  CSW reached out to [facility] to review referral.   1100: [facility] offer bed.  Wife updated.  Auth request submitted in [place] with facility pending.  1144: [name], pt daughter.  They want to accept offer at [facility].  [facility] notified.  Updated facility in [place].   Expected Discharge Plan: Skilled Nursing Facility Barriers to Discharge: Continued Medical Work up, SNF Pending bed offer  Expected Discharge Plan and Services In-house Referral: Clinical Social Work   Post Acute Care Choice: Skilled Nursing Facility Living arrangements for the past 2 months: Assisted Living Facility ([name] memory care)                                       Social Determinants of Health (SDOH) Interventions SDOH Screenings   Depression (PHQ2-9): Low Risk  (06/02/2021)  Tobacco Use: Medium Risk (01/03/2023)    Readmission Risk Interventions     No data to display

## 2023-01-05 DIAGNOSIS — S7292XA Unspecified fracture of left femur, initial encounter for closed fracture: Secondary | ICD-10-CM | POA: Diagnosis not present

## 2023-01-05 DIAGNOSIS — F0280 Dementia in other diseases classified elsewhere without behavioral disturbance: Secondary | ICD-10-CM | POA: Diagnosis not present

## 2023-01-05 DIAGNOSIS — G309 Alzheimer's disease, unspecified: Secondary | ICD-10-CM | POA: Diagnosis not present

## 2023-01-05 NOTE — Progress Notes (Signed)
   HD#6 SUBJECTIVE:  Patient Summary: Paul Klein is 67yo [name], hyperlipidemia, multiple falls, DNR/DNI,  admitted 4/5 with displaced proximal left femur fracture, now s/p hemiarthroplasty 01/01/2023   Overnight Events: no acute events  Interim History: Patient conversant today, following commands, though continues to be limited in interaction. Denies pain at this time. Endorses hunger and thirst. RN known.   OBJECTIVE:  Vital Signs: Vitals:   01/04/23 1445 01/04/23 2259 01/05/23 0425 01/05/23 0911  BP: 113/72 120/81 125/82 124/71  Pulse: 85 86 78 82  Resp: 17 18 14 18   Temp: 97.6 F (36.4 C) (!) 97.4 F (36.3 C) 97.7 F (36.5 C) 98.3 F (36.8 C)  TempSrc: Oral   Oral  SpO2: 96% 98% 97% 97%  Weight:      Height:       Supplemental O2: Room Air SpO2: 97 % O2 Flow Rate (L/min): 0 L/min  Filed Weights   12/30/22 1420 01/01/23 0710  Weight: 73 kg 73 kg     Intake/Output Summary (Last 24 hours) at 01/05/2023 1413 Last data filed at 01/05/2023 1200 Gross per 24 hour  Intake 800 ml  Output 577 ml  Net 223 ml   Net IO Since Admission: 463 mL [01/05/23 1413]  Physical Exam: General: Chronically ill-appearing male laying in bed in no acute distress CV: RRR. No murmurs. No LE edema Pulmonary: Lungs CTAB. Normal effort Extremities: Bandage over L surgical site incision, no tenderness to palpation, erythema or drainage Neuro: Alert, increased participating in conversation though still limited and slow to respond   ASSESSMENT/PLAN:  Assessment: Principal Problem:   Femur fracture, left Active Problems:   Alzheimer's dementia without behavioral disturbance, psychotic disturbance, mood disturbance, or anxiety   Closed fracture of left hip   Dysphagia   Plan: #Displaced proximal left femur fracture s/p hemiarthroplasty on 4/7 Underwent hemiarthroplasty with Dr. Eulah Pont for goal of keeping the patient comfortable. No major changes today.  Patient remains medically stable for discharge, After being denied admission to SNF on the basis of limited engagement during PT sessions, patient's case reviewed by memory care unit. Patient has been cleared for dischage to memory unit on 4/12 -Tylenol 650 mg q6h -Norco 5-325 mg 1-2 tablets q4h PRN -Ibuprofen 200 mg q8h -Bowel regimen: Miralax and Colace -ASA 81 mg bid x 30 days for DVT ppx -Continue to work with PT/OT as able prior to discharge  #Alzheimer's dementia More interactive today, though still limited -Continue clonazepam 0.5mg  QHS -Delirium precautions in place   Protein-calorie malnutrition Mildly increased food intake with feeder and persistent encouragement -Continue Dysphagia 1 diet -Continue to monitor and follow RD and SLP recommendations   Microcytic anemia Last CBC stable with Hgb at  12.8 4/10. No need for daily CBCs anymore.   HLD -Continue Lipitor 20 mg daily  Best Practice: Diet: Dysphagia 1 IVF: Fluids: none VTE: SCDs Start: 01/01/23 1240 enoxaparin (LOVENOX) injection 40 mg Start: 12/30/22 2200 Code: DNR AB: none Therapy Recs: Memory unit Family Contact: wife, to be notified. Last notified 4/9 DISPO: Anticipated discharge to memory unit on 4/12  Morene Crocker, MD [facility] Internal Medicine Program - PGY-1 01/05/2023, 3:07 PM  Please contact the on call pager after 5 pm and on weekends at [phone].

## 2023-01-05 NOTE — TOC Progression Note (Addendum)
Transition of Care [facility]) - Progression Note    Patient Details  Name: Paul Klein MRN: [id number] Date of Birth: 1956-08-08  [facility]) CM/SW Contact  Lorri Frederick, LCSW Phone Number: 01/05/2023, 1:08 PM  Clinical Narrative:   [name] remains pending for SNF.  1330: TC [facility].  Peer to peer offered: [phone], option 5 by 9am tomorrow, Friday, 4/12.  MD notified.  1430: TC MD, peer to peer denied.    CSW informed daughter [name] of insurance denial.  She went by [name] today and the indicated pt could return.  CSW spoke with [name] who confirmed they can accept pt back tomorrow.  She will not be working, can speak to [name] or [name], will need [place]2.  Fax DC summary to [phone].   Expected Discharge Plan: Skilled Nursing Facility Barriers to Discharge: Continued Medical Work up, SNF Pending bed offer  Expected Discharge Plan and Services In-house Referral: Clinical Social Work   Post Acute Care Choice: Skilled Nursing Facility Living arrangements for the past 2 months: Assisted Living Facility ([name] memory care)                                       Social Determinants of Health (SDOH) Interventions SDOH Screenings   Depression (PHQ2-9): Low Risk  (06/02/2021)  Tobacco Use: Medium Risk (01/03/2023)    Readmission Risk Interventions     No data to display

## 2023-01-05 NOTE — [place] FL2 (Addendum)
[place] MEDICAID FL2 LEVEL OF CARE FORM     IDENTIFICATION  Patient Name: Paul Klein Birthdate: Apr 06, 1956 Sex: male Admission Date (Current Location): 12/30/2022  [place] and [place] Number:  Haynes Bast [id number] O Facility and Address:  [facility], [address] [place], [place] [address]      Provider Number: [id number]  Attending Physician Name and Address:  Dickie La, MD  Relative Name and Phone Number:  Enns,Dianne Spouse  [phone]    Current Level of Care: Hospital Recommended Level of Care: Memory Care [facility]) Prior Approval Number:    Date Approved/Denied:   PASRR Number:    Discharge Plan: SNF    Current Diagnoses: Patient Active Problem List   Diagnosis Date Noted   Dysphagia 01/01/2023   Femur fracture, left 12/31/2022   Closed fracture of left hip 12/31/2022   S/p left hip fracture 12/30/2022   Alzheimer's dementia without behavioral disturbance, psychotic disturbance, mood disturbance, or anxiety 12/26/2022   Encephalopathy 12/25/2022   Weakness 12/25/2022   UTI (urinary tract infection) 12/25/2022   Dementia with behavioral disturbance 07/09/2021   External hemorrhoid 06/02/2021   Hallucination 06/02/2021   Anxiety 06/02/2021   Rectal discomfort 06/02/2021   Advance directive discussed with patient 01/25/2021   Need for pneumococcal vaccination 01/25/2021   Hyperlipidemia 11/30/2020   Encounter for health maintenance examination in adult 11/30/2020   Medicare annual wellness visit, subsequent 11/30/2020   Alzheimer disease 11/30/2020   Abnormal albumin 07/07/2020   Impaired fasting blood sugar 07/07/2020   Atherosclerosis of coronary artery of native heart without angina pectoris 07/07/2020   Aortic atherosclerosis 07/07/2020   Essential hypertension, benign 04/29/2020   Urinary frequency 04/29/2020   Overactive bladder 04/29/2020   Benign prostatic hyperplasia with urinary frequency  04/29/2020   Screen for colon cancer 04/29/2020   Dementia without behavioral disturbance 04/15/2020   Abdominal discomfort 04/15/2020   Frequent urination 04/15/2020   Constipation 10/15/2019   Bradycardia 07/18/2018   Onychomycosis 08/30/2016   Insomnia 08/03/2015   Generalized anxiety disorder 08/03/2015   Screening for prostate cancer 08/03/2015   Need for influenza vaccination 08/03/2015   Vaccine counseling 08/03/2015   Second hand tobacco smoke exposure 07/31/2014   Dyslipidemia 07/31/2014    Orientation RESPIRATION BLADDER Height & Weight     Self  Normal Incontinent Weight: 160 lb 15 oz (73 kg) Height:  6' 0.01" (182.9 cm)  BEHAVIORAL SYMPTOMS/MOOD NEUROLOGICAL BOWEL NUTRITION STATUS      Incontinent Diet (Dys 1)  AMBULATORY STATUS COMMUNICATION OF NEEDS Skin   Total Care Verbally Surgical wounds                       Personal Care Assistance Level of Assistance  Total care Bathing Assistance: Maximum assistance Feeding assistance: Maximum assistance Dressing Assistance: Maximum assistance Total Care Assistance: Maximum assistance   Functional Limitations Info  Speech Sight Info: Impaired Hearing Info: Impaired Speech Info: Impaired    SPECIAL CARE FACTORS FREQUENCY  PT (By licensed PT), OT (By licensed OT)     PT Frequency: evaluate and treat OT Frequency: evaluate and treat            Contractures Contractures Info: Not present    Additional Factors Info  Code Status, Allergies, Psychotropic Code Status Info: DNR Allergies Info: Prozac Psychotropic Info: Klonopin         Current Medications (01/05/2023):  This is the current hospital active medication list Current Facility-Administered Medications  Medication Dose Route Frequency Provider Last Rate Last Admin   acetaminophen (TYLENOL) tablet 650 mg  650 mg Oral Q6H Morene CrockerGomez-Caraballo, Maria, MD   650 mg at 01/05/23 1348   alum & mag hydroxide-simeth (MAALOX/MYLANTA) 200-200-20 MG/5ML  suspension 30 mL  30 mL Oral Q4H PRN Gawne, Meghan M, PA-C       aspirin EC tablet 81 mg  81 mg Oral Daily Levester FreshGawne, Meghan M, PA-C   81 mg at 01/05/23 0820   atorvastatin (LIPITOR) tablet 20 mg  20 mg Oral QHS Gawne, Meghan M, PA-C   20 mg at 01/04/23 2206   bisacodyl (DULCOLAX) suppository 10 mg  10 mg Rectal Daily PRN Levester FreshGawne, Meghan M, PA-C       clonazePAM Scarlette Calico(KLONOPIN) tablet 0.5 mg  0.5 mg Oral QHS Gawne, Meghan M, PA-C   0.5 mg at 01/04/23 2206   docusate sodium (COLACE) capsule 100 mg  100 mg Oral BID Levester FreshGawne, Meghan M, PA-C   100 mg at 01/04/23 1013   enoxaparin (LOVENOX) injection 40 mg  40 mg Subcutaneous Q24H Levester FreshGawne, Meghan M, PA-C   40 mg at 01/04/23 2206   HYDROcodone-acetaminophen (NORCO/VICODIN) 5-325 MG per tablet 1-2 tablet  1-2 tablet Oral Q4H PRN Jenne PaneGawne, Meghan M, PA-C   1 tablet at 01/04/23 2206   ibuprofen (ADVIL) tablet 200 mg  200 mg Oral Q8H Levester FreshGawne, Meghan M, PA-C   200 mg at 01/05/23 1348   menthol-cetylpyridinium (CEPACOL) lozenge 3 mg  1 lozenge Oral PRN Levester FreshGawne, Meghan M, PA-C       Or   phenol (CHLORASEPTIC) mouth spray 1 spray  1 spray Mouth/Throat PRN Gawne, Lindie SpruceMeghan M, PA-C       methocarbamol (ROBAXIN) tablet 500 mg  500 mg Oral Q6H PRN Gawne, Meghan M, PA-C       Or   methocarbamol (ROBAXIN) 500 mg in dextrose 5 % 50 mL IVPB  500 mg Intravenous Q6H PRN Gawne, Meghan M, PA-C       metoCLOPramide (REGLAN) tablet 5-10 mg  5-10 mg Oral Q8H PRN Gawne, Meghan M, PA-C       Or   metoCLOPramide (REGLAN) injection 5-10 mg  5-10 mg Intravenous Q8H PRN Gawne, Meghan M, PA-C       mupirocin ointment (BACTROBAN) 2 % 1 Application  1 Application Nasal BID Jenne PaneGawne, Meghan M, PA-C   1 Application at 01/05/23 0821   ondansetron (ZOFRAN) tablet 4 mg  4 mg Oral Q6H PRN Gawne, Meghan M, PA-C       Or   ondansetron [facility](ZOFRAN) injection 4 mg  4 mg Intravenous Q6H PRN Gawne, Meghan M, PA-C       Oral care mouth rinse  15 mL Mouth Rinse PRN Gawne, Meghan M, PA-C       pantoprazole (PROTONIX) EC tablet  40 mg  40 mg Oral Daily Levester FreshGawne, Meghan M, PA-C   40 mg at 01/05/23 0820   polyethylene glycol (MIRALAX / GLYCOLAX) packet 17 g  17 g Oral Daily PRN Gawne, Lindie SpruceMeghan M, PA-C       polyethylene glycol (MIRALAX / GLYCOLAX) packet 17 g  17 g Oral Daily Evlyn KannerBraswell, Phillip, MD   17 g at 01/05/23 0820     Discharge Medications: Please see discharge summary for a list of discharge medications.  Relevant Imaging Results:  Relevant Lab Results:   Additional Information SS#: [id number]  Lorri FrederickWierda, Eleanora Guinyard Jon, LCSW

## 2023-01-05 NOTE — Progress Notes (Signed)
Occupational Therapy Treatment Patient Details Name: KERSHAW SPHAR MRN: [id number] DOB: May 24, 1956 Today's Date: 01/05/2023   History of present illness Pt is a 67 y/o M s/p Hemiarthroplasty L hip - WBAT. PMH includes Alzheimer's dementia, multiple falls, and malnutrition.   OT comments  Pt able to passively participate in therapy today, slow progress due to decreased cognition. Pt total A for bed mobility with scooting to EOB, sitting up, severe posterior leaning with sitting, constant support. Pt did not appear to have pain during movements, difficult to assess pain due to flat affect. Pt would greatly benefit from continued skilled therapy and post acute follow up to progress mobility and improve participation in functional tasks.    Recommendations for follow up therapy are one component of a multi-disciplinary discharge planning process, led by the attending physician.  Recommendations may be updated based on patient status, additional functional criteria and insurance authorization.    Assistance Recommended at Discharge Frequent or constant Supervision/Assistance  Patient can return home with the following  Two people to help with walking and/or transfers;Two people to help with bathing/dressing/bathroom;Assistance with cooking/housework;Help with stairs or ramp for entrance;Assist for transportation;Direct supervision/assist for financial management;Direct supervision/assist for medications management;Assistance with feeding   Equipment Recommendations       Recommendations for Other Services      Precautions / Restrictions Precautions Precautions: Fall Precaution Comments: posterior hip precautions Restrictions Weight Bearing Restrictions: No LLE Weight Bearing: Weight bearing as tolerated       Mobility Bed Mobility Overal bed mobility: Needs Assistance Bed Mobility: Supine to Sit     Supine to sit: Total assist, +2 for physical assistance, HOB elevated     General  bed mobility comments: total A for sitting EOB, unable to initiate tasks, posterior leaning sitting on EOB    Transfers                         Balance   Sitting-balance support: Bilateral upper extremity supported, Feet supported Sitting balance-Leahy Scale: Zero                                     ADL either performed or assessed with clinical judgement   ADL Overall ADL's : Needs assistance/impaired                                     Functional mobility during ADLs: Total assistance;+2 for physical assistance;+2 for safety/equipment General ADL Comments: Pt total A for all activities, total for sitting EOB today, posterior leaning    Extremity/Trunk Assessment Upper Extremity Assessment Upper Extremity Assessment: Difficult to assess due to impaired cognition            Vision       Perception     Praxis      Cognition Arousal/Alertness: Awake/alert Behavior During Therapy: Flat affect Overall Cognitive Status: History of cognitive impairments - at baseline                                 General Comments: unable to follow commands, respond to questions, initiation tasks, requires total A for mobility/ADLs        Exercises      Shoulder Instructions  General Comments      Pertinent Vitals/ Pain       Pain Assessment Pain Assessment: Faces Faces Pain Scale: Hurts a little bit  Home Living                                          Prior Functioning/Environment              Frequency  Min 2X/week        Progress Toward Goals  OT Goals(current goals can now be found in the care plan section)  Progress towards OT goals: Progressing toward goals (slowly due to decreased cognition)  Acute Rehab OT Goals OT Goal Formulation: Patient unable to participate in goal setting Time For Goal Achievement: 01/16/23 Potential to Achieve Goals: Good ADL Goals Pt Will  Perform Grooming: with mod assist;sitting Pt Will Perform Upper Body Dressing: with min assist;sitting Pt Will Perform Lower Body Dressing: with mod assist;sit to/from stand Pt Will Transfer to Toilet: with mod assist;stand pivot transfer;bedside commode Additional ADL Goal #1: Pt will complete bed mobility with min assist and maintain dynamic sitting balance at EOB with min guard for 5 minutes as precursor to ADLs. Additional ADL Goal #2: Pt will follow 1 step commands consistently with 50% accuracy given increased time.  Plan Discharge plan remains appropriate;Frequency remains appropriate    Co-evaluation                 AM-PAC OT "6 Clicks" Daily Activity     Outcome Measure   Help from another person eating meals?: Total Help from another person taking care of personal grooming?: Total Help from another person toileting, which includes using toliet, bedpan, or urinal?: Total Help from another person bathing (including washing, rinsing, drying)?: Total Help from another person to put on and taking off regular upper body clothing?: Total Help from another person to put on and taking off regular lower body clothing?: Total 6 Click Score: 6    End of Session    OT Visit Diagnosis: Other abnormalities of gait and mobility (R26.89);Muscle weakness (generalized) (M62.81);Pain;History of falling (Z91.81);Other symptoms and signs involving cognitive function   Activity Tolerance Patient limited by lethargy (limited due to severe decreased cognition)   Patient Left in bed;with call bell/phone within reach;with bed alarm set   Nurse Communication Mobility status        Time: 9983-3825 OT Time Calculation (min): 12 min  Charges: OT General Charges $OT Visit: 1 Visit OT Treatments $Therapeutic Activity: 8-22 mins  Ramona, OTR/L   Alexis Goodell 01/05/2023, 12:31 PM

## 2023-01-06 DIAGNOSIS — F0280 Dementia in other diseases classified elsewhere without behavioral disturbance: Secondary | ICD-10-CM | POA: Diagnosis not present

## 2023-01-06 DIAGNOSIS — G309 Alzheimer's disease, unspecified: Secondary | ICD-10-CM | POA: Diagnosis not present

## 2023-01-06 DIAGNOSIS — S7292XA Unspecified fracture of left femur, initial encounter for closed fracture: Secondary | ICD-10-CM | POA: Diagnosis not present

## 2023-01-06 MED ORDER — ACETAMINOPHEN 325 MG PO TABS: 650.0000 mg | ORAL_TABLET | Freq: Four times a day (QID) | ORAL | Status: AC | PRN

## 2023-01-06 NOTE — Care Management Important Message (Signed)
Important Message  Patient Details  Name: TOBY SELINSKY MRN: [id number] Date of Birth: 1955/11/23   Medicare Important Message Given:  Yes     Sherilyn Banker 01/06/2023, 1:14 PM

## 2023-01-06 NOTE — TOC Progression Note (Signed)
Transition of Care [facility]) - Progression Note    Patient Details  Name: Paul Klein MRN: [id number] Date of Birth: [birth year]  Transition of Care [facility]) CM/SW Contact  Lorri Frederick, LCSW Phone Number: 01/06/2023, 11:26 AM  Clinical Narrative:    CSW confirmed pt can return without any catheter.   CSW contacted by [facility] who was contacted by [facility] house regarding [facility] PT--HH orders requested. Updated FL2 and DC summary faxed to [facility] house. 1115: [facility].  Clarified pt will not return with catheter.  They are ready to receive pt.      Expected Discharge Plan: Skilled Nursing Facility Barriers to Discharge: Barriers Resolved  Expected Discharge Plan and Services In-house Referral: Clinical Social Work   Post Acute Care Choice: Skilled Nursing Facility Living arrangements for the past 2 months: Assisted Living Facility [facility] memory care) Expected Discharge Date: 01/06/23                         HH Arranged: PT, OT HH Agency: [facility] Date HH Agency Contacted: 01/06/23 Time HH Agency Contacted: 0915 Representative spoke with at [facility] Agency: Tresa Endo   Social Determinants of Health (SDOH) Interventions SDOH Screenings   Depression (PHQ2-9): Low Risk  (06/02/2021)  Tobacco Use: Medium Risk (01/03/2023)    Readmission Risk Interventions     No data to display

## 2023-01-06 NOTE — TOC Transition Note (Addendum)
Transition of Care [facility]) - CM/SW Discharge Note   Patient Details  Name: Paul Klein MRN: [id number] Date of Birth: 04-Nov-1955  [facility]) CM/SW Contact:  Lorri Frederick, LCSW Phone Number: 01/06/2023, 10:45 AM   Clinical Narrative:   Pt discharging to [facility] care.  RN call report to [phone].    Final next level of care: Memory Care Barriers to Discharge: Barriers Resolved   Patient Goals and CMS Choice CMS Medicare.gov Compare Post Acute Care list provided to:: Patient Represenative (must comment) (wife Diane, daughter [name]) Choice offered to / list presented to : Spouse  Discharge Placement                Patient chooses bed at:  [facility]) Patient to be transferred to facility by: [facility] Name of family member notified: daughter [name] Patient and family notified of of transfer: 01/06/23  Discharge Plan and Services Additional resources added to the After Visit Summary for   In-house Referral: Clinical Social Work   Post Acute Care Choice: Skilled Nursing Facility                    HH Arranged: PT, OT [facility] Agency: [facility] Date [facility] Agency Contacted: 01/06/23 Time HH Agency Contacted: [phone] Representative spoke with at [facility] Agency: Tresa Endo  Social Determinants of Health (SDOH) Interventions SDOH Screenings   Depression (PHQ2-9): Low Risk  (06/02/2021)  Tobacco Use: Medium Risk (01/03/2023)     Readmission Risk Interventions     No data to display

## 2023-01-11 ENCOUNTER — Telehealth: Payer: Self-pay

## 2023-01-11 NOTE — Telephone Encounter (Signed)
Delice Bison notified of one time VO.

## 2023-01-11 NOTE — Telephone Encounter (Signed)
Returned call to Paul Klein from [employer]. Patient was on our IMTS but has no appts with [facility]. Patient currently at [facility]. Paul Klein has been unable to locate patient's PCP and she is asking for one time VO till she can locate PCP.  She is requesting VO for [facility] PT 2 week 2 and 1 week 6 to work on ROM, strength, balance, transfers, and gait.

## 2023-01-11 NOTE — Telephone Encounter (Signed)
Delice Bison from [facility] called she is requesting verbal orders for patient, please return her call @ [phone], if she doesn't answer please leave a voicemail.

## 2023-01-24 ENCOUNTER — Emergency Department (HOSPITAL_COMMUNITY): Payer: Medicare HMO

## 2023-01-24 ENCOUNTER — Other Ambulatory Visit: Payer: Self-pay

## 2023-01-24 ENCOUNTER — Inpatient Hospital Stay (HOSPITAL_COMMUNITY)
Admission: EM | Admit: 2023-01-24 | Discharge: 2023-02-11 | DRG: 871 | Disposition: A | Discharge status: Hospice | Payer: Medicare HMO | Source: Skilled Nursing Facility | Attending: Internal Medicine | Admitting: Internal Medicine

## 2023-01-24 DIAGNOSIS — Z818 Family history of other mental and behavioral disorders: Secondary | ICD-10-CM

## 2023-01-24 DIAGNOSIS — F03918 Unspecified dementia, unspecified severity, with other behavioral disturbance: Secondary | ICD-10-CM | POA: Diagnosis present

## 2023-01-24 DIAGNOSIS — N3001 Acute cystitis with hematuria: Secondary | ICD-10-CM

## 2023-01-24 DIAGNOSIS — N320 Bladder-neck obstruction: Secondary | ICD-10-CM | POA: Diagnosis present

## 2023-01-24 DIAGNOSIS — K5900 Constipation, unspecified: Secondary | ICD-10-CM | POA: Diagnosis present

## 2023-01-24 DIAGNOSIS — D649 Anemia, unspecified: Secondary | ICD-10-CM | POA: Diagnosis present

## 2023-01-24 DIAGNOSIS — Z7982 Long term (current) use of aspirin: Secondary | ICD-10-CM

## 2023-01-24 DIAGNOSIS — N390 Urinary tract infection, site not specified: Secondary | ICD-10-CM | POA: Diagnosis present

## 2023-01-24 DIAGNOSIS — A4151 Sepsis due to Escherichia coli [E. coli]: Secondary | ICD-10-CM | POA: Diagnosis not present

## 2023-01-24 DIAGNOSIS — F02C4 Dementia in other diseases classified elsewhere, severe, with anxiety: Secondary | ICD-10-CM | POA: Diagnosis present

## 2023-01-24 DIAGNOSIS — G309 Alzheimer's disease, unspecified: Secondary | ICD-10-CM | POA: Diagnosis present

## 2023-01-24 DIAGNOSIS — G9341 Metabolic encephalopathy: Secondary | ICD-10-CM | POA: Diagnosis present

## 2023-01-24 DIAGNOSIS — G4700 Insomnia, unspecified: Secondary | ICD-10-CM | POA: Diagnosis present

## 2023-01-24 DIAGNOSIS — E785 Hyperlipidemia, unspecified: Secondary | ICD-10-CM | POA: Diagnosis present

## 2023-01-24 DIAGNOSIS — R339 Retention of urine, unspecified: Secondary | ICD-10-CM | POA: Diagnosis present

## 2023-01-24 DIAGNOSIS — Z79899 Other long term (current) drug therapy: Secondary | ICD-10-CM

## 2023-01-24 DIAGNOSIS — R4182 Altered mental status, unspecified: Secondary | ICD-10-CM

## 2023-01-24 DIAGNOSIS — F02C18 Dementia in other diseases classified elsewhere, severe, with other behavioral disturbance: Secondary | ICD-10-CM | POA: Diagnosis present

## 2023-01-24 DIAGNOSIS — Z1612 Extended spectrum beta lactamase (ESBL) resistance: Secondary | ICD-10-CM | POA: Diagnosis present

## 2023-01-24 DIAGNOSIS — Z515 Encounter for palliative care: Secondary | ICD-10-CM

## 2023-01-24 DIAGNOSIS — Z833 Family history of diabetes mellitus: Secondary | ICD-10-CM

## 2023-01-24 DIAGNOSIS — S97111A Crushing injury of right great toe, initial encounter: Secondary | ICD-10-CM | POA: Diagnosis present

## 2023-01-24 DIAGNOSIS — B9620 Unspecified Escherichia coli [E. coli] as the cause of diseases classified elsewhere: Secondary | ICD-10-CM | POA: Diagnosis present

## 2023-01-24 DIAGNOSIS — Z8249 Family history of ischemic heart disease and other diseases of the circulatory system: Secondary | ICD-10-CM

## 2023-01-24 DIAGNOSIS — S92424A Nondisplaced fracture of distal phalanx of right great toe, initial encounter for closed fracture: Secondary | ICD-10-CM | POA: Diagnosis present

## 2023-01-24 DIAGNOSIS — N3289 Other specified disorders of bladder: Secondary | ICD-10-CM | POA: Diagnosis present

## 2023-01-24 DIAGNOSIS — Z7401 Bed confinement status: Secondary | ICD-10-CM

## 2023-01-24 DIAGNOSIS — Z1152 Encounter for screening for COVID-19: Secondary | ICD-10-CM

## 2023-01-24 DIAGNOSIS — Z66 Do not resuscitate: Secondary | ICD-10-CM | POA: Diagnosis present

## 2023-01-24 DIAGNOSIS — E876 Hypokalemia: Secondary | ICD-10-CM | POA: Diagnosis present

## 2023-01-24 DIAGNOSIS — Z8781 Personal history of (healed) traumatic fracture: Secondary | ICD-10-CM

## 2023-01-24 DIAGNOSIS — Z96642 Presence of left artificial hip joint: Secondary | ICD-10-CM | POA: Diagnosis present

## 2023-01-24 DIAGNOSIS — R6520 Severe sepsis without septic shock: Secondary | ICD-10-CM | POA: Diagnosis present

## 2023-01-24 DIAGNOSIS — N136 Pyonephrosis: Secondary | ICD-10-CM | POA: Diagnosis present

## 2023-01-24 DIAGNOSIS — A419 Sepsis, unspecified organism: Secondary | ICD-10-CM | POA: Diagnosis present

## 2023-01-24 DIAGNOSIS — R7881 Bacteremia: Secondary | ICD-10-CM | POA: Diagnosis present

## 2023-01-24 DIAGNOSIS — Z823 Family history of stroke: Secondary | ICD-10-CM

## 2023-01-24 DIAGNOSIS — W230XXA Caught, crushed, jammed, or pinched between moving objects, initial encounter: Secondary | ICD-10-CM | POA: Diagnosis present

## 2023-01-24 LAB — APTT: aPTT: 21 seconds — ABNORMAL LOW (ref 24–36)

## 2023-01-24 LAB — PROTIME-INR
INR: 1.2 (ref 0.8–1.2)
Prothrombin Time: 14.9 seconds (ref 11.4–15.2)

## 2023-01-24 MED ORDER — LACTATED RINGERS IV BOLUS (SEPSIS): 1000.0000 mL | Freq: Once | INTRAVENOUS | Status: DC

## 2023-01-24 MED ORDER — METRONIDAZOLE 500 MG/100ML IV SOLN
500.0000 mg | Freq: Once | INTRAVENOUS | Status: AC
  Administered 2023-01-24: 500 mg via INTRAVENOUS
  Filled 2023-01-24: qty 100

## 2023-01-24 MED ORDER — VANCOMYCIN HCL IN DEXTROSE 1-5 GM/200ML-% IV SOLN: 1000.0000 mg | Freq: Once | INTRAVENOUS | Status: DC

## 2023-01-24 MED ORDER — ACETAMINOPHEN 650 MG RE SUPP
650.0000 mg | RECTAL | Status: AC
  Administered 2023-01-24: 650 mg via RECTAL
  Filled 2023-01-24: qty 1

## 2023-01-24 MED ORDER — SODIUM CHLORIDE 0.9 % IV SOLN
2.0000 g | Freq: Once | INTRAVENOUS | Status: AC
  Administered 2023-01-24: 2 g via INTRAVENOUS
  Filled 2023-01-24: qty 12.5

## 2023-01-24 MED ORDER — LACTATED RINGERS IV BOLUS (SEPSIS)
1000.0000 mL | Freq: Once | INTRAVENOUS | Status: AC
  Administered 2023-01-24: 1000 mL via INTRAVENOUS

## 2023-01-24 MED ORDER — LACTATED RINGERS IV SOLN: INTRAVENOUS | Status: DC

## 2023-01-24 MED ORDER — VANCOMYCIN HCL 1500 MG/300ML IV SOLN
1500.0000 mg | Freq: Once | INTRAVENOUS | Status: AC
  Administered 2023-01-25: 1500 mg via INTRAVENOUS
  Filled 2023-01-24: qty 300

## 2023-01-24 NOTE — ED Triage Notes (Addendum)
Patient arrived with EMS from [facility] staff reported increasing confusion/disoriented with agitation onset this morning with fever ( T= 102.3) , CBG= 151, history of UTIs , received approx. 800 ml LR prior to arrival . History of Alzheimer's dementia .

## 2023-01-25 ENCOUNTER — Emergency Department (HOSPITAL_COMMUNITY): Payer: Medicare HMO

## 2023-01-25 DIAGNOSIS — S92424A Nondisplaced fracture of distal phalanx of right great toe, initial encounter for closed fracture: Secondary | ICD-10-CM | POA: Diagnosis present

## 2023-01-25 DIAGNOSIS — A419 Sepsis, unspecified organism: Secondary | ICD-10-CM

## 2023-01-25 DIAGNOSIS — N390 Urinary tract infection, site not specified: Secondary | ICD-10-CM | POA: Diagnosis not present

## 2023-01-25 DIAGNOSIS — R339 Retention of urine, unspecified: Secondary | ICD-10-CM | POA: Diagnosis present

## 2023-01-25 DIAGNOSIS — D649 Anemia, unspecified: Secondary | ICD-10-CM | POA: Diagnosis present

## 2023-01-25 LAB — BLOOD CULTURE ID PANEL (REFLEXED) - BCID2

## 2023-01-25 LAB — CBC WITH DIFFERENTIAL/PLATELET
Abs Immature Granulocytes: 0.05 10*3/uL (ref 0.00–0.07)
Abs Immature Granulocytes: 0.03 10*3/uL (ref 0.00–0.07)
Basophils Absolute: 0 10*3/uL (ref 0.0–0.1)
Basophils Absolute: 0 10*3/uL (ref 0.0–0.1)
Basophils Relative: 0 %
Basophils Relative: 0 %
Eosinophils Absolute: 0 10*3/uL (ref 0.0–0.5)
Eosinophils Absolute: 0 10*3/uL (ref 0.0–0.5)
Eosinophils Relative: 0 %
Eosinophils Relative: 0 %
HCT: 28.9 % — ABNORMAL LOW (ref 39.0–52.0)
HCT: 32 % — ABNORMAL LOW (ref 39.0–52.0)
Hemoglobin: 9.4 g/dL — ABNORMAL LOW (ref 13.0–17.0)
Hemoglobin: 10.2 g/dL — ABNORMAL LOW (ref 13.0–17.0)
Immature Granulocytes: 0 %
Immature Granulocytes: 0 %
Lymphocytes Relative: 6 %
Lymphocytes Relative: 7 %
Lymphs Abs: 0.8 10*3/uL (ref 0.7–4.0)
Lymphs Abs: 0.7 10*3/uL (ref 0.7–4.0)
MCH: 27.4 pg (ref 26.0–34.0)
MCH: 27.1 pg (ref 26.0–34.0)
MCHC: 32.5 g/dL (ref 30.0–36.0)
MCHC: 31.9 g/dL (ref 30.0–36.0)
MCV: 84.3 fL (ref 80.0–100.0)
MCV: 84.9 fL (ref 80.0–100.0)
Monocytes Absolute: 1 10*3/uL (ref 0.1–1.0)
Monocytes Absolute: 0.4 10*3/uL (ref 0.1–1.0)
Monocytes Relative: 8 %
Monocytes Relative: 4 %
Neutro Abs: 10.4 10*3/uL — ABNORMAL HIGH (ref 1.7–7.7)
Neutro Abs: 9.6 10*3/uL — ABNORMAL HIGH (ref 1.7–7.7)
Neutrophils Relative %: 86 %
Neutrophils Relative %: 89 %
Platelets: 269 10*3/uL (ref 150–400)
Platelets: 251 10*3/uL (ref 150–400)
RBC: 3.43 MIL/uL — ABNORMAL LOW (ref 4.22–5.81)
RBC: 3.77 MIL/uL — ABNORMAL LOW (ref 4.22–5.81)
RDW: 16.2 % — ABNORMAL HIGH (ref 11.5–15.5)
RDW: 16.4 % — ABNORMAL HIGH (ref 11.5–15.5)
WBC: 12.3 10*3/uL — ABNORMAL HIGH (ref 4.0–10.5)
WBC: 10.8 10*3/uL — ABNORMAL HIGH (ref 4.0–10.5)
nRBC: 0 % (ref 0.0–0.2)
nRBC: 0 % (ref 0.0–0.2)

## 2023-01-25 LAB — COMPREHENSIVE METABOLIC PANEL
ALT: 12 U/L (ref 0–44)
ALT: 10 U/L (ref 0–44)
AST: 27 U/L (ref 15–41)
AST: 20 U/L (ref 15–41)
Albumin: 2.7 g/dL — ABNORMAL LOW (ref 3.5–5.0)
Albumin: 2.7 g/dL — ABNORMAL LOW (ref 3.5–5.0)
Alkaline Phosphatase: 113 U/L (ref 38–126)
Alkaline Phosphatase: 112 U/L (ref 38–126)
Anion gap: 10 (ref 5–15)
Anion gap: 13 (ref 5–15)
BUN: 8 mg/dL (ref 8–23)
BUN: 9 mg/dL (ref 8–23)
CO2: 24 mmol/L (ref 22–32)
CO2: 23 mmol/L (ref 22–32)
Calcium: 8.6 mg/dL — ABNORMAL LOW (ref 8.9–10.3)
Calcium: 8.7 mg/dL — ABNORMAL LOW (ref 8.9–10.3)
Chloride: 100 mmol/L (ref 98–111)
Chloride: 100 mmol/L (ref 98–111)
Creatinine, Ser: 0.87 mg/dL (ref 0.61–1.24)
Creatinine, Ser: 0.94 mg/dL (ref 0.61–1.24)
GFR, Estimated: >60 mL/min (ref >60)
GFR, Estimated: >60 mL/min (ref >60)
Glucose, Bld: 129 mg/dL — ABNORMAL HIGH (ref 70–99)
Glucose, Bld: 129 mg/dL — ABNORMAL HIGH (ref 70–99)
Potassium: 3.5 mmol/L (ref 3.5–5.1)
Potassium: 3.5 mmol/L (ref 3.5–5.1)
Sodium: 134 mmol/L — ABNORMAL LOW (ref 135–145)
Sodium: 136 mmol/L (ref 135–145)
Total Bilirubin: 1.4 mg/dL — ABNORMAL HIGH (ref 0.3–1.2)
Total Bilirubin: 1.1 mg/dL (ref 0.3–1.2)
Total Protein: 5.3 g/dL — ABNORMAL LOW (ref 6.5–8.1)
Total Protein: 5.5 g/dL — ABNORMAL LOW (ref 6.5–8.1)

## 2023-01-25 LAB — LACTIC ACID, PLASMA
Lactic Acid, Venous: 1.9 mmol/L (ref 0.5–1.9)
Lactic Acid, Venous: 3.3 mmol/L (ref 0.5–1.9)
Lactic Acid, Venous: 1.6 mmol/L (ref 0.5–1.9)
Lactic Acid, Venous: 1 mmol/L (ref 0.5–1.9)

## 2023-01-25 LAB — URINALYSIS, W/ REFLEX TO CULTURE (INFECTION SUSPECTED)
Bilirubin Urine: NEGATIVE
Glucose, UA: NEGATIVE mg/dL
Ketones, ur: 5 mg/dL — AB
Nitrite: POSITIVE — AB
Protein, ur: 100 mg/dL — AB
Specific Gravity, Urine: 1.01 (ref 1.005–1.030)
WBC, UA: >50 WBC/hpf (ref 0–5)
pH: 5 (ref 5.0–8.0)

## 2023-01-25 LAB — CULTURE, BLOOD (ROUTINE X 2): Special Requests: ADEQUATE

## 2023-01-25 LAB — GLUCOSE, CAPILLARY: Glucose-Capillary: 119 mg/dL — ABNORMAL HIGH (ref 70–99)

## 2023-01-25 LAB — RESP PANEL BY RT-PCR (RSV, FLU A&B, COVID)  RVPGX2
Influenza A by PCR: NEGATIVE
Influenza B by PCR: NEGATIVE
Resp Syncytial Virus by PCR: NEGATIVE
SARS Coronavirus 2 by RT PCR: NEGATIVE

## 2023-01-25 LAB — URINE CULTURE

## 2023-01-25 MED ORDER — SENNOSIDES-DOCUSATE SODIUM 8.6-50 MG PO TABS
2.0000 | ORAL_TABLET | Freq: Once | ORAL | Status: AC
  Administered 2023-01-25: 2 via ORAL
  Filled 2023-01-25: qty 2

## 2023-01-25 MED ORDER — LACTATED RINGERS IV SOLN: INTRAVENOUS | Status: DC

## 2023-01-25 MED ORDER — ATORVASTATIN CALCIUM 10 MG PO TABS
20.0000 mg | ORAL_TABLET | Freq: Every day | ORAL | Status: DC
  Administered 2023-01-25 – 2023-02-10 (×16): 20 mg via ORAL
  Filled 2023-01-25 (×17): qty 2

## 2023-01-25 MED ORDER — ENOXAPARIN SODIUM 40 MG/0.4ML IJ SOSY
40.0000 mg | PREFILLED_SYRINGE | INTRAMUSCULAR | Status: DC
  Administered 2023-01-25 – 2023-02-10 (×16): 40 mg via SUBCUTANEOUS
  Filled 2023-01-25 (×17): qty 0.4

## 2023-01-25 MED ORDER — ACETAMINOPHEN 500 MG PO TABS: 1000.0000 mg | ORAL_TABLET | Freq: Three times a day (TID) | ORAL | Status: DC

## 2023-01-25 MED ORDER — MUPIROCIN CALCIUM 2 % EX CREA
TOPICAL_CREAM | Freq: Every day | CUTANEOUS | Status: DC
  Administered 2023-02-01: 1 via TOPICAL
  Filled 2023-01-25 (×2): qty 15

## 2023-01-25 MED ORDER — POLYETHYLENE GLYCOL 3350 17 G PO PACK
17.0000 g | PACK | Freq: Every day | ORAL | Status: DC
  Administered 2023-01-26: 17 g via ORAL
  Filled 2023-01-25: qty 1

## 2023-01-25 MED ORDER — ACETAMINOPHEN 10 MG/ML IV SOLN: 1000.0000 mg | Freq: Three times a day (TID) | INTRAVENOUS | Status: DC

## 2023-01-25 MED ORDER — OXYCODONE HCL 5 MG PO TABS
5.0000 mg | ORAL_TABLET | Freq: Four times a day (QID) | ORAL | Status: DC | PRN
  Administered 2023-01-27 – 2023-01-30 (×2): 5 mg via ORAL
  Filled 2023-01-25 (×2): qty 1

## 2023-01-25 MED ORDER — SENNOSIDES-DOCUSATE SODIUM 8.6-50 MG PO TABS: 2.0000 | ORAL_TABLET | Freq: Every day | ORAL | Status: DC

## 2023-01-25 MED ORDER — BISACODYL 10 MG RE SUPP: 10.0000 mg | Freq: Once | RECTAL | Status: DC

## 2023-01-25 MED ORDER — SODIUM CHLORIDE 0.9 % IV SOLN
1.0000 g | INTRAVENOUS | Status: DC
  Administered 2023-01-25: 1 g via INTRAVENOUS
  Filled 2023-01-25: qty 10

## 2023-01-25 MED ORDER — ACETAMINOPHEN 500 MG PO TABS
1000.0000 mg | ORAL_TABLET | Freq: Three times a day (TID) | ORAL | Status: DC
  Administered 2023-01-25 – 2023-02-10 (×41): 1000 mg via ORAL
  Filled 2023-01-25 (×51): qty 2

## 2023-01-25 MED ORDER — IOHEXOL 350 MG/ML SOLN
75.0000 mL | Freq: Once | INTRAVENOUS | Status: AC | PRN
  Administered 2023-01-25: 75 mL via INTRAVENOUS

## 2023-01-25 MED ORDER — SODIUM CHLORIDE 0.9 % IV SOLN
1.0000 g | Freq: Three times a day (TID) | INTRAVENOUS | Status: AC
  Administered 2023-01-25 – 2023-01-30 (×17): 1 g via INTRAVENOUS
  Filled 2023-01-25 (×17): qty 20

## 2023-01-25 NOTE — H&P (Addendum)
Date: 01/25/2023               Patient Name:  Paul Klein MRN: [id number]  DOB: 1955/12/03 Age / Sex: 67 y.o., male   PCP: System, Provider Not In         Medical Service: Internal Medicine Teaching Service         Attending Physician: Dr. Oswaldo Done, Marquita Palms, *      First Contact: Dr. Morene Crocker, MD Pager [phone]    Second Contact: Dr. Elza Rafter, DO Pager [phone]         After Hours (After 5p/  First Contact Pager: [phone]  weekends / holidays): Second Contact Pager: [phone]   SUBJECTIVE   Chief Complaint: Altered Mental Status  History of Present Illness:  Paul Klein is a 67 y/o male with past medical history of Alzheimer's dementia and recurrent falls with left hip fracture s/p surgery on 01/01/2023 presenting from [facility] house SNF for altered mental status.  Patient is unable to provide any history.  History is obtained from ED, ED discussions with SNF staff, and our discussion with patient's spouse.  He was discharged on 01/06/2023 and transferred to SNF.  He was more somnolent at his SNF and when they checked his temperature it was elevated.  He got 1 L of fluid by EMS and arrived to the ED with a fever of 102.6 F and was tachypneic and tachycardic.  He was found to have scrape wounds on his left heel and posterior calf and a crush injury of his right great toe.  We discussed these injuries with his spouse who was unaware of them until the ED told her about them.  On CT scan they found significant bladder distention, large stool burden, and a 6 x 5 x 2 cm hematoma posterior to his left femur.  A Foley catheter was placed and we were paged for admission.  Past Medical History Alzheimer's dementia Hyperlipidemia Anxiety/insomnia  Meds:  Tylenol 650 mg every 6 hours as needed Aspirin 81 mg twice daily Atorvastatin 20 mg daily Clonazepam 0.5 mg nightly  Past Surgical History Past Surgical History:  Procedure Laterality Date   ANAL FISSURE REPAIR      COLONOSCOPY  02/19/13   diverticulosis of sigmoid colon, external and internal hemorrhoids, Dr. Elnoria Howard, repeat in 10 years   HIP ARTHROPLASTY Left 01/01/2023   Procedure: ARTHROPLASTY BIPOLAR HIP (HEMIARTHROPLASTY);  Surgeon: Sheral Apley, MD;  Location: [facility];  Service: Orthopedics;  Laterality: Left;   SKIN BIOPSY     face, back   WISDOM TOOTH EXTRACTION      Social:  Lives in a care unit at [facility] house Support: Wife and daughter in [place] Level of Function: Dependent in ADLs and IADLs  Family History:  Family History  Problem Relation Age of Onset   Heart disease Mother        BYPASS HEART SURGERY   Depression Mother    Mental illness Mother    Cancer Mother        skin   Dementia Mother    Hypertension Father    Benign prostatic hyperplasia Father    Cancer Father        skin   Heart disease Father 81       CABG   Depression Sister    Stroke Maternal Grandmother    Diabetes Paternal Grandfather      Allergies: Allergies as of 01/24/2023 - Review Complete 01/01/2023  Allergen Reaction Noted  Prozac [fluoxetine hcl]  12/25/2012    Review of Systems: A complete ROS was negative except as per HPI.   OBJECTIVE:   Physical Exam: Blood pressure (!) 117/54, pulse 95, temperature 99.4 F (37.4 C), temperature source Oral, resp. rate 18, height 6' (1.829 m), weight 72 kg, SpO2 98 %.  Constitutional: Uncomfortable appearing elderly male laying in bed with intermittent shivering. In no acute distress. HENT: Normocephalic, atraumatic,  Eyes: Sclera non-icteric, PERRL, EOM intact Cardio:Regular rate and rhythm. 2+ bilateral radial and dorsalis pedis  pulses. Pulm: Anterior lung fields clear to auscultation bilaterally. Normal work of breathing on room air. Abdomen: Mildly distended and slightly firm abdomen with no focal tenderness but possibly diffuse tenderness.  No voluntary or involuntary guarding.  Positive bowel sounds MSK: Trace lower extremity edema  bilaterally.  Scrape wounds extending from the left heel to the mid left posterior calf with surrounding erythema but no significant edema or purulent discharge.  Crush wound to the right great toe with a laceration just distal to the toenail without active bleeding or purulent discharge. Neuro: Awake and responds with unintelligible verbiage when asked questions.  Does not consistently follow commands.  Unable to perform adequate neuroexam. GU: Rectal exam technically limited due to difficulty positioning patient.  No dark or bright red blood.  Light brown stool noted.  Labs: CBC    Component Value Date/Time   WBC 12.3 (H) 01/24/2023 2315   RBC 3.43 (L) 01/24/2023 2315   HGB 9.4 (L) 01/24/2023 2315   HGB 14.7 11/30/2020 1154   HCT 28.9 (L) 01/24/2023 2315   HCT 44.4 11/30/2020 1154   PLT 269 01/24/2023 2315   PLT 208 11/30/2020 1154   MCV 84.3 01/24/2023 2315   MCV 92 11/30/2020 1154   MCH 27.4 01/24/2023 2315   MCHC 32.5 01/24/2023 2315   RDW 16.2 (H) 01/24/2023 2315   RDW 13.0 11/30/2020 1154   LYMPHSABS 0.8 01/24/2023 2315   LYMPHSABS 1.2 04/29/2020 0911   MONOABS 1.0 01/24/2023 2315   EOSABS 0.0 01/24/2023 2315   EOSABS 0.0 04/29/2020 0911   BASOSABS 0.0 01/24/2023 2315   BASOSABS 0.0 04/29/2020 0911     CMP     Component Value Date/Time   NA 134 (L) 01/24/2023 2315   NA 142 11/30/2020 1154   K 3.5 01/24/2023 2315   CL 100 01/24/2023 2315   CO2 24 01/24/2023 2315   GLUCOSE 129 (H) 01/24/2023 2315   BUN 8 01/24/2023 2315   BUN 9 11/30/2020 1154   CREATININE 0.87 01/24/2023 2315   CREATININE 0.94 08/31/2017 0918   CALCIUM 8.6 (L) 01/24/2023 2315   PROT 5.3 (L) 01/24/2023 2315   PROT 6.9 11/30/2020 1154   ALBUMIN 2.7 (L) 01/24/2023 2315   ALBUMIN 4.9 (H) 11/30/2020 1154   AST 27 01/24/2023 2315   ALT 12 01/24/2023 2315   ALKPHOS 113 01/24/2023 2315   BILITOT 1.4 (H) 01/24/2023 2315   BILITOT 0.4 11/30/2020 1154   GFRNONAA >60 01/24/2023 2315   GFRAA 96  04/29/2020 0911    Imaging: CT ABDOMEN PELVIS W CONTRAST Result Date: 01/25/2023 IMPRESSION: 1. Moderate bladder distention with symmetric mild bilateral hydroureteronephrosis. No stone disease. Findings could be due to bladder outlet obstruction or neurogenic bladder. Correlate clinically for cystitis. 2. Mild prostatomegaly. 3. Constipation and diverticulosis. 4. Aortic and coronary artery atherosclerosis. 5. Left hip arthroplasty with 6.0 x 2.8 x 5.1 cm hypodense collection posterior to the proximal left femoral metaphysis, probably a hematoma. Infectious complication not  strictly excluded. 6. Mild hepatic steatosis. Aortic Atherosclerosis (ICD10-I70.0).Electronically Signed   By: Almira Bar M.D.   On: 01/25/2023 03:15   DG Toe Great Right Result Date: 01/25/2023 IMPRESSION: First distal phalangeal fracture with evidence of soft tissue swelling.  Electronically Signed   By: Alcide Clever M.D.   On: 01/25/2023 02:12   DG Hip Unilat W or Wo Pelvis 2-3 Views Left Result Date: 01/25/2023 IMPRESSION: Left hip replacement. No acute osseous abnormality.  Electronically Signed   By: Jasmine Pang M.D.   On: 01/25/2023 00:05   DG Foot Complete Right Result Date: 01/25/2023 IMPRESSION: Limited by technique and position. No gross acute osseous abnormality.  Electronically Signed   By: Jasmine Pang M.D.   On: 01/25/2023 00:02   DG Chest Port 1 View Result Date: 01/24/2023 IMPRESSION: No active disease.  Electronically Signed   By: Jasmine Pang M.D.   On: 01/24/2023 23:59     EKG: personally reviewed my interpretation is sinus rhythm with significant artifact and occasional PAC. Consistent with prior EKG with addition of PACs.  ASSESSMENT & PLAN:   Assessment & Plan by Problem: Principal Problem:   Sepsis (HCC) Active Problems:   Constipation   Dementia with behavioral disturbance (HCC)   UTI (urinary tract infection)   S/p left hip fracture   Urinary retention   Normocytic anemia    Nondisplaced fracture of distal phalanx of right great toe, initial encounter for closed fracture   AMEIR FARIA is a 67 y.o. male with pertinent PMH of Alzheimer's dementia and recent surgery for left hip fracture who presented with altered mental status and is admitted for sepsis likely secondary to UTI.  Sepsis UTI Meets criteria for severe sepsis due to elevated temperature, tachypnea, tachycardia, decreased blood pressure, leukocytosis, altered mental status, and source of infection.  Lactic acid was initially 1.9 and after fluids increased to 3.3.  Unable to assess for other urinary tract infection symptoms due to patient's mental status.  Presentation complicated by constipation that may be causing discomfort and less altered mental status but also may be causing bladder outlet obstruction and increasing his risk of UTI.  Received cefepime, vancomycin, and metronidazole in the ED after blood cultures.  Does not appear to be another clear source of infection at this time.  Not in septic shock but remains altered. - Follow-up blood cultures - Continue with ceftriaxone 1 g every 24 hours - Address constipation with manual disimpaction and Senokot-S/MiraLAX - Monitor for fever and repeat CBC in the morning - Repeat lactic acid - Continue LR 150/h  Urinary retention Constipation CT scan shows significant stool burden in his distal colon and distended bladder.  Unknown last bowel movement or urinary habits at the SNF.  No significant AKI.  Due to the potential of his constipation causing majority of his problems we will address it tonight with manual disimpaction and bowel regimen. - Manual disimpaction - Senokot-S and MiraLAX  S/p left hip fracture with surgery on 01/01/2023 Normocytic anemia Postsurgical hematoma Appears to be healing well after left hip surgery and has no signs of infection on CT scan but does have a hematoma that measures about 6 x 5 x 2 cm.  Unknown if this was there  prior.  His hemoglobin has dropped from 12.8 on 01/04/2023 to 9.4 here.  No gross hematuria but there is microscopic hematuria on UA.  No blood on rectal exam.  No other source of bleeding seen on CT of the abdomen  and pelvis.  Coags within normal limits.  Will continue to monitor for any signs of bleeding and repeat CBC in the morning.  He is on aspirin 81 mg twice daily for DVT prophylaxis for 30 days after surgery.  Due to his drop in hemoglobin we will give this to him tonight and will reassess after CBC in the morning.  Right 1st Toe Distal Phalanx Fracture LLE soft tissue injuries Unknown cause of injuries but no signs of infection and no need for surgical intervention or laceration repair at this time.  - Daily Wound care  Anxiety Insomnia Patient takes Klonopin which had been titrated down to 0.5 mg nightly at his last hospitalization.  Tonight we will not resume it but plan to resume it tomorrow.  Diet:  DYS 1 VTE: None IVF: LR Code: DNR  Dispo: Admit patient to Observation with expected length of stay less than 2 midnights.  Signed: Rocky Morel, DO Internal Medicine Resident PGY-1  01/25/2023, 4:58 AM   Dr. Morene Crocker, MD Pager [phone]

## 2023-01-25 NOTE — TOC Initial Note (Signed)
Transition of Care [facility]) - Initial/Assessment Note    Patient Details  Name: Paul Klein MRN: [id number] Date of Birth: Feb 03, 1956  Transition of Care [facility]) CM/SW Contact:    Ralene Bathe, LCSWA Phone Number: 01/25/2023, 3:53 PM  Clinical Narrative:                 LCSW contacted the patient's spouse to discuss disposition.  The spouse reports that the patient will return to [facility] unit.  The spouse reports that the facility has been providing total care for the patient since his recent hip replacement.  TOC will continue to follow.   Expected Discharge Plan: Memory Care Barriers to Discharge: Continued Medical Work up   Patient Goals and CMS Choice   CMS Medicare.gov Compare Post Acute Care list provided to:: Patient Represenative (must comment) Choice offered to / list presented to : Spouse      Expected Discharge Plan and Services In-house Referral: Clinical Social Work   Post Acute Care Choice: Nursing Home Living arrangements for the past 2 months: Assisted Living Facility                                      Prior Living Arrangements/Services Living arrangements for the past 2 months: Assisted Living Facility Lives with:: Facility Resident Patient language and need for interpreter reviewed:: No Do you feel safe going back to the place where you live?: Yes      Need for Family Participation in Patient Care: Yes (Comment) Care giver support system in place?: Yes (comment)   Criminal Activity/Legal Involvement Pertinent to Current Situation/Hospitalization: No - Comment as needed  Activities of Daily Living      Permission Sought/Granted   Permission granted to share information with : Yes, Verbal Permission Granted (from spouse as patient has alzheimer's disease)     Permission granted to share info w AGENCY: [name]        Emotional Assessment Appearance:: Appears stated age Attitude/Demeanor/Rapport: Unable to  Assess Affect (typically observed): Unable to Assess Orientation: :  (disoriented) Alcohol / Substance Use: Not Applicable Psych Involvement: No (comment)  Admission diagnosis:  Acute cystitis with hematuria [N30.01] Sepsis (HCC) [A41.9] Altered mental status, unspecified altered mental status type [R41.82] Patient Active Problem List   Diagnosis Date Noted   Urinary retention 01/25/2023   Normocytic anemia 01/25/2023   Nondisplaced fracture of distal phalanx of right great toe, initial encounter for closed fracture 01/25/2023   Dysphagia 01/01/2023   Femur fracture, left (HCC) 12/31/2022   Closed fracture of left hip (HCC) 12/31/2022   S/p left hip fracture 12/30/2022   Alzheimer's dementia without behavioral disturbance, psychotic disturbance, mood disturbance, or anxiety (HCC) 12/26/2022   Encephalopathy 12/25/2022   Weakness 12/25/2022   UTI (urinary tract infection) 12/25/2022   Dementia with behavioral disturbance (HCC) 07/09/2021   External hemorrhoid 06/02/2021   Hallucination 06/02/2021   Anxiety 06/02/2021   Rectal discomfort 06/02/2021   Advance directive discussed with patient 01/25/2021   Need for pneumococcal vaccination 01/25/2021   Hyperlipidemia 11/30/2020   Encounter for health maintenance examination in adult 11/30/2020   Medicare annual wellness visit, subsequent 11/30/2020   Alzheimer disease (HCC) 11/30/2020   Abnormal albumin 07/07/2020   Impaired fasting blood sugar 07/07/2020   Atherosclerosis of coronary artery of native heart without angina pectoris 07/07/2020   Aortic atherosclerosis (HCC) 07/07/2020   Essential hypertension,  benign 04/29/2020   Urinary frequency 04/29/2020   Overactive bladder 04/29/2020   Benign prostatic hyperplasia with urinary frequency 04/29/2020   Screen for colon cancer 04/29/2020   Dementia without behavioral disturbance (HCC) 04/15/2020   Abdominal discomfort 04/15/2020   Frequent urination 04/15/2020   Constipation  10/15/2019   Bradycardia 07/18/2018   Onychomycosis 08/30/2016   Insomnia 08/03/2015   Generalized anxiety disorder 08/03/2015   Screening for prostate cancer 08/03/2015   Need for influenza vaccination 08/03/2015   Vaccine counseling 08/03/2015   Second hand tobacco smoke exposure 07/31/2014   Dyslipidemia 07/31/2014   PCP:  System, Provider Not In Pharmacy:   [facility] - [place], [place] - [address] [place] [address] Phone: [phone] Fax: [phone]     Social Determinants of Health (SDOH) Social History: SDOH Screenings   Depression (PHQ2-9): Low Risk  (06/02/2021)  Tobacco Use: Medium Risk (01/03/2023)   SDOH Interventions:     Readmission Risk Interventions     No data to display

## 2023-01-25 NOTE — Care Management Obs Status (Signed)
MEDICARE OBSERVATION STATUS NOTIFICATION   Patient Details  Name: Paul Klein MRN: [id number] Date of Birth: [birth year]   Medicare Observation Status Notification Given:  Yes    Tom-Johnson, Hershal Coria, RN 01/25/2023, 2:19 PM

## 2023-01-25 NOTE — ED Notes (Signed)
Pt very confused and altered; will not leave any monitoring equipment on

## 2023-01-25 NOTE — Consult Note (Signed)
WOC Nurse Consult Note: Reason for Consult: Consult requested for left leg and right great toe.  Performed remotely after review of progress notes and photos in the EMR.  Left ankle and posterior calf with red moist macerated partail thickness area of abrasion; approx 4X4X.1cm, according to bedside nurse flow sheet.  Right great toe with black necrotic nailbed and full thickness wound to anterior toe; red and moist with generalized edema and mod amt tan drainage.  Approx 4X4X.1cm according to bedside nursing wound flow sheet. There is a fracture to the right great toe, according to X-ray.  This is beyond the scope of practice for WOC nurses.  Secure chat message sent to the primary team to request an ortho consult for further plan of care.  Dressing procedure/placement/frequency: Topical treatment orders provided for bedside nurses to perform as follows: 1. Apply Bactroban to right great toe wound Q day, then cover with gauze and tape 2. Apply Xeroform gauze and ABD pad to left posterior calf and ankle Q day, then cover with kerlex. Please re-consult if further assistance is needed.  Thank-you,  Cammie Mcgee MSN, RN, CWOCN, [place], CNS [phone]

## 2023-01-25 NOTE — ED Provider Notes (Signed)
[place] EMERGENCY DEPARTMENT AT [facility] Provider Note   CSN: [id number] Arrival date & time: 01/24/23  2236     History  Chief Complaint  Patient presents with   Altered Mental Status    Paul Klein is a 67 y.o. male.   Altered Mental Status  Patient is a 67 year old male with a past medical history significant for anxiety, hemorrhoids, Alzheimer's dementia, panic attacks  Patient is presented emergency room due to confusion brought in by EMS from [facility] facility where he was living status post left hip previous provider's repair after fall.  Per [facility] patient has a history of Alzheimer's and is normally only oriented to his name.  He was ambulatory prior to the hip fracture.  It seems that patient is at his mental baseline however [facility] staff is unable to tell me why they checked a temperature on him however they did not check a temperature on him and he was found to have a fever.  I wonder if perhaps he was more combative than usual as he is somewhat disoriented and combative here in the emergency room.  CBG was 151 and he was given 800 mL of lactated Ringer's prior to arrival.  He is febrile 102.3 rectal temperature and has not received any antipyretics per [employer] and EMS.  Patient is unable to answer any questions due to his mental status level 5 caveat  Discussed with patient and wife who corroborates the patient does have severe dementia and is usually only able to give yes or no answers occasionally when he is well-oriented.       Home Medications Prior to Admission medications   Medication Sig Start Date End Date Taking? Authorizing Provider  acetaminophen (TYLENOL) 325 MG tablet Take 2 tablets (650 mg total) by mouth every 6 (six) hours as needed for moderate pain. 01/06/23 02/05/23  Morene Crocker, MD  aspirin EC 81 MG tablet Take 1 tablet (81 mg total) by mouth 2 (two) times daily. To prevent  blood clots for 30 days after surgery. 01/03/23   Jenne Pane, PA-C  atorvastatin (LIPITOR) 20 MG tablet Take 20 mg by mouth at bedtime.    [provider]  clonazePAM (KLONOPIN) 0.5 MG tablet Take 1 tablet (0.5 mg total) by mouth at bedtime. Patient taking differently: Take 0.5 mg by mouth 3 (three) times daily. 12/29/22   Modena Slater, DO      Allergies    Prozac [fluoxetine hcl]    Review of Systems   Review of Systems  Physical Exam Updated Vital Signs BP (!) 117/54   Pulse 95   Temp 99.4 F (37.4 C) (Oral)   Resp 18   Ht 6' (1.829 m)   Wt 72 kg   SpO2 98%   BMI 21.53 kg/m  Physical Exam Vitals and nursing note reviewed.  Constitutional:      General: He is not in acute distress. HENT:     Head: Normocephalic and atraumatic.     Nose: Nose normal.     Mouth/Throat:     Mouth: Mucous membranes are moist.  Eyes:     General: No scleral icterus. Cardiovascular:     Rate and Rhythm: Normal rate and regular rhythm.     Pulses: Normal pulses.     Heart sounds: Normal heart sounds.  Pulmonary:     Effort: Pulmonary effort is normal. No respiratory distress.     Breath sounds: No wheezing.  Abdominal:     Palpations: Abdomen is soft.     Tenderness: There is no abdominal tenderness. There is no guarding or rebound.  Musculoskeletal:     Cervical back: Normal range of motion.     Right lower leg: No edema.     Left lower leg: No edema.  Skin:    General: Skin is warm and dry.     Capillary Refill: Capillary refill takes less than 2 seconds.  Neurological:     Mental Status: He is alert. Mental status is at baseline.  Psychiatric:        Mood and Affect: Mood normal.        Behavior: Behavior normal.            ED Results / Procedures / Treatments   Labs (all labs ordered are listed, but only abnormal results are displayed) Labs Reviewed  LACTIC ACID, PLASMA - Abnormal; Notable for the following components:      Result Value   Lactic Acid,  Venous 3.3 (*)    All other components within normal limits  COMPREHENSIVE METABOLIC PANEL - Abnormal; Notable for the following components:   Sodium 134 (*)    Glucose, Bld 129 (*)    Calcium 8.6 (*)    Total Protein 5.3 (*)    Albumin 2.7 (*)    Total Bilirubin 1.4 (*)    All other components within normal limits  CBC WITH DIFFERENTIAL/PLATELET - Abnormal; Notable for the following components:   WBC 12.3 (*)    RBC 3.43 (*)    Hemoglobin 9.4 (*)    HCT 28.9 (*)    RDW 16.2 (*)    Neutro Abs 10.4 (*)    All other components within normal limits  APTT - Abnormal; Notable for the following components:   aPTT 21 (*)    All other components within normal limits  URINALYSIS, W/ REFLEX TO CULTURE (INFECTION SUSPECTED) - Abnormal; Notable for the following components:   Color, Urine AMBER (*)    APPearance TURBID (*)    Hgb urine dipstick MODERATE (*)    Ketones, ur 5 (*)    Protein, ur 100 (*)    Nitrite POSITIVE (*)    Leukocytes,Ua LARGE (*)    Bacteria, UA MANY (*)    All other components within normal limits  RESP PANEL BY RT-PCR (RSV, FLU A&B, COVID)  RVPGX2  URINE CULTURE  CULTURE, BLOOD (ROUTINE X 2)  CULTURE, BLOOD (ROUTINE X 2)  LACTIC ACID, PLASMA  PROTIME-INR  COMPREHENSIVE METABOLIC PANEL  CBC WITH DIFFERENTIAL/PLATELET  LACTIC ACID, PLASMA  LACTIC ACID, PLASMA    EKG EKG Interpretation  Date/Time:  Tuesday January 24 2023 22:46:09 EDT Ventricular Rate:  93 PR Interval:    QRS Duration: 127 QT Interval:  426 QTC Calculation: 530 R Axis:   99 Text Interpretation: Right and left arm electrode reversal, interpretation assumes no reversal Interpretation limited secondary to artifact Atrial fibrillation Ventricular premature complex Confirmed by Zadie Rhine ([id number]) on 01/24/2023 11:07:02 PM  Radiology CT ABDOMEN PELVIS W CONTRAST  Result Date: 01/25/2023 CLINICAL DATA:  Abdominal pain. Tenderness to palpation. Baseline dementia. EXAM: CT ABDOMEN AND PELVIS  WITH CONTRAST TECHNIQUE: Multidetector CT imaging of the abdomen and pelvis was performed using the standard protocol following bolus administration of intravenous contrast. RADIATION DOSE REDUCTION: This exam was performed according to the departmental dose-optimization program which includes automated exposure control, adjustment of the mA and/or kV according to patient size and/or use of  iterative reconstruction technique. CONTRAST:  75mL OMNIPAQUE IOHEXOL 350 MG/ML SOLN COMPARISON:  CT without contrast 08/09/2021, CT with contrast 08/08/2021 FINDINGS: Lower chest: Lung bases are clear with mild chronic elevation right diaphragm. The cardiac size is normal. There are scattered calcifications in the left main and LAD coronary artery Hepatobiliary: The liver is mildly steatotic. No mass is seen. Unremarkable gallbladder and bile ducts. Pancreas: No abnormality. Spleen: No abnormality.  No splenomegaly. Adrenals/Urinary Tract: There is no adrenal or renal cortical mass enhancement. No intrarenal stone is seen. There is symmetric mild bilateral hydroureteronephrosis extending to the bladder, which itself is moderately distended with the dome reaching as high as the L4-5 level. No focal wall thickening is evident, with partial obscuration of the left posterolateral bladder wall by spray artifact from a left hip arthroplasty. The visualized bladder wall is unremarkable. Stomach/Bowel: The stomach is contracted. The small bowel is normal caliber. The appendix is unremarkable. There is moderate retained stool in the ascending colon and in the rectum. There are scattered diverticulosis without evidence of diverticulitis. Vascular/Lymphatic: Aortic atherosclerosis. No enlarged abdominal or pelvic lymph nodes. Reproductive: Mild prostatomegaly with transverse axis 4.7 cm. Other: There are no incarcerated hernias. There is no free air, free hemorrhage or free fluid. Musculoskeletal: There is mild edema in the upper thighs.  There is a left hip arthroplasty which was placed 01/01/2023 according to Epic notes. There is a hypodense collection posterior to proximal left femoral metaphysis measuring 6.0 x 2.8 x 5.1 cm, with scattered calcifications along its periphery. This is probably a hematoma as it measures 40-50 Hounsfield units. Infectious complication not strictly excluded. There is mild osteopenia with degenerative changes spine. No regional skeletal fractures. Bridging osteophytes anterior right SI joint. No destructive bone lesions. IMPRESSION: 1. Moderate bladder distention with symmetric mild bilateral hydroureteronephrosis. No stone disease. Findings could be due to bladder outlet obstruction or neurogenic bladder. Correlate clinically for cystitis. 2. Mild prostatomegaly. 3. Constipation and diverticulosis. 4. Aortic and coronary artery atherosclerosis. 5. Left hip arthroplasty with 6.0 x 2.8 x 5.1 cm hypodense collection posterior to the proximal left femoral metaphysis, probably a hematoma. Infectious complication not strictly excluded. 6. Mild hepatic steatosis. Aortic Atherosclerosis (ICD10-I70.0). Electronically Signed   By: Almira Bar M.D.   On: 01/25/2023 03:15   DG Toe Great Right  Result Date: 01/25/2023 CLINICAL DATA:  Recent trauma with first toe pain, initial encounter EXAM: RIGHT GREAT TOE COMPARISON:  01/24/2023 FINDINGS: Fracture of the first distal phalanx is noted. Mild soft tissue swelling is seen. No other focal abnormality is noted. IMPRESSION: First distal phalangeal fracture with evidence of soft tissue swelling. Electronically Signed   By: Alcide Clever M.D.   On: 01/25/2023 02:12   DG Hip Unilat W or Wo Pelvis 2-3 Views Left  Result Date: 01/25/2023 CLINICAL DATA:  Possible sepsis EXAM: DG HIP (WITH OR WITHOUT PELVIS) 2-3V LEFT COMPARISON:  12/30/2022 FINDINGS: Left hip replacement with intact hardware and normal alignment. No acute fracture. Pubic symphysis and rami appear intact. SI joints  are non widened. Edema within the soft tissues overlying the left hip IMPRESSION: Left hip replacement. No acute osseous abnormality. Electronically Signed   By: Jasmine Pang M.D.   On: 01/25/2023 00:05   DG Foot Complete Right  Result Date: 01/25/2023 CLINICAL DATA:  Pain EXAM: RIGHT FOOT COMPLETE - 3+ VIEW COMPARISON:  None Available. FINDINGS: Limited by technique and position. No gross fracture or malalignment. Degenerative changes of the IP joints. Diffuse soft tissue swelling.  Vascular calcifications. IMPRESSION: Limited by technique and position. No gross acute osseous abnormality. Electronically Signed   By: Jasmine Pang M.D.   On: 01/25/2023 00:02   DG Chest Port 1 View  Result Date: 01/24/2023 CLINICAL DATA:  Possible sepsis EXAM: PORTABLE CHEST 1 VIEW COMPARISON:  12/30/2022 FINDINGS: No acute airspace disease or effusion. Cardiomediastinal silhouette within normal limits. Aortic atherosclerosis. IMPRESSION: No active disease. Electronically Signed   By: Jasmine Pang M.D.   On: 01/24/2023 23:59    Procedures .Critical Care  Performed by: Gailen Shelter, PA Authorized by: Gailen Shelter, PA   Critical care provider statement:    Critical care time (minutes):  35   Critical care time was exclusive of:  Separately billable procedures and treating other patients and teaching time   Critical care was necessary to treat or prevent imminent or life-threatening deterioration of the following conditions:  Sepsis   Critical care was time spent personally by me on the following activities:  Development of treatment plan with patient or surrogate, review of old charts, re-evaluation of patient's condition, pulse oximetry, ordering and review of radiographic studies, ordering and review of laboratory studies, ordering and performing treatments and interventions, obtaining history from patient or surrogate, examination of patient and evaluation of patient's response to treatment   Care  discussed with: admitting provider       Medications Ordered in ED Medications  lactated ringers infusion ( Intravenous New Bag/Given 01/24/23 2345)  lactated ringers infusion ( Intravenous New Bag/Given 01/25/23 0258)  atorvastatin (LIPITOR) tablet 20 mg (has no administration in time range)  senna-docusate (Senokot-S) tablet 2 tablet (has no administration in time range)  polyethylene glycol (MIRALAX / GLYCOLAX) packet 17 g (has no administration in time range)  cefTRIAXone (ROCEPHIN) 1 g in sodium chloride 0.9 % 100 mL IVPB (has no administration in time range)  lactated ringers bolus 1,000 mL (0 mLs Intravenous Stopped 01/25/23 0056)  ceFEPIme (MAXIPIME) 2 g in sodium chloride 0.9 % 100 mL IVPB (0 g Intravenous Stopped 01/25/23 0015)  metroNIDAZOLE (FLAGYL) IVPB 500 mg (0 mg Intravenous Stopped 01/25/23 0137)  acetaminophen (TYLENOL) suppository 650 mg (650 mg Rectal Given 01/24/23 2349)  vancomycin (VANCOREADY) IVPB 1500 mg/300 mL (0 mg Intravenous Stopped 01/25/23 0159)  iohexol (OMNIPAQUE) 350 MG/ML injection 75 mL (75 mLs Intravenous Contrast Given 01/25/23 0244)    ED Course/ Medical Decision Making/ A&P Clinical Course as of 01/25/23 0538  Tue Jan 24, 2023  2317 1L given by EMS [WF]  2322 [facility]: Hx of alzheimers -  Oriented to name only.  Ambulatory.  Checked a temp because he was more somnolent was elevated --> sent to ER.  [WF]  2326 Wife: Severe dementia.  Sometimes can give yes or no answers.  [WF]  Wed Jan 25, 2023  0321 Toe xray - fracture  [WF]    Clinical Course User Index [WF] Gailen Shelter, PA                             Medical Decision Making Amount and/or Complexity of Data Reviewed Labs: ordered. Radiology: ordered. ECG/medicine tests: ordered.  Risk OTC drugs. Prescription drug management. Decision regarding hospitalization.   This patient presents to the ED for concern of AMS, fever, this involves a number of treatment options, and is a  complaint that carries with it a high risk of complications and morbidity. A differential diagnosis was considered for the patient's  symptoms which is discussed below:   Fever, sepsis perhaps    Co morbidities: Discussed in HPI   Brief History:  Patient is a 67 year old male with a past medical history significant for anxiety, hemorrhoids, Alzheimer's dementia, panic attacks  Patient is presented emergency room due to confusion brought in by EMS from [facility] facility where he was living status post left hip previous provider's repair after fall.  Per [facility] patient has a history of Alzheimer's and is normally only oriented to his name.  He was ambulatory prior to the hip fracture.  It seems that patient is at his mental baseline however [facility] staff is unable to tell me why they checked a temperature on him however they did not check a temperature on him and he was found to have a fever.  I wonder if perhaps he was more combative than usual as he is somewhat disoriented and combative here in the emergency room.  CBG was 151 and he was given 800 mL of lactated Ringer's prior to arrival.  He is febrile 102.3 rectal temperature and has not received any antipyretics per [employer] and EMS.  Patient is unable to answer any questions due to his mental status level 5 caveat  Discussed with patient and wife who corroborates the patient does have severe dementia and is usually only able to give yes or no answers occasionally when he is well-oriented.    EMR reviewed including pt PMHx, past surgical history and past visits to ER.   See HPI for more details   Lab Tests:  I ordered and independently interpreted labs. Labs notable for  CBC with leukocytosis of 10.8 anemia 10.2 there is a left shift with neutrophil count of 9.6, lactate elevated at 3.3 seconds lactate pending.  COVID influenza RSV negative, CMP relatively unremarkable.  Coags normal,  urinalysis with many bacteria greater than 50 WBCs, positive for nitrates and leukocytes   Imaging Studies:  Abnormal findings. I personally reviewed all imaging studies. Imaging notable for  IMPRESSION:  1. Moderate bladder distention with symmetric mild bilateral  hydroureteronephrosis. No stone disease. Findings could be due to  bladder outlet obstruction or neurogenic bladder. Correlate  clinically for cystitis.  2. Mild prostatomegaly.  3. Constipation and diverticulosis.  4. Aortic and coronary artery atherosclerosis.  5. Left hip arthroplasty with 6.0 x 2.8 x 5.1 cm hypodense  collection posterior to the proximal left femoral metaphysis,  probably a hematoma. Infectious complication not strictly excluded.  6. Mild hepatic steatosis.   R great toe fx  DG chest unremarkable   Cardiac Monitoring:  The patient was maintained on a cardiac monitor.  I personally viewed and interpreted the cardiac monitored which showed an underlying rhythm of: sinus tachycardia EKG non-ischemic   Medicines ordered:  I ordered medication including thank Flagyl cefepime for broad-spectrum antibiotic coverage, crystalloid for hydration. Reevaluation of the patient after these medicines showed that the patient improved I have reviewed the patients home medicines and have made adjustments as needed   Critical Interventions:  Tx of sepsis   Consults/Attending Physician   I requested consultation with internal medicine teaching service,  and discussed lab and imaging findings as well as pertinent plan - they recommend: They will admit   Reevaluation:  After the interventions noted above I re-evaluated patient and found that they have :improved   Social Determinants of Health:      Problem List / ED Course:  UTI - sepsis. Cultures obtained (  after abx). Pt was critically ill. Much improved after fluids and abx and antipyretics.   Dispostion:  After consideration of the  diagnostic results and the patients response to treatment, I feel that the patent would benefit from admission.   Final Clinical Impression(s) / ED Diagnoses Final diagnoses:  Altered mental status, unspecified altered mental status type  Acute cystitis with hematuria    Rx / DC Orders ED Discharge Orders     None         Gailen Shelter, [place] 01/25/23 [id number]    Zadie Rhine, MD 01/25/23 [phone]

## 2023-01-25 NOTE — Progress Notes (Signed)
PHARMACY - PHYSICIAN COMMUNICATION CRITICAL VALUE ALERT - BLOOD CULTURE IDENTIFICATION (BCID)   Assessment:   Paul Klein is an 67 y.o. male who presented to [facility] on 01/24/2023 with declining functional status (PMH AZD). Patient diagnosed with a UTI with bladder outlet obstruction and mild hydronephrosis. UA with no squamous cells and pyuria, nitrite positive- should reflex to culture.   5/1: Scr 0.94, LA 1.0,  WBC 10.8  Vital Signs stable: Tm 102.6   4/30 Bcx: 4/4 GNRs, identified as E. Coli with CTX-M detected on BCID.   Name of physician (or Provider) Contacted: Morene Crocker, MD   Current antibiotics: ceftriaxone   Changes to prescribed antibiotics recommended: STOP Ceftriaxone, START meropenem 1g IV Q8H  Recommendations accepted by provider  Results for orders placed or performed during the hospital encounter of 01/24/23  Blood Culture ID Panel (Reflexed) (Collected: 01/24/2023 11:15 PM)  Result Value Ref Range   Enterococcus faecalis NOT DETECTED NOT DETECTED   Enterococcus Faecium NOT DETECTED NOT DETECTED   Listeria monocytogenes NOT DETECTED NOT DETECTED   Staphylococcus species NOT DETECTED NOT DETECTED   Staphylococcus aureus (BCID) NOT DETECTED NOT DETECTED   Staphylococcus epidermidis NOT DETECTED NOT DETECTED   Staphylococcus lugdunensis NOT DETECTED NOT DETECTED   Streptococcus species NOT DETECTED NOT DETECTED   Streptococcus agalactiae NOT DETECTED NOT DETECTED   Streptococcus pneumoniae NOT DETECTED NOT DETECTED   Streptococcus pyogenes NOT DETECTED NOT DETECTED   A.calcoaceticus-baumannii NOT DETECTED NOT DETECTED   Bacteroides fragilis NOT DETECTED NOT DETECTED   Enterobacterales DETECTED (A) NOT DETECTED   Enterobacter cloacae complex NOT DETECTED NOT DETECTED   Escherichia coli DETECTED (A) NOT DETECTED   Klebsiella aerogenes NOT DETECTED NOT DETECTED   Klebsiella oxytoca NOT DETECTED NOT DETECTED   Klebsiella pneumoniae NOT DETECTED NOT  DETECTED   Proteus species NOT DETECTED NOT DETECTED   Salmonella species NOT DETECTED NOT DETECTED   Serratia marcescens NOT DETECTED NOT DETECTED   Haemophilus influenzae NOT DETECTED NOT DETECTED   Neisseria meningitidis NOT DETECTED NOT DETECTED   Pseudomonas aeruginosa NOT DETECTED NOT DETECTED   Stenotrophomonas maltophilia NOT DETECTED NOT DETECTED   Candida albicans NOT DETECTED NOT DETECTED   Candida auris NOT DETECTED NOT DETECTED   Candida glabrata NOT DETECTED NOT DETECTED   Candida krusei NOT DETECTED NOT DETECTED   Candida parapsilosis NOT DETECTED NOT DETECTED   Candida tropicalis NOT DETECTED NOT DETECTED   Cryptococcus neoformans/gattii NOT DETECTED NOT DETECTED   CTX-M ESBL DETECTED (A) NOT DETECTED   Carbapenem resistance IMP NOT DETECTED NOT DETECTED   Carbapenem resistance KPC NOT DETECTED NOT DETECTED   Carbapenem resistance NDM NOT DETECTED NOT DETECTED   Carbapenem resist OXA 48 LIKE NOT DETECTED NOT DETECTED   Carbapenem resistance VIM NOT DETECTED NOT DETECTED    Jani Gravel, PharmD PGY-2 Infectious Diseases Resident  01/25/2023 2:03 PM

## 2023-01-25 NOTE — Progress Notes (Addendum)
Hospital day#0 Subjective:   Summary: Paul Klein is a 67 yo [name], recurrent falls status recent hospital discharge on 4.12.2024 after L hemiarthroplasty for displaced proximal femur fracture presenting from [facility] admitted to IMTS acute encephalopathy from sepsis in setting of  UTI vs worsening alzheimer's dementia  Overnight: 1 BM overnight  Interim history: Patient was examined at bedside. Patient was awake and interactive but confused and holding his abdomen.   Objective:  Vital signs in last 24 hours: Vitals:   01/25/23 0321 01/25/23 0430 01/25/23 0629 01/25/23 0802  BP:  (!) 117/54 116/69 121/65  Pulse:  95 (!) 103 98  Resp:   20 18  Temp: 99.4 F (37.4 C)  99.6 F (37.6 C) 98.8 F (37.1 C)  TempSrc: Oral  Oral Oral  SpO2:  98% 95% 97%  Weight:      Height:       Supplemental O2: Room Air SpO2: 97 % Filed Weights   01/24/23 2329  Weight: 72 kg    Physical Exam:  Constitutional:Chronically ill elderly male in acute distress HENT: Moist mucous membranes Cardiovascular: RRR Pulmonary/Chest: normal work of breathing on room air, lungs clear to auscultation bilaterally, anteriorly. Abdominal: Normal BS, soft.  Suprapubic tenderness. MSK: normal bulk and tone. No pitting edema. R first toe with erythema, dry blood and tenderness in setting of closed fracture. Post surgical scar without erythema, edema or point tenderness Neurological: Awake and confused. Patient able to communicate he was in pain when examining his abdomen and R toe. Asking for his wife. Skin: warm and dry Psych: Pleasant mood and affect    Intake/Output Summary (Last 24 hours) at 01/25/2023 1053 Last data filed at 01/25/2023 1000 Gross per 24 hour  Intake 953.33 ml  Output 700 ml  Net 253.33 ml   Net IO Since Admission: 253.33 mL [01/25/23 1053]  Bladder scan with >900 mL urine  Pertinent Labs:    Latest Ref Rng & Units 01/25/2023    5:23 AM  01/24/2023   11:15 PM 01/04/2023    2:00 AM  CBC  WBC 4.0 - 10.5 K/uL 10.8  12.3  10.7   Hemoglobin 13.0 - 17.0 g/dL 16.1  9.4  09.6   Hematocrit 39.0 - 52.0 % 32.0  28.9  39.9   Platelets 150 - 400 K/uL 251  269  388        Latest Ref Rng & Units 01/25/2023    5:23 AM 01/24/2023   11:15 PM 01/01/2023    3:41 AM  CMP  Glucose 70 - 99 mg/dL 045  409  811   BUN 8 - 23 mg/dL 9  8  23    Creatinine 0.61 - 1.24 mg/dL 9.14  7.82  9.56   Sodium 135 - 145 mmol/L 136  134  141   Potassium 3.5 - 5.1 mmol/L 3.5  3.5  3.9   Chloride 98 - 111 mmol/L 100  100  104   CO2 22 - 32 mmol/L 23  24  27    Calcium 8.9 - 10.3 mg/dL 8.7  8.6  9.3   Total Protein 6.5 - 8.1 g/dL 5.5  5.3    Total Bilirubin 0.3 - 1.2 mg/dL 1.1  1.4    Alkaline Phos 38 - 126 U/L 112  113    AST 15 - 41 U/L 20  27    ALT 0 - 44 U/L 10  12       Assessment/Plan:  Principal Problem:   UTI (urinary tract infection) Active Problems:   Constipation   Dementia with behavioral disturbance (HCC)   S/p left hip fracture   Urinary retention   Normocytic anemia   Nondisplaced fracture of distal phalanx of right great toe, initial encounter for closed fracture   ESBL E Coli Bacteremia from UTI Patient with fever, tachycardia, tachypnea, elevated lactic acid and encephalopathy on admission. Thought to be secondary to UTI. CT A/b with bladder distention and bilateral hydroureteronephrosis. Lactic acid now resolved s/p 1L IVF bolus and 146mL/HR maintenance fluids. Unclear in acute encephalopathy in the setting of infection, worsening dementia, or from pain, constipation, or urinary retention. Overall, improving on narrowed spectrum antibiotics and fluid resuscitation. 700 mL output on condom catheter. Given suprapubic tenderness on exam, followed with bladder scan which demonstrated >900 mL of urine. Foley inserted and left overnight. Will continue monitor for improvement -Meropenem -Maintenance IVF 119mL/HR for 10 hours -Foley in  place -Constipation management as below -Delirium precautions -Mittens in place -Consider Flomax when BP allows  S/p Hemiarthroplasty 01/01/2023 Post surgical hematoma vs seroma on proximal L femoral metaphysis  Hgb 9.4 on admission from 12.8. Repeat Hgb this AM at 10.2. Will defer pursing IR drainage of the posterior L femur fluid collection no acute tenderness on exam and likely will not change current management. Will continue to monitor for changes on physical exam during this admission.   First distal non displaced great toe phalangeal fracture Soft tissue edema Unknown mechanism.  Will speak with [facility] memory unit today given that this is second occult injury of this patient while under the care. Because of his limited mobility, will continue manage with supportive measures including bandaging dorsal laceration and pain management -Acetaminophen 1000 mg TID  -Oxycodone 5 mg for breakthrough pain -Bowel regimen as below  -Ice PRN -Wound coverage  Constipation Moderate stool burden on CT Patient with recent surgery with limited mobility, now s/p 1 BM.  -Manual disimpaction -Senna S 2 tablets once this AM -Miralax daily -Senna S 2 tablets nightly  Alzheimer's dementia GOC conversation addressed during recent past two hospitalizations patient to not pursue procedures unless to help with pain or comfort. Will contact family today for updates. -Delirium precautions  Diet: Normal VTE: SCDs Code: DNR PT/OT recs: Memory care unit Family Update: Will    Dispo: Anticipated discharge to  memory care unit  in 1- 2 days pending clinical improvement.   Morene Crocker, MD Internal Medicine Resident PGY-1 Please contact the on call pager after 5 pm and on weekends at [phone].

## 2023-01-25 NOTE — Plan of Care (Signed)

## 2023-01-25 NOTE — Progress Notes (Signed)
Attempted to get patient OOB to chair via 2 assist. Unable to get pt. Up, pt is not bearing weight on bil. Lower extremities  Paul Klein

## 2023-01-25 NOTE — ED Notes (Signed)
ED TO INPATIENT HANDOFF REPORT  ED Nurse Name and Phone #: Minerva Areola [phone]  S Name/Age/Gender Paul Klein 67 y.o. male Room/Bed: 027C/027C  Code Status   Code Status: DNR  Home/SNF/Other Nursing Home Patient oriented to: self Is this baseline? Yes   Triage Complete: Triage complete  Chief Complaint Sepsis [facility]) [A41.9]  Triage Note Patient arrived with EMS from [facility] staff reported increasing confusion/disoriented with agitation onset this morning with fever ( T= 102.3) , CBG= 151, history of UTIs , received approx. 800 ml LR prior to arrival . History of Alzheimer's dementia .    Allergies Allergies  Allergen Reactions   Prozac [Fluoxetine Hcl]     SSRIs in general cause worsened depression and suicidal ideation    Level of Care/Admitting Diagnosis ED Disposition     ED Disposition  Admit   Condition  --   Comment  Hospital Area: [facility] [[id number]]  Level of Care: Med-Surg [16]  May place patient in observation at [facility] or [facility] if equivalent level of care is available:: Yes  Covid Evaluation: Confirmed COVID Negative  Diagnosis: Sepsis [facility]) [[id number]]  Admitting Physician: Tyson Alias [[id number]]  Attending Physician: Tyson Alias [[id number]]          B Medical/Surgery History Past Medical History:  Diagnosis Date   Alzheimer's disease (HCC)    Anxiety    Dr. Evelene Croon   Diverticulosis of colon    sigmoid, per colonoscopy 2014, Dr. Elnoria Howard   Dyslipidemia    Hemorrhoids    internal and externa per 2014 colonoscopy   Insomnia    uses Neurontin ( Dr. Evelene Croon)   Normal cardiac stress test 12/2012   performed due to risk factors, abnormal EKG; Dr. Donnie Aho   Panic attack    Second hand smoke exposure    wife   Past Surgical History:  Procedure Laterality Date   ANAL FISSURE REPAIR     COLONOSCOPY  02/19/13   diverticulosis of sigmoid colon, external and internal hemorrhoids, Dr. Elnoria Howard, repeat in 10  years   HIP ARTHROPLASTY Left 01/01/2023   Procedure: ARTHROPLASTY BIPOLAR HIP (HEMIARTHROPLASTY);  Surgeon: Sheral Apley, MD;  Location: [facility] OR;  Service: Orthopedics;  Laterality: Left;   SKIN BIOPSY     face, back   WISDOM TOOTH EXTRACTION       A IV Location/Drains/Wounds Patient Lines/Drains/Airways Status     Active Line/Drains/Airways     Name Placement date Placement time Site Days   Peripheral IV 01/24/23 20 G Left Wrist 01/24/23  --  Wrist  1            Intake/Output Last 24 hours No intake or output data in the 24 hours ending 01/25/23 0452  Labs/Imaging Results for orders placed or performed during the hospital encounter of 01/24/23 (from the past 48 hour(s))  Lactic acid, plasma     Status: None   Collection Time: 01/24/23 11:15 PM  Result Value Ref Range   Lactic Acid, Venous 1.9 0.5 - 1.9 mmol/L    Comment: Performed at [facility], [address] [place], [place] [address]  Comprehensive metabolic panel     Status: Abnormal   Collection Time: 01/24/23 11:15 PM  Result Value Ref Range   Sodium 134 (L) 135 - 145 mmol/L   Potassium 3.5 3.5 - 5.1 mmol/L   Chloride 100 98 - 111 mmol/L   CO2 24 22 - 32 mmol/L   Glucose, Bld  129 (H) 70 - 99 mg/dL    Comment: Glucose reference range applies only to samples taken after fasting for at least 8 hours.   BUN 8 8 - 23 mg/dL   Creatinine, Ser 4.09 0.61 - 1.24 mg/dL   Calcium 8.6 (L) 8.9 - 10.3 mg/dL   Total Protein 5.3 (L) 6.5 - 8.1 g/dL   Albumin 2.7 (L) 3.5 - 5.0 g/dL   AST 27 15 - 41 U/L   ALT 12 0 - 44 U/L   Alkaline Phosphatase 113 38 - 126 U/L   Total Bilirubin 1.4 (H) 0.3 - 1.2 mg/dL   GFR, Estimated >81 >19 mL/min    Comment: (NOTE) Calculated using the CKD-EPI Creatinine Equation (2021)    Anion gap 10 5 - 15    Comment: Performed at [facility], [address], [place], [place] [address]  CBC with Differential     Status: Abnormal   Collection Time: 01/24/23 11:15 PM  Result Value  Ref Range   WBC 12.3 (H) 4.0 - 10.5 K/uL   RBC 3.43 (L) 4.22 - 5.81 MIL/uL   Hemoglobin 9.4 (L) 13.0 - 17.0 g/dL   HCT 95.6 (L) 21.3 - 08.6 %   MCV 84.3 80.0 - 100.0 fL   MCH 27.4 26.0 - 34.0 pg   MCHC 32.5 30.0 - 36.0 g/dL   RDW 57.8 (H) 46.9 - 62.9 %   Platelets 269 150 - 400 K/uL    Comment: REPEATED TO VERIFY   nRBC 0.0 0.0 - 0.2 %   Neutrophils Relative % 86 %   Neutro Abs 10.4 (H) 1.7 - 7.7 K/uL   Lymphocytes Relative 6 %   Lymphs Abs 0.8 0.7 - 4.0 K/uL   Monocytes Relative 8 %   Monocytes Absolute 1.0 0.1 - 1.0 K/uL   Eosinophils Relative 0 %   Eosinophils Absolute 0.0 0.0 - 0.5 K/uL   Basophils Relative 0 %   Basophils Absolute 0.0 0.0 - 0.1 K/uL   Immature Granulocytes 0 %   Abs Immature Granulocytes 0.05 0.00 - 0.07 K/uL    Comment: Performed at [facility], [address], [place], [place] [address]  Protime-INR     Status: None   Collection Time: 01/24/23 11:15 PM  Result Value Ref Range   Prothrombin Time 14.9 11.4 - 15.2 seconds   INR 1.2 0.8 - 1.2    Comment: (NOTE) INR goal varies based on device and disease states. Performed at [facility], [address], [place], [place] [address]   APTT     Status: Abnormal   Collection Time: 01/24/23 11:15 PM  Result Value Ref Range   aPTT 21 (L) 24 - 36 seconds    Comment: Performed at [facility], [address], [place], [place] [address]  Resp panel by RT-PCR (RSV, Flu A&B, Covid) Anterior Nasal Swab     Status: None   Collection Time: 01/24/23 11:30 PM   Specimen: Anterior Nasal Swab  Result Value Ref Range   SARS Coronavirus 2 by RT PCR NEGATIVE NEGATIVE   Influenza A by PCR NEGATIVE NEGATIVE   Influenza B by PCR NEGATIVE NEGATIVE    Comment: (NOTE) The Xpert Xpress SARS-CoV-2/FLU/RSV plus assay is intended as an aid in the diagnosis of influenza from Nasopharyngeal swab specimens and should not be used as a sole basis for treatment. Nasal washings and aspirates are unacceptable for  Xpert Xpress SARS-CoV-2/FLU/RSV testing.  Fact Sheet for Patients:  BloggerCourse.com  Fact Sheet for Healthcare Providers: SeriousBroker.it  This test is not yet approved or cleared by the [place] FDA and has been authorized for detection and/or diagnosis of SARS-CoV-2 by FDA under an Emergency Use Authorization (EUA). This EUA will remain in effect (meaning this test can be used) for the duration of the COVID-19 declaration under Section 564(b)(1) of the Act, 21 U.S.C. section 360bbb-3(b)(1), unless the authorization is terminated or revoked.     Resp Syncytial Virus by PCR NEGATIVE NEGATIVE    Comment: (NOTE) Fact Sheet for Patients: BloggerCourse.com  Fact Sheet for Healthcare Providers: SeriousBroker.it  This test is not yet approved or cleared by the [place] FDA and has been authorized for detection and/or diagnosis of SARS-CoV-2 by FDA under an Emergency Use Authorization (EUA). This EUA will remain in effect (meaning this test can be used) for the duration of the COVID-19 declaration under Section 564(b)(1) of the Act, 21 U.S.C. section 360bbb-3(b)(1), unless the authorization is terminated or revoked.  Performed at [facility], [address] [place], [place] [address]   Lactic acid, plasma     Status: Abnormal   Collection Time: 01/25/23  1:19 AM  Result Value Ref Range   Lactic Acid, Venous 3.3 (HH) 0.5 - 1.9 mmol/L    Comment: CRITICAL RESULT CALLED TO, READ BACK BY AND VERIFIED WITH E. Mickenzie Stolar RN 01/25/23 @0205  BY J. WHITE Performed at [facility], [address] [place], [place] [address]   Urinalysis, w/ Reflex to Culture (Infection Suspected) -Urine, Clean Catch     Status: Abnormal   Collection Time: 01/25/23  3:05 AM  Result Value Ref Range   Specimen Source URINE, CLEAN CATCH    Color, Urine AMBER (A) YELLOW    Comment:  BIOCHEMICALS MAY BE AFFECTED BY COLOR   APPearance TURBID (A) CLEAR   Specific Gravity, Urine 1.010 1.005 - 1.030   pH 5.0 5.0 - 8.0   Glucose, UA NEGATIVE NEGATIVE mg/dL   Hgb urine dipstick MODERATE (A) NEGATIVE   Bilirubin Urine NEGATIVE NEGATIVE   Ketones, ur 5 (A) NEGATIVE mg/dL   Protein, ur 098 (A) NEGATIVE mg/dL   Nitrite POSITIVE (A) NEGATIVE   Leukocytes,Ua LARGE (A) NEGATIVE   RBC / HPF 11-20 0 - 5 RBC/hpf   WBC, UA >50 0 - 5 WBC/hpf    Comment:        Reflex urine culture not performed if WBC <=10, OR if Squamous epithelial cells >5. If Squamous epithelial cells >5 suggest recollection.    Bacteria, UA MANY (A) NONE SEEN   Squamous Epithelial / HPF 0-5 0 - 5 /HPF   Mucus PRESENT     Comment: Performed at [facility], [address], [place], [place] [address]  Urine Culture     Status: None (Preliminary result)   Collection Time: 01/25/23  3:05 AM   Specimen: Urine, Catheterized  Result Value Ref Range   Specimen Description URINE, CATHETERIZED    Special Requests      NONE Reflexed from [phone] Performed at [facility], [address] [place], [place] [address]    Culture PENDING    Report Status PENDING    CT ABDOMEN PELVIS W CONTRAST  Result Date: 01/25/2023 CLINICAL DATA:  Abdominal pain. Tenderness to palpation. Baseline dementia. EXAM: CT ABDOMEN AND PELVIS WITH CONTRAST TECHNIQUE: Multidetector CT imaging of the abdomen and pelvis was performed using the standard protocol following bolus administration of intravenous contrast.  RADIATION DOSE REDUCTION: This exam was performed according to the departmental dose-optimization program which includes automated exposure control, adjustment of the mA and/or kV according to patient size and/or use of iterative reconstruction technique. CONTRAST:  75mL OMNIPAQUE IOHEXOL 350 MG/ML SOLN COMPARISON:  CT without contrast 08/09/2021, CT with contrast 08/08/2021 FINDINGS: Lower chest: Lung bases are clear with  mild chronic elevation right diaphragm. The cardiac size is normal. There are scattered calcifications in the left main and LAD coronary artery Hepatobiliary: The liver is mildly steatotic. No mass is seen. Unremarkable gallbladder and bile ducts. Pancreas: No abnormality. Spleen: No abnormality.  No splenomegaly. Adrenals/Urinary Tract: There is no adrenal or renal cortical mass enhancement. No intrarenal stone is seen. There is symmetric mild bilateral hydroureteronephrosis extending to the bladder, which itself is moderately distended with the dome reaching as high as the L4-5 level. No focal wall thickening is evident, with partial obscuration of the left posterolateral bladder wall by spray artifact from a left hip arthroplasty. The visualized bladder wall is unremarkable. Stomach/Bowel: The stomach is contracted. The small bowel is normal caliber. The appendix is unremarkable. There is moderate retained stool in the ascending colon and in the rectum. There are scattered diverticulosis without evidence of diverticulitis. Vascular/Lymphatic: Aortic atherosclerosis. No enlarged abdominal or pelvic lymph nodes. Reproductive: Mild prostatomegaly with transverse axis 4.7 cm. Other: There are no incarcerated hernias. There is no free air, free hemorrhage or free fluid. Musculoskeletal: There is mild edema in the upper thighs. There is a left hip arthroplasty which was placed 01/01/2023 according to Epic notes. There is a hypodense collection posterior to proximal left femoral metaphysis measuring 6.0 x 2.8 x 5.1 cm, with scattered calcifications along its periphery. This is probably a hematoma as it measures 40-50 Hounsfield units. Infectious complication not strictly excluded. There is mild osteopenia with degenerative changes spine. No regional skeletal fractures. Bridging osteophytes anterior right SI joint. No destructive bone lesions. IMPRESSION: 1. Moderate bladder distention with symmetric mild bilateral  hydroureteronephrosis. No stone disease. Findings could be due to bladder outlet obstruction or neurogenic bladder. Correlate clinically for cystitis. 2. Mild prostatomegaly. 3. Constipation and diverticulosis. 4. Aortic and coronary artery atherosclerosis. 5. Left hip arthroplasty with 6.0 x 2.8 x 5.1 cm hypodense collection posterior to the proximal left femoral metaphysis, probably a hematoma. Infectious complication not strictly excluded. 6. Mild hepatic steatosis. Aortic Atherosclerosis (ICD10-I70.0). Electronically Signed   By: Almira Bar M.D.   On: 01/25/2023 03:15   DG Toe Great Right  Result Date: 01/25/2023 CLINICAL DATA:  Recent trauma with first toe pain, initial encounter EXAM: RIGHT GREAT TOE COMPARISON:  01/24/2023 FINDINGS: Fracture of the first distal phalanx is noted. Mild soft tissue swelling is seen. No other focal abnormality is noted. IMPRESSION: First distal phalangeal fracture with evidence of soft tissue swelling. Electronically Signed   By: Alcide Clever M.D.   On: 01/25/2023 02:12   DG Hip Unilat W or Wo Pelvis 2-3 Views Left  Result Date: 01/25/2023 CLINICAL DATA:  Possible sepsis EXAM: DG HIP (WITH OR WITHOUT PELVIS) 2-3V LEFT COMPARISON:  12/30/2022 FINDINGS: Left hip replacement with intact hardware and normal alignment. No acute fracture. Pubic symphysis and rami appear intact. SI joints are non widened. Edema within the soft tissues overlying the left hip IMPRESSION: Left hip replacement. No acute osseous abnormality. Electronically Signed   By: Jasmine Pang M.D.   On: 01/25/2023 00:05   DG Foot Complete Right  Result Date: 01/25/2023 CLINICAL DATA:  Pain EXAM:  RIGHT FOOT COMPLETE - 3+ VIEW COMPARISON:  None Available. FINDINGS: Limited by technique and position. No gross fracture or malalignment. Degenerative changes of the IP joints. Diffuse soft tissue swelling. Vascular calcifications. IMPRESSION: Limited by technique and position. No gross acute osseous abnormality.  Electronically Signed   By: Jasmine Pang M.D.   On: 01/25/2023 00:02   DG Chest Port 1 View  Result Date: 01/24/2023 CLINICAL DATA:  Possible sepsis EXAM: PORTABLE CHEST 1 VIEW COMPARISON:  12/30/2022 FINDINGS: No acute airspace disease or effusion. Cardiomediastinal silhouette within normal limits. Aortic atherosclerosis. IMPRESSION: No active disease. Electronically Signed   By: Jasmine Pang M.D.   On: 01/24/2023 23:59    Pending Labs Unresulted Labs (From admission, onward)     Start     Ordered   01/25/23 0500  Comprehensive metabolic panel  Tomorrow morning,   R        01/25/23 0432   01/25/23 0500  CBC with Differential/Platelet  Tomorrow morning,   R        01/25/23 0432   01/25/23 0443  Lactic acid, plasma  STAT Now then every 3 hours,   R (with STAT occurrences)      01/25/23 0442   01/24/23 2315  Blood Culture (routine x 2)  (Septic presentation on arrival (screening labs, nursing and treatment orders for obvious sepsis))  BLOOD CULTURE X 2,   STAT      01/24/23 2316            Vitals/Pain Today's Vitals   01/25/23 0200 01/25/23 0300 01/25/23 0321 01/25/23 0430  BP: 107/71 (!) 88/73  (!) 117/54  Pulse: 96 95  95  Resp: 19 18    Temp:   99.4 F (37.4 C)   TempSrc:   Oral   SpO2: 100% 98%  98%  Weight:      Height:      PainSc:        Isolation Precautions No active isolations  Medications Medications  lactated ringers infusion ( Intravenous New Bag/Given 01/24/23 2345)  lactated ringers infusion ( Intravenous New Bag/Given 01/25/23 0258)  atorvastatin (LIPITOR) tablet 20 mg (has no administration in time range)  senna-docusate (Senokot-S) tablet 2 tablet (has no administration in time range)  polyethylene glycol (MIRALAX / GLYCOLAX) packet 17 g (has no administration in time range)  cefTRIAXone (ROCEPHIN) 1 g in sodium chloride 0.9 % 100 mL IVPB (has no administration in time range)  lactated ringers bolus 1,000 mL (0 mLs Intravenous Stopped 01/25/23 0056)   ceFEPIme (MAXIPIME) 2 g in sodium chloride 0.9 % 100 mL IVPB (0 g Intravenous Stopped 01/25/23 0015)  metroNIDAZOLE (FLAGYL) IVPB 500 mg (0 mg Intravenous Stopped 01/25/23 0137)  acetaminophen (TYLENOL) suppository 650 mg (650 mg Rectal Given 01/24/23 2349)  vancomycin (VANCOREADY) IVPB 1500 mg/300 mL (0 mg Intravenous Stopped 01/25/23 0159)  iohexol (OMNIPAQUE) 350 MG/ML injection 75 mL (75 mLs Intravenous Contrast Given 01/25/23 0244)    Mobility non-ambulatory     Focused Assessments Cardiac Assessment Handoff:    No results found for: "CKTOTAL", "CKMB", "CKMBINDEX", "TROPONINI" No results found for: "DDIMER" Does the Patient currently have chest pain? No    R Recommendations: See Admitting Provider Note  Report given to:   Additional Notes: completely confused but will cooperate when needed

## 2023-01-25 NOTE — Progress Notes (Signed)
New Admission Note:   Arrival Method: stretcher Mental Orientation: alert to self Telemetry: none Assessment: Completed Skin: see flowsheet IV: left wrist Pain:  none Tubes: none Safety Measures: Safety Fall Prevention Plan has been discussed Admission: Completed [facility] Orientation: Patient has been orientated to the room, unit and staff.  Family: none at bedside  Orders have been reviewed and implemented. Will continue to monitor the patient. Call light has been placed within reach and bed alarm has been activated.   Artemio Aly BSN, RN Phone number: [phone]

## 2023-01-25 NOTE — Hospital Course (Addendum)
Paul Klein is a 67 yo person living at memory care for Alzheimer's dementia who was admitted to the IMTS at [facility] for acute encephalopathy, treated for sepsis secondary to  ESBL bacteremia, and transitioned to hospice care during this hospitalization who is being discharged today  Transition to hospice care Alzheimer's dementia Physical deconditioning Patient is only oriented to self and limited physical mobility at baseline. After initial treatment of his ESBL E coli bacteremia patient was able to return to this baseline, and he remained pleasantly confused. Palliative care was consulted during this admission given patient's functional decline.an repeated hospital admissions in the past 3 months. After discussion of [name] goals of care with his family, IM and palliative care team, decision was made to discharge patient to [facility] memory unit with [facility] to manage hospice care.   Sepsis ESBL E coli bacteremia from UTI Bladder stretch injury Patient presented from [facility] with acute encephalopathy, tachycardic, and hypotensive admitted for sepsis.on 5/1.Patient's bladder ultrasound  revealed severely distended bladder on admission, with >1L of urine output after foley insertion.  U/A and blood cultures grew ESBL E coli within 24 hours of admission and patient was started on meropenem. Patient level of alertness and interaction improved while on therapy. Patient 's infection was treated with Meropenem for a total of *** days of therapy. Patient 's to be discharged on foley catheter for bladder stretch injury until 5/14***.  First distal non displaced great toe phalangeal fracture Soft tissue edema Unknown mechanism per [facility] staff. This was treated conservatively during this admission as patient's physical mobility was not limited by this injury. Continue wound care at discharge.   S/p Hemiarthroplasty 01/01/2023 Post surgical hematoma vs  seroma on proximal L femoral metaphysis  Patient was recently discharged from IMTS service after a hemiarthroplasty on 4/7. Patient's surgical site was scanned during admission for sepsis and surgical site infection was suspected. Imaging revealed a small fluid collection on proximal posterior L femoral metaphysis likely in the setting of a hematoma vs seroma. IT drainage was deferred as there was no acute tenderness and alternate infection site was identified. Patient's hemoglobin was monitored and stable during this admission.  No point tenderness or signs of surgical site infection was noted otherwise***

## 2023-01-26 DIAGNOSIS — E785 Hyperlipidemia, unspecified: Secondary | ICD-10-CM | POA: Diagnosis present

## 2023-01-26 DIAGNOSIS — A4151 Sepsis due to Escherichia coli [E. coli]: Secondary | ICD-10-CM | POA: Diagnosis present

## 2023-01-26 DIAGNOSIS — F03918 Unspecified dementia, unspecified severity, with other behavioral disturbance: Secondary | ICD-10-CM | POA: Diagnosis not present

## 2023-01-26 DIAGNOSIS — Z8249 Family history of ischemic heart disease and other diseases of the circulatory system: Secondary | ICD-10-CM | POA: Diagnosis not present

## 2023-01-26 DIAGNOSIS — W230XXA Caught, crushed, jammed, or pinched between moving objects, initial encounter: Secondary | ICD-10-CM | POA: Diagnosis present

## 2023-01-26 DIAGNOSIS — R339 Retention of urine, unspecified: Secondary | ICD-10-CM | POA: Diagnosis not present

## 2023-01-26 DIAGNOSIS — E876 Hypokalemia: Secondary | ICD-10-CM | POA: Diagnosis present

## 2023-01-26 DIAGNOSIS — G4700 Insomnia, unspecified: Secondary | ICD-10-CM | POA: Diagnosis present

## 2023-01-26 DIAGNOSIS — S97111A Crushing injury of right great toe, initial encounter: Secondary | ICD-10-CM | POA: Diagnosis present

## 2023-01-26 DIAGNOSIS — Z96642 Presence of left artificial hip joint: Secondary | ICD-10-CM | POA: Diagnosis present

## 2023-01-26 DIAGNOSIS — N3289 Other specified disorders of bladder: Secondary | ICD-10-CM | POA: Diagnosis present

## 2023-01-26 DIAGNOSIS — S3729XA Other injury of bladder, initial encounter: Secondary | ICD-10-CM | POA: Diagnosis not present

## 2023-01-26 DIAGNOSIS — R4182 Altered mental status, unspecified: Secondary | ICD-10-CM | POA: Diagnosis not present

## 2023-01-26 DIAGNOSIS — S92424A Nondisplaced fracture of distal phalanx of right great toe, initial encounter for closed fracture: Secondary | ICD-10-CM | POA: Diagnosis present

## 2023-01-26 DIAGNOSIS — F02C18 Dementia in other diseases classified elsewhere, severe, with other behavioral disturbance: Secondary | ICD-10-CM | POA: Diagnosis present

## 2023-01-26 DIAGNOSIS — B9620 Unspecified Escherichia coli [E. coli] as the cause of diseases classified elsewhere: Secondary | ICD-10-CM | POA: Diagnosis not present

## 2023-01-26 DIAGNOSIS — N320 Bladder-neck obstruction: Secondary | ICD-10-CM | POA: Diagnosis present

## 2023-01-26 DIAGNOSIS — R7881 Bacteremia: Secondary | ICD-10-CM | POA: Diagnosis not present

## 2023-01-26 DIAGNOSIS — K5900 Constipation, unspecified: Secondary | ICD-10-CM | POA: Diagnosis present

## 2023-01-26 DIAGNOSIS — R6520 Severe sepsis without septic shock: Secondary | ICD-10-CM | POA: Diagnosis present

## 2023-01-26 DIAGNOSIS — G9341 Metabolic encephalopathy: Secondary | ICD-10-CM | POA: Diagnosis present

## 2023-01-26 DIAGNOSIS — Z66 Do not resuscitate: Secondary | ICD-10-CM | POA: Diagnosis present

## 2023-01-26 DIAGNOSIS — Z7189 Other specified counseling: Secondary | ICD-10-CM | POA: Diagnosis not present

## 2023-01-26 DIAGNOSIS — Z7982 Long term (current) use of aspirin: Secondary | ICD-10-CM | POA: Diagnosis not present

## 2023-01-26 DIAGNOSIS — D649 Anemia, unspecified: Secondary | ICD-10-CM | POA: Diagnosis present

## 2023-01-26 DIAGNOSIS — Z823 Family history of stroke: Secondary | ICD-10-CM | POA: Diagnosis not present

## 2023-01-26 DIAGNOSIS — Z1152 Encounter for screening for COVID-19: Secondary | ICD-10-CM | POA: Diagnosis not present

## 2023-01-26 DIAGNOSIS — F02C4 Dementia in other diseases classified elsewhere, severe, with anxiety: Secondary | ICD-10-CM | POA: Diagnosis present

## 2023-01-26 DIAGNOSIS — N136 Pyonephrosis: Secondary | ICD-10-CM | POA: Diagnosis present

## 2023-01-26 DIAGNOSIS — Z1612 Extended spectrum beta lactamase (ESBL) resistance: Secondary | ICD-10-CM | POA: Diagnosis present

## 2023-01-26 DIAGNOSIS — N390 Urinary tract infection, site not specified: Secondary | ICD-10-CM | POA: Diagnosis not present

## 2023-01-26 DIAGNOSIS — Z515 Encounter for palliative care: Secondary | ICD-10-CM | POA: Diagnosis not present

## 2023-01-26 DIAGNOSIS — G309 Alzheimer's disease, unspecified: Secondary | ICD-10-CM | POA: Diagnosis present

## 2023-01-26 DIAGNOSIS — N3001 Acute cystitis with hematuria: Secondary | ICD-10-CM | POA: Diagnosis present

## 2023-01-26 LAB — URINE CULTURE

## 2023-01-26 LAB — BASIC METABOLIC PANEL
Anion gap: 7 (ref 5–15)
BUN: 7 mg/dL — ABNORMAL LOW (ref 8–23)
CO2: 26 mmol/L (ref 22–32)
Calcium: 7.8 mg/dL — ABNORMAL LOW (ref 8.9–10.3)
Chloride: 105 mmol/L (ref 98–111)
Creatinine, Ser: 0.92 mg/dL (ref 0.61–1.24)
GFR, Estimated: >60 mL/min (ref >60)
Glucose, Bld: 147 mg/dL — ABNORMAL HIGH (ref 70–99)
Potassium: 2.8 mmol/L — ABNORMAL LOW (ref 3.5–5.1)
Sodium: 138 mmol/L (ref 135–145)

## 2023-01-26 LAB — CBC
HCT: 27.1 % — ABNORMAL LOW (ref 39.0–52.0)
Hemoglobin: 8.8 g/dL — ABNORMAL LOW (ref 13.0–17.0)
MCH: 27.2 pg (ref 26.0–34.0)
MCHC: 32.5 g/dL (ref 30.0–36.0)
MCV: 83.9 fL (ref 80.0–100.0)
Platelets: 159 10*3/uL (ref 150–400)
RBC: 3.23 MIL/uL — ABNORMAL LOW (ref 4.22–5.81)
RDW: 16.6 % — ABNORMAL HIGH (ref 11.5–15.5)
WBC: 9.1 10*3/uL (ref 4.0–10.5)
nRBC: 0 % (ref 0.0–0.2)

## 2023-01-26 LAB — CULTURE, BLOOD (ROUTINE X 2)

## 2023-01-26 LAB — GLUCOSE, CAPILLARY
Glucose-Capillary: 124 mg/dL — ABNORMAL HIGH (ref 70–99)
Glucose-Capillary: 138 mg/dL — ABNORMAL HIGH (ref 70–99)
Glucose-Capillary: 156 mg/dL — ABNORMAL HIGH (ref 70–99)

## 2023-01-26 MED ORDER — POTASSIUM CHLORIDE 10 MEQ/100ML IV SOLN
10.0000 meq | INTRAVENOUS | Status: AC
  Administered 2023-01-26 (×4): 10 meq via INTRAVENOUS
  Filled 2023-01-26 (×4): qty 100

## 2023-01-26 MED ORDER — CHLORHEXIDINE GLUCONATE CLOTH 2 % EX PADS
6.0000 | MEDICATED_PAD | Freq: Every day | CUTANEOUS | Status: DC
  Administered 2023-01-26 – 2023-02-11 (×17): 6 via TOPICAL

## 2023-01-26 MED ORDER — POTASSIUM CHLORIDE CRYS ER 20 MEQ PO TBCR
40.0000 meq | EXTENDED_RELEASE_TABLET | Freq: Once | ORAL | Status: AC
  Administered 2023-01-26: 40 meq via ORAL
  Filled 2023-01-26: qty 2

## 2023-01-26 MED ORDER — LACTATED RINGERS IV SOLN: INTRAVENOUS | Status: AC

## 2023-01-26 NOTE — Progress Notes (Signed)
Hospital day#0 Subjective:   Summary: Paul Klein is a 67 yo [name], recurrent falls status recent hospital discharge on 4.12.2024 after L hemiarthroplasty for displaced proximal femur fracture presenting from [facility] admitted to IMTS for acute encephalopathy now being managed for ESBL bacteremia from UTI  Interim history: Patient examined at beside. Patient alerted [name] he had soiled himself. Denied pain which patient let us know about yesterday.   Objective:  Vital signs in last 24 hours: Vitals:   01/25/23 0802 01/25/23 1552 01/25/23 2245 01/26/23 0431  BP: 121/65 (!) 105/56 (!) 111/49 (!) 125/49  Pulse: 98  83 77  Resp: 18 18 18 18   Temp: 98.8 F (37.1 C) 99.6 F (37.6 C) 98.8 F (37.1 C) (!) 100.5 F (38.1 C)  TempSrc: Oral Oral  Oral  SpO2: 97% 98%  99%  Weight:      Height:       Supplemental O2: Room Air SpO2: 99 % Filed Weights   01/24/23 2329  Weight: 72 kg    Physical Exam:  Constitutional: chronically ill elderly man resting in bed in NAD HENT: Moist MM Cardiovascular: RRR Pulmonary/Chest: normal work of breathing Abdominal: Normal bowel sounds, soft, non tender, non distended. MSK: Surgical site without point tenderness, erythema, or fluctuance R first foot phalanx covered with bandage  Neurological: Alert. Not oriented, intermittently interactive Skin: Warm    Intake/Output Summary (Last 24 hours) at 01/26/2023 0601 Last data filed at 01/26/2023 0511 Gross per 24 hour  Intake 2116.6 ml  Output 2900 ml  Net -783.4 ml   Net IO Since Admission: -783.4 mL [01/26/23 0601]   Pertinent Labs:    Latest Ref Rng & Units 01/26/2023    4:14 AM 01/25/2023    5:23 AM 01/24/2023   11:15 PM  CBC  WBC 4.0 - 10.5 K/uL 9.1  10.8  12.3   Hemoglobin 13.0 - 17.0 g/dL 8.8  16.1  9.4   Hematocrit 39.0 - 52.0 % 27.1  32.0  28.9   Platelets 150 - 400 K/uL 159  251  269        Latest Ref Rng & Units 01/26/2023    4:14 AM 01/25/2023     5:23 AM 01/24/2023   11:15 PM  CMP  Glucose 70 - 99 mg/dL 096  045  409   BUN 8 - 23 mg/dL 7  9  8    Creatinine 0.61 - 1.24 mg/dL 8.11  9.14  7.82   Sodium 135 - 145 mmol/L 138  136  134   Potassium 3.5 - 5.1 mmol/L 2.8  3.5  3.5   Chloride 98 - 111 mmol/L 105  100  100   CO2 22 - 32 mmol/L 26  23  24    Calcium 8.9 - 10.3 mg/dL 7.8  8.7  8.6   Total Protein 6.5 - 8.1 g/dL  5.5  5.3   Total Bilirubin 0.3 - 1.2 mg/dL  1.1  1.4   Alkaline Phos 38 - 126 U/L  112  113   AST 15 - 41 U/L  20  27   ALT 0 - 44 U/L  10  12      Assessment/Plan:   Principal Problem:   UTI (urinary tract infection) Active Problems:   Constipation   Dementia with behavioral disturbance (HCC)   S/p left hip fracture   Urinary retention   Normocytic anemia   Nondisplaced fracture of distal phalanx of right great toe, initial  encounter for closed fracture   ESBL E Coli Bacteremia from UTI Bladder stretch injury Fever overnight, otherwise stable VS. Improving leukocytosis on Meropenem for EBLS bacteremia. Will follow blood culture sensitivities to narrow antibiotic spectrum -Antibiotic coverage for 7 days -Meropenem 5/1 - -Maintenance IVF 171mL/HR  -Foley in place; will remain in  ~2 weeks -Delirium precautions -Mittens in place  S/p Hemiarthroplasty 01/01/2023 Post surgical hematoma vs seroma on proximal L femoral metaphysis  9.4 from admission. Hgb 8.8 today. Acute decrease likely in the setting of hemodilution after continuous fluid resuscitation.  No overt signs of bleeding today.  -Monitor CBC in the AM  Hypokalemia - Monitor and supplement pending patients PO intake  First distal non displaced great toe phalangeal fracture Soft tissue edema Unknown mechanism per [facility] staff.  -Acetaminophen 1000 mg TID  -Oxycodone 5 mg for breakthrough pain -Bowel regimen as below  -Ice PRN -Wound coverage  Constipation Moderate stool burden on CT Patient with recent surgery with limited  mobility. Per [facility], patient with loose stool ~1 week prior to admission s/p daily use of Imodium. Patient has had multiple BM since admission, last one this morning. Will discontinue bowel regimen at this time -DC bowel regimen -Monitor for PRN BM regimen needs  Alzheimer's dementia Physical deconditioning Patient oriented to self and bed confinement at baseline. Endorsing PO intake today, however, given physical deconditioning and recurrent hospital admissions with high risk of morbidity and mortality, this patient would benefit from a MOST form and hospice care. Will reach out to wife today for updates and possibly ongoing GOC conversations. -Delirium precautions  Diet: Normal VTE: SCDs Code: DNR PT/OT recs: Memory care unit Family Update: Will   Dispo: Anticipated discharge to  memory care unit  in 1- 2 days pending clinical improvement.   Morene Crocker, MD Internal Medicine Resident PGY-1 Please contact the on call pager after 5 pm and on weekends at [phone].

## 2023-01-26 NOTE — TOC Progression Note (Addendum)
Transition of Care [facility]) - Initial/Assessment Note    Patient Details  Name: Paul Klein MRN: [id number] Date of Birth: 1956-03-18  Transition of Care [facility]) CM/SW Contact:    Ralene Bathe, LCSWA Phone Number: 01/26/2023, 11:09 AM  Clinical Narrative:                 LCSW contacted [facility] and spoke with [facility] on the memory care unit.  The patient can return when medically ready.  LCSW inquired as to whether a new FL2 will be needed and was informed that it would not.  TOC following.   Expected Discharge Plan: Memory Care Barriers to Discharge: Continued Medical Work up   Patient Goals and CMS Choice   CMS Medicare.gov Compare Post Acute Care list provided to:: Patient Represenative (must comment) Choice offered to / list presented to : Spouse      Expected Discharge Plan and Services In-house Referral: Clinical Social Work   Post Acute Care Choice: Nursing Home Living arrangements for the past 2 months: Assisted Living Facility                                      Prior Living Arrangements/Services Living arrangements for the past 2 months: Assisted Living Facility Lives with:: Facility Resident Patient language and need for interpreter reviewed:: No Do you feel safe going back to the place where you live?: Yes      Need for Family Participation in Patient Care: Yes (Comment) Care giver support system in place?: Yes (comment)   Criminal Activity/Legal Involvement Pertinent to Current Situation/Hospitalization: No - Comment as needed  Activities of Daily Living      Permission Sought/Granted   Permission granted to share information with : Yes, Verbal Permission Granted (from spouse as patient has alzheimer's disease)     Permission granted to share info w AGENCY: [name]        Emotional Assessment Appearance:: Appears stated age Attitude/Demeanor/Rapport: Unable to Assess Affect (typically observed): Unable to Assess Orientation:  :  (disoriented) Alcohol / Substance Use: Not Applicable Psych Involvement: No (comment)  Admission diagnosis:  Acute cystitis with hematuria [N30.01] Sepsis (HCC) [A41.9] Altered mental status, unspecified altered mental status type [R41.82] Patient Active Problem List   Diagnosis Date Noted   Urinary retention 01/25/2023   Normocytic anemia 01/25/2023   Nondisplaced fracture of distal phalanx of right great toe, initial encounter for closed fracture 01/25/2023   Dysphagia 01/01/2023   Femur fracture, left (HCC) 12/31/2022   Closed fracture of left hip (HCC) 12/31/2022   S/p left hip fracture 12/30/2022   Alzheimer's dementia without behavioral disturbance, psychotic disturbance, mood disturbance, or anxiety (HCC) 12/26/2022   Encephalopathy 12/25/2022   Weakness 12/25/2022   Bacteremia due to Escherichia coli 12/25/2022   Dementia with behavioral disturbance (HCC) 07/09/2021   External hemorrhoid 06/02/2021   Hallucination 06/02/2021   Anxiety 06/02/2021   Rectal discomfort 06/02/2021   Advance directive discussed with patient 01/25/2021   Need for pneumococcal vaccination 01/25/2021   Hyperlipidemia 11/30/2020   Encounter for health maintenance examination in adult 11/30/2020   Medicare annual wellness visit, subsequent 11/30/2020   Alzheimer disease (HCC) 11/30/2020   Abnormal albumin 07/07/2020   Impaired fasting blood sugar 07/07/2020   Atherosclerosis of coronary artery of native heart without angina pectoris 07/07/2020   Aortic atherosclerosis (HCC) 07/07/2020   Essential hypertension, benign 04/29/2020   Urinary  frequency 04/29/2020   Overactive bladder 04/29/2020   Benign prostatic hyperplasia with urinary frequency 04/29/2020   Screen for colon cancer 04/29/2020   Dementia without behavioral disturbance (HCC) 04/15/2020   Abdominal discomfort 04/15/2020   Frequent urination 04/15/2020   Constipation 10/15/2019   Bradycardia 07/18/2018   Onychomycosis 08/30/2016    Insomnia 08/03/2015   Generalized anxiety disorder 08/03/2015   Screening for prostate cancer 08/03/2015   Need for influenza vaccination 08/03/2015   Vaccine counseling 08/03/2015   Second hand tobacco smoke exposure 07/31/2014   Dyslipidemia 07/31/2014   PCP:  System, Provider Not In Pharmacy:   [facility] - [place], [place] - [address] [place] [address] Phone: [phone] Fax: [phone]     Social Determinants of Health (SDOH) Social History: SDOH Screenings   Depression (PHQ2-9): Low Risk  (06/02/2021)  Tobacco Use: Medium Risk (01/03/2023)   SDOH Interventions:     Readmission Risk Interventions     No data to display

## 2023-01-26 NOTE — Plan of Care (Signed)

## 2023-01-27 DIAGNOSIS — R7881 Bacteremia: Secondary | ICD-10-CM | POA: Diagnosis not present

## 2023-01-27 DIAGNOSIS — N390 Urinary tract infection, site not specified: Secondary | ICD-10-CM | POA: Diagnosis not present

## 2023-01-27 DIAGNOSIS — G309 Alzheimer's disease, unspecified: Secondary | ICD-10-CM

## 2023-01-27 DIAGNOSIS — Z515 Encounter for palliative care: Secondary | ICD-10-CM

## 2023-01-27 DIAGNOSIS — R4182 Altered mental status, unspecified: Secondary | ICD-10-CM

## 2023-01-27 DIAGNOSIS — S3729XA Other injury of bladder, initial encounter: Secondary | ICD-10-CM | POA: Diagnosis not present

## 2023-01-27 DIAGNOSIS — B9620 Unspecified Escherichia coli [E. coli] as the cause of diseases classified elsewhere: Secondary | ICD-10-CM | POA: Diagnosis not present

## 2023-01-27 DIAGNOSIS — Z8781 Personal history of (healed) traumatic fracture: Secondary | ICD-10-CM

## 2023-01-27 DIAGNOSIS — S92401A Displaced unspecified fracture of right great toe, initial encounter for closed fracture: Secondary | ICD-10-CM

## 2023-01-27 DIAGNOSIS — F0280 Dementia in other diseases classified elsewhere without behavioral disturbance: Secondary | ICD-10-CM

## 2023-01-27 LAB — CULTURE, BLOOD (ROUTINE X 2): Special Requests: ADEQUATE

## 2023-01-27 LAB — BASIC METABOLIC PANEL
Anion gap: 7 (ref 5–15)
BUN: 11 mg/dL (ref 8–23)
CO2: 26 mmol/L (ref 22–32)
Calcium: 7.8 mg/dL — ABNORMAL LOW (ref 8.9–10.3)
Chloride: 108 mmol/L (ref 98–111)
Creatinine, Ser: 0.98 mg/dL (ref 0.61–1.24)
GFR, Estimated: >60 mL/min (ref >60)
Glucose, Bld: 147 mg/dL — ABNORMAL HIGH (ref 70–99)
Potassium: 3.8 mmol/L (ref 3.5–5.1)
Sodium: 141 mmol/L (ref 135–145)

## 2023-01-27 LAB — CBC
HCT: 28.8 % — ABNORMAL LOW (ref 39.0–52.0)
Hemoglobin: 9.2 g/dL — ABNORMAL LOW (ref 13.0–17.0)
MCH: 27.5 pg (ref 26.0–34.0)
MCHC: 31.9 g/dL (ref 30.0–36.0)
MCV: 86 fL (ref 80.0–100.0)
Platelets: 152 10*3/uL (ref 150–400)
RBC: 3.35 MIL/uL — ABNORMAL LOW (ref 4.22–5.81)
RDW: 16.7 % — ABNORMAL HIGH (ref 11.5–15.5)
WBC: 7.4 10*3/uL (ref 4.0–10.5)
nRBC: 0 % (ref 0.0–0.2)

## 2023-01-27 LAB — URINE CULTURE: Culture: >=100000 — AB

## 2023-01-27 LAB — GLUCOSE, CAPILLARY: Glucose-Capillary: 130 mg/dL — ABNORMAL HIGH (ref 70–99)

## 2023-01-27 MED ORDER — LACTATED RINGERS IV SOLN: INTRAVENOUS | Status: DC

## 2023-01-27 NOTE — Progress Notes (Signed)
     Referral received for Paul Klein for goals of care discussion. Chart reviewed and updates received from RN. Patient assessed and is unable to engage appropriately in discussions.   I was able to speak with patient's wife Dianne. GOC meeting scheduled for Sunday 01/29/23 @ 10:00 am. Family is aware we will meet at patient's bedside.   Thank you for your referral and allowing PMT to assist in [name]'s care.   Wynne Dust, NP Palliative Medicine Team Phone: [phone]  NO CHARGE

## 2023-01-27 NOTE — Progress Notes (Signed)
Hospital day#1 Subjective:   Summary: Paul Klein is a 67 yo [name], recurrent falls status recent hospital discharge on 4.12.2024 after L hemiarthroplasty for displaced proximal femur fracture presenting from [facility] admitted to IMTS for acute encephalopathy now being managed for ESBL bacteremia from UTI  Interim history: Patient alert, more interactive, and continues to be pleasantly confused  Objective:  Vital signs in last 24 hours: Vitals:   01/26/23 0431 01/26/23 0735 01/26/23 1550 01/27/23 0614  BP: (!) 125/49 (!) 103/54 (!) 96/57 118/60  Pulse: 77 65 63 68  Resp: 18 18 19 18   Temp: (!) 100.5 F (38.1 C) 98.6 F (37 C) 99.2 F (37.3 C) 98.2 F (36.8 C)  TempSrc: Oral Oral Oral Oral  SpO2: 99% 98% 94%   Weight:      Height:       Supplemental O2: Room Air SpO2: 94 % Filed Weights   01/24/23 2329  Weight: 72 kg    Physical Exam:  Constitutional:  Chronically ill elderly man in NAD   HENT: moist MM Cardiovascular: RRR Pulmonary/Chest: normal work of breathing Abdominal: Normal bowel sounds, non tender, non distended MSK: Surgical site without point tenderness, erythema, or fluctuance.  R first foot phalanx with ecchymoses and no active bleed Neurological: Alert but disoriented. Not responding to questions or following instructions Skin: warm   Intake/Output Summary (Last 24 hours) at 01/27/2023 1610 Last data filed at 01/26/2023 1303 Gross per 24 hour  Intake 1067.2 ml  Output --  Net 1067.2 ml   Net IO Since Admission: 283.8 mL [01/27/23 0632]   Pertinent Labs:    Latest Ref Rng & Units 01/27/2023   12:49 AM 01/26/2023    4:14 AM 01/25/2023    5:23 AM  CBC  WBC 4.0 - 10.5 K/uL 7.4  9.1  10.8   Hemoglobin 13.0 - 17.0 g/dL 9.2  8.8  96.0   Hematocrit 39.0 - 52.0 % 28.8  27.1  32.0   Platelets 150 - 400 K/uL 152  159  251        Latest Ref Rng & Units 01/27/2023   12:49 AM 01/26/2023    4:14 AM 01/25/2023    5:23 AM   CMP  Glucose 70 - 99 mg/dL 454  098  119   BUN 8 - 23 mg/dL 11  7  9    Creatinine 0.61 - 1.24 mg/dL 1.47  8.29  5.62   Sodium 135 - 145 mmol/L 141  138  136   Potassium 3.5 - 5.1 mmol/L 3.8  2.8  3.5   Chloride 98 - 111 mmol/L 108  105  100   CO2 22 - 32 mmol/L 26  26  23    Calcium 8.9 - 10.3 mg/dL 7.8  7.8  8.7   Total Protein 6.5 - 8.1 g/dL   5.5   Total Bilirubin 0.3 - 1.2 mg/dL   1.1   Alkaline Phos 38 - 126 U/L   112   AST 15 - 41 U/L   20   ALT 0 - 44 U/L   10      Assessment/Plan:   Principal Problem:   Bacteremia due to Escherichia coli Active Problems:   Constipation   Dementia with behavioral disturbance (HCC)   S/p left hip fracture   Urinary retention   Normocytic anemia   Nondisplaced fracture of distal phalanx of right great toe, initial encounter for closed fracture   Bacteremia due to Gram-negative  bacteria  ESBL E Coli Bacteremia from UTI Bladder stretch injury Stable vital signs. WBC  7.4 today on day 3 of meropenem. Continue to await culture sensitivities -Antibiotic coverage for 7 days -Meropenem 5/1 -5/7 -Foley in place; will remain in ~2 weeks  Alzheimer's dementia Physical deconditioning Patient oriented to self and bed confinement at baseline. Patient more interactive today, moving in bed. Discontinued mittens and will trial sitting at bedside recliner today. Attempted calling wife since admission without success. Will continue to attempt today. Given physical deconditioning and recurrent hospital admissions with high risk of morbidity and mortality, this patient would benefit from a MOST form and hospice care. -Attempt GOC conversation -Palliative care consulted -Delirium precautions  First distal non displaced great toe phalangeal fracture Soft tissue edema Unknown mechanism per [facility] staff. Will continue conservative management -Acetaminophen 1000 mg TID  -Oxycodone 5 mg for breakthrough pain -Bowel regimen as below  -Ice  PRN -Wound coverage  S/p Hemiarthroplasty 01/01/2023 Post surgical hematoma vs seroma on proximal L femoral metaphysis  Stable hemoglobin today. No overt signs of bleeding   Diet: Normal VTE: SCDs Code: DNR PT/OT recs: Memory care unit  Dispo: Anticipated discharge to  memory care unit  in 1- 2 days pending clinical improvement.   Morene Crocker, MD Internal Medicine Resident PGY-1 Please contact the on call pager after 5 pm and on weekends at [phone].

## 2023-01-28 LAB — GLUCOSE, CAPILLARY
Glucose-Capillary: 111 mg/dL — ABNORMAL HIGH (ref 70–99)
Glucose-Capillary: 122 mg/dL — ABNORMAL HIGH (ref 70–99)
Glucose-Capillary: 130 mg/dL — ABNORMAL HIGH (ref 70–99)

## 2023-01-28 MED ORDER — ENSURE ENLIVE PO LIQD
237.0000 mL | Freq: Two times a day (BID) | ORAL | Status: DC
  Administered 2023-01-29 – 2023-02-11 (×23): 237 mL via ORAL

## 2023-01-28 MED ORDER — SODIUM CHLORIDE 0.9 % IV SOLN: INTRAVENOUS | Status: DC | PRN

## 2023-01-28 NOTE — Progress Notes (Signed)
HD#4 SUBJECTIVE:  Patient Summary: Paul Klein is a 67 y.o. with a pertinent PMH of Alzheimer's dementia, recurrent falls and recent left proximal femur fx s/p L hemiarthroplasty in April, who presented with worsening encephalopathy and admitted for ESBL bacteremia from UTI.   Overnight Events: No acute events overnight  Interim History: The patient was evaluated at the bedside this morning. He is awake and interactive, and continues to be pleasantly confused.   OBJECTIVE:  Vital Signs: Vitals:   01/27/23 0936 01/27/23 1652 01/27/23 2130 01/28/23 0524  BP: 130/62 117/72 134/88 (!) 154/74  Pulse: 71 (!) 50 (!) 59 70  Resp:   18 18  Temp: 98.4 F (36.9 C) 97.8 F (36.6 C)  98 F (36.7 C)  TempSrc: Oral Oral    SpO2: 93% 96% 100% 94%  Weight:      Height:       Supplemental O2: Room Air SpO2: 94 %  Filed Weights   01/24/23 2329  Weight: 72 kg     Intake/Output Summary (Last 24 hours) at 01/28/2023 0645 Last data filed at 01/28/2023 0557 Gross per 24 hour  Intake 660 ml  Output 950 ml  Net -290 ml   Net IO Since Admission: -276.2 mL [01/28/23 0645]  Physical Exam: General: Pleasant, chronically ill-appearing male laying in bed. No acute distress. CV: RRR. No LE edema Pulmonary: Normal effort.  Extremities: Surgical site without point tenderness or erythema. R first foot phalanx w/ ecchymosis and no active bleeding Neuro: Alert, oriented to self only. Not responding to questions or following instructions.  Psych: Pleasantly confused   Patient Lines/Drains/Airways Status     Active Line/Drains/Airways     Name Placement date Placement time Site Days   Peripheral IV 01/24/23 20 G Left Wrist 01/24/23  --  Wrist  4   Urethral Catheter Margarita Grizzle, RN Straight-tip 14 Fr. 01/25/23  1016  Straight-tip  3   Incision (Closed) 01/25/23 Hip Left;Lateral 01/25/23  0640  -- 3   Wound / Incision (Open or Dehisced) 01/25/23 Non-pressure wound Toe (Comment  which one)  Anterior;Right red necrotic swollen great big toe with foul drainage 01/25/23  0645  Toe (Comment  which one)  3   Wound / Incision (Open or Dehisced) 01/25/23 Skin tear Ankle Left ankle and calf with layers of skin separation from fall on left side 01/25/23  0645  Ankle  3             ASSESSMENT/PLAN:  Assessment: Principal Problem:   Bacteremia due to Escherichia coli Active Problems:   Constipation   Dementia with behavioral disturbance (HCC)   S/p left hip fracture   Urinary retention   Normocytic anemia   Nondisplaced fracture of distal phalanx of right great toe, initial encounter for closed fracture   Bacteremia due to Gram-negative bacteria   Plan: ESBL E coli bacteremia 2/2 UTI Bladder stretch injury Vitals remain stable and no longer checking daily labs, as those remained stable. Sensititives say unasyn, however, with ESBL gene present, the patient is at risk of treatment failure. Will continue meropenem: today is day 4/7 of meropenem therapy (ending 5/7).  - Meropenem 5/1-5/7 - Foley in place for ~2 weeks d/t bladder stretch injury  Alzheimer's dementia Physical deconditioning The patient is more interactive, however, remains oriented to self only (baseline). We continue to have goals of care discussions with this patient's wife, and will plan for a family meeting tomorrow morning with our team + palliative medicine.  -  Delirium precautions  First distal non displaced great toe phalangeal fracture Soft tissue edema Unknown mechanism per [facility] staff. Will continue conservative management - Tylenol 1000 mg TID - Oxycodone 5 mg for breakthrough pain - Ice PRN - Keep wound covered  Best Practice: Diet: Regular diet IVF: Fluids: none VTE: enoxaparin (LOVENOX) injection 40 mg Start: 01/25/23 1500 Code: DNR AB: Meropenem Family Contact: Wife, Dianne , to be notified. DISPO: Anticipated discharge in 3-4 days to  Memory care unit  pending  IV abx therapy  .  Signature: Elza Rafter, D.O.  Internal Medicine Resident, PGY-2 [facility]  Pager: [phone] 6:45 AM, 01/28/2023   Please contact the on call pager after 5 pm and on weekends at [phone].

## 2023-01-29 DIAGNOSIS — Z515 Encounter for palliative care: Secondary | ICD-10-CM | POA: Diagnosis not present

## 2023-01-29 DIAGNOSIS — Z66 Do not resuscitate: Secondary | ICD-10-CM | POA: Diagnosis not present

## 2023-01-29 DIAGNOSIS — A4151 Sepsis due to Escherichia coli [E. coli]: Secondary | ICD-10-CM

## 2023-01-29 DIAGNOSIS — Z7189 Other specified counseling: Secondary | ICD-10-CM

## 2023-01-29 DIAGNOSIS — S92424A Nondisplaced fracture of distal phalanx of right great toe, initial encounter for closed fracture: Secondary | ICD-10-CM

## 2023-01-29 DIAGNOSIS — R4182 Altered mental status, unspecified: Secondary | ICD-10-CM | POA: Diagnosis not present

## 2023-01-29 DIAGNOSIS — N3001 Acute cystitis with hematuria: Secondary | ICD-10-CM

## 2023-01-29 LAB — GLUCOSE, CAPILLARY: Glucose-Capillary: 122 mg/dL — ABNORMAL HIGH (ref 70–99)

## 2023-01-29 NOTE — Consult Note (Signed)
Palliative Care Consult Note                                  Date: 01/29/2023   Patient Name: Paul Klein  DOB: 11-25-1955  MRN: [id number]  Age / Sex: 67 y.o., male  PCP: System, Provider Not In Referring Physician: Reymundo Poll, MD  Reason for Consultation: Establishing goals of care  HPI/Patient Profile: 67 y.o. male  with past medical history of Alzheimer's dementia, recurrent falls and recent left proximal femur fx s/p L hemiarthroplasty in April, who presented with worsening encephalopathy and admitted for ESBL bacteremia from UTI.  He was admitted on 01/24/2023 with ESBL bacteremia secondary to UTI, Alzheimer's dementia, physical deconditioning, and others.   PMT was consulted for GOC conversations.  Past Medical History:  Diagnosis Date   Alzheimer's disease (HCC)    Anxiety    Dr. Evelene Croon   Diverticulosis of colon    sigmoid, per colonoscopy 2014, Dr. Elnoria Howard   Dyslipidemia    Hemorrhoids    internal and externa per 2014 colonoscopy   Insomnia    uses Neurontin ( Dr. Evelene Croon)   Normal cardiac stress test 12/2012   performed due to risk factors, abnormal EKG; Dr. Donnie Aho   Panic attack    Second hand smoke exposure    wife    Subjective:   This NP Wynne Dust reviewed medical records, received report from team, assessed the patient and then meet at the patient's bedside to discuss diagnosis, prognosis, GOC, EOL wishes disposition and options.  I met with the patient at the bedside.  He remains pleasantly confused.  Also present at the bedside with the patient's spouse Paul Klein and daughter Paul Klein for a prearranged/prescheduled family meeting.  I was also joined by Dr. Ned Card from the internal medicine teaching service.   Concept of Palliative Care was introduced as specialized medical care for people and their families living with serious illness.  If focuses on providing relief from the symptoms and stress of a serious illness.   The goal is to improve quality of life for both the patient and the family. Values and goals of care important to patient and family were attempted to be elicited.  Created space and opportunity for patient  and family to explore thoughts and feelings regarding current medical situation   Natural trajectory and current clinical status were discussed. Questions and concerns addressed. Patient  encouraged to call with questions or concerns.    Patient/Family Understanding of Illness: They understand that he has sepsis and this is very bad.  They feel like he seems like he is okay and getting better.  They know that this started as a urinary tract infection.  They do note that leading up to his illness he has lost a lot of weight.  They know he has dementia at baseline and they state that this is progressed quite quickly.  We further discussed his chronic comorbidities and acute presentations.  We had a discussion on the trajectory of Alzheimer's dementia, especially rapidly progressing dementia.  We discussed acute illness and its effects on dementia and more progressive decline.  Life Review: The patient has been married for 38 years.  He has 1 adopted daughter Paul Klein).  He previously worked as a Education administrator, Hospital doctor, Personnel officer, and in Holiday representative.  He previously enjoyed fishing and golfing.  He enjoys rock music.  He is not very religious/not a person of faith.  They note that he was a very kind person and does not have any enemies.  Patient Values: Family, quality of life  Goals: Completion of acute illness treatment, discharge back to memory care with hospice services  Today's Discussion: In addition to discussions described above we had extensive discussion of various topics.  We explored his dementia and how acute illness can affect dementia.  We discussed that his infection overall does look better.  However, with the ESBL this generally portends a poor outcome as resistance rates are rising.   We note that he is on antibiotics until Tuesday.  The patient's wife shares that there is a family history of dementia on his side of the family and his mom had dementia.  He saw his mom's progression 3 dementia and debility.  He told her "do not let me get like that and if I do put a bullet in my head."  At this point the patient's wife remove all the guns from the home.  However, we expressed his statement as an expression of wishes for comfort care when he is unable to further enjoy life.  Family is very much in agreement with this.  We discussed comfort care and hospice. I described hospice as a service for patients who have a life expectancy of 6 months or less. The goal of hospice is the preservation of dignity and quality at the end phases of life. Under hospice care, the focus changes from curative to symptom relief.  Being that the patient currently resides at [facility] house memory care, he could receive hospice services in place at his long-term care facility.  After an extensive discussion, the family has elected to continue antibiotics for 2 more days until infection is treated.  At this point they would like him discharged home with hospice services.  During our conversation we also took time to acknowledge significant loss in the family recently.  The patient's husband, whom she is separated from a year ago due to him "getting more more out of control", passed away last 2023-03-08 from an overdose.  I expressed condolences and the difficulty in dealing with the loss of her partner as well as the impending loss of her father.  We discussed available support to help with grieving.  I provided emotional and general support through therapeutic listening, empathy, sharing of stories, therapeutic touch, and other techniques. I answered all questions and addressed all concerns to the best of my ability.  Review of Systems  Unable to perform ROS: Dementia    Objective:   Primary  Diagnoses: Present on Admission:  (Resolved) Sepsis (HCC)  Bacteremia due to Escherichia coli  Dementia with behavioral disturbance (HCC)  Constipation  Urinary retention  Normocytic anemia  Nondisplaced fracture of distal phalanx of right great toe, initial encounter for closed fracture  Bacteremia due to Gram-negative bacteria   Physical Exam Vitals and nursing note reviewed.  Constitutional:      General: He is not in acute distress.    Appearance: He is ill-appearing.  HENT:     Head: Normocephalic and atraumatic.  Cardiovascular:     Rate and Rhythm: Normal rate.  Pulmonary:     Effort: Pulmonary effort is normal. No respiratory distress.  Abdominal:     General: Abdomen is flat.     Palpations: Abdomen is soft.  Skin:    General: Skin is warm and dry.  Neurological:     Mental Status: He is alert. He is disoriented and confused.  Comments: Pleasantly confused     Vital Signs:  BP 132/60 (BP Location: Right Wrist)   Pulse (!) 58   Temp 98 F (36.7 C) (Axillary)   Resp 16   Ht 6' (1.829 m)   Wt 72 kg   SpO2 100%   BMI 21.53 kg/m   Palliative Assessment/Data: 20-30%    Advanced Care Planning:   Existing Vynca/ACP Documentation: Advanced directive signed 05/12/2021  Primary Decision Maker: NEXT OF KIN  Code Status/Advance Care Planning: DNR  A discussion was had today regarding advanced directives. Concepts specific to code status, artifical feeding and hydration, continued IV antibiotics and rehospitalization was had.  The difference between a aggressive medical intervention path and a palliative comfort care path for this patient at this time was had.   Decisions/Changes to ACP: None today  Assessment & Plan:   Impression: 67 year old male with chronic comorbidities and acute presentations as described above.  Baseline dementia which seems to be rapidly progressing now with admission for ESBL sepsis secondary to UTI.  He is responding to  antibiotics and the plan is to continue these for the last 2 days.  After significant discussion the family has elected to discharge back to [facility] house memory care, where the patient currently resides, with hospice services in place.  Long-term prognosis poor to grave.  SUMMARY OF RECOMMENDATIONS   Remain DNR Continue current scope of care Anticipate discharge 01/31/2023 to memory care with hospice services [facility] consult for hospice referral Goals are clear PMT will back off for now Please notify us of any significant clinical change or new palliative needs  Symptom Management:  Per primary team PMT is available to assist as needed  Prognosis:  < 6 months  Discharge Planning:  Skilled Nursing Facility with Hospice   Discussed with: Patient's family, medical team, nursing team, [facility] team    Thank you for allowing [name] to participate in the care of MCNEAL MILLET PMT will continue to support holistically.  Time Total: 95 min  Greater than 50%  of this time was spent counseling and coordinating care related to the above assessment and plan.  Signed by: Wynne Dust, NP Palliative Medicine Team  Team Phone # [phone] (Nights/Weekends)  01/29/2023, 10:44 AM

## 2023-01-29 NOTE — Progress Notes (Signed)
[facility], [facility]) Hospital Liaison Note  Received request from Dellie Burns, Transitions of Care Manager, for hospice services at [facility] after discharge.  Spoke with Angeline Slim, daughter, to initiate education related to hospice philosophy, services, and team approach to care.  Family verbalized understanding of information given.  DME needs discussed.  Patient has the following equipment at the facility:  hospital bed and wc.  The address has been verified and is correct in the chart.   Please send signed and completed DNR home with the patient/family.  Please provide prescriptions at discharge as needed to ensure ongoing symptom management.   [facility] information and contact numbers given to [place].  Above information shared with Dellie Burns, Transitions of Care Manager.     Please call with any Hospice related questions or concerns.  Thank you for the opportunity to participate in this patient's care.  [facility], [facility] Liaison [phone]

## 2023-01-29 NOTE — Progress Notes (Signed)
Per MD, plan for dc back to [facility] with hospice following on Tuesday. Spoke to pt's dtr [name] who confirmed dc plan. Hospice provider choice offered and dtr prefers [facility]). Referral made to [facility] with [facility]. SW will follow.  Dellie Burns, MSW, LCSW [phone] (coverage)

## 2023-01-29 NOTE — Progress Notes (Signed)
HD#5 SUBJECTIVE:  Patient Summary: Paul Klein is a 67 y.o. with a pertinent PMH of Alzheimer's dementia, recurrent falls and recent left proximal femur fx s/p L hemiarthroplasty in April, who presented with worsening encephalopathy and admitted for ESBL bacteremia from UTI   Overnight Events: No events overnight  Interim History: The patient was evaluated at the bedside. He is not in any pain and is comfortable. Remains pleasantly confused.   OBJECTIVE:  Vital Signs: Vitals:   01/28/23 0849 01/28/23 1643 01/28/23 1957 01/29/23 0533  BP: 129/64 124/87 (!) 140/71 (!) 138/59  Pulse: 63 (!) 58 70 (!) 58  Resp: 19 15    Temp: 98.6 F (37 C) 98.3 F (36.8 C) 98.5 F (36.9 C) (!) 97.5 F (36.4 C)  TempSrc: Oral Oral Oral Oral  SpO2: 97% 100%  100%  Weight:      Height:       Supplemental O2: Room Air SpO2: 100 %  Filed Weights   01/24/23 2329  Weight: 72 kg     Intake/Output Summary (Last 24 hours) at 01/29/2023 1610 Last data filed at 01/29/2023 0400 Gross per 24 hour  Intake 1272.64 ml  Output 400 ml  Net 872.64 ml   Net IO Since Admission: 696.44 mL [01/29/23 0623]  Physical Exam: General: Pleasant, chronically ill-appearing male laying in bed. No acute distress. CV: RRR. No LE edema Pulmonary: Normal work of breathing on room air Extremities: Surgical site w/o point tenderness, erythema, or edema. R foot first phalanx w/ ecchymosis. Skin: Warm and dry. No obvious rash or lesions. Neuro: Alert, oriented to self only. Does not follow instructions Psych: Pleasantly confused   Patient Lines/Drains/Airways Status     Active Line/Drains/Airways     Name Placement date Placement time Site Days   Peripheral IV 01/24/23 20 G Left Wrist 01/24/23  --  Wrist  5   Urethral Catheter Margarita Grizzle, RN Straight-tip 14 Fr. 01/25/23  1016  Straight-tip  4   Incision (Closed) 01/25/23 Hip Left;Lateral 01/25/23  0640  -- 4   Wound / Incision (Open or Dehisced) 01/25/23  Non-pressure wound Toe (Comment  which one) Anterior;Right red necrotic swollen great big toe with foul drainage 01/25/23  0645  Toe (Comment  which one)  4   Wound / Incision (Open or Dehisced) 01/25/23 Skin tear Ankle Left ankle and calf with layers of skin separation from fall on left side 01/25/23  0645  Ankle  4   Wound / Incision (Open or Dehisced) 01/28/23 Irritant Dermatitis (Moisture Associated Skin Damage) Buttocks Right;Left 01/28/23  0630  Buttocks  1             ASSESSMENT/PLAN:  Assessment: Principal Problem:   Bacteremia due to Escherichia coli Active Problems:   Constipation   Dementia with behavioral disturbance (HCC)   S/p left hip fracture   Urinary retention   Normocytic anemia   Nondisplaced fracture of distal phalanx of right great toe, initial encounter for closed fracture   Bacteremia due to Gram-negative bacteria   Plan: ESBL E coli bacteremia 2/2 UTI Bladder stretch injury Vital signs continue to remain stable, no need for daily labs. Will continue the course of meropenem, through 5/7. - Day 5 of meropenem (7 day total course) - Continue foley, remain in place for about 2 weeks  Alzheimer's dementia Physical deconditioning The patient continues to be interactive, however, he is only oriented to self and is pleasantly confused. Plan for a family meeting this morning with  our team + palliative.  - Delirium precautions   First distal non displaced great toe phalangeal fracture Soft tissue edema Unknown mechanism per [facility] staff. Will continue conservative management - Tylenol 1000 mg TID - Oxycodone 5 mg for breakthrough pain - Ice PRN - Keep wound covered  Best Practice: Diet: Regular diet IVF: Fluids: none VTE: enoxaparin (LOVENOX) injection 40 mg Start: 01/25/23 1500 Code: DNR Family Contact: Wife, to be notified. DISPO: Anticipated discharge to  Memory care unit  pending  IV antibiotic completion and goals of care  .  Signature: Elza Rafter, D.O.  Internal Medicine Resident, PGY-2 [facility]  Pager: [phone] 6:23 AM, 01/29/2023   Please contact the on call pager after 5 pm and on weekends at [phone].

## 2023-01-30 DIAGNOSIS — N390 Urinary tract infection, site not specified: Secondary | ICD-10-CM | POA: Diagnosis not present

## 2023-01-30 DIAGNOSIS — R339 Retention of urine, unspecified: Secondary | ICD-10-CM

## 2023-01-30 DIAGNOSIS — S92426D Nondisplaced fracture of distal phalanx of unspecified great toe, subsequent encounter for fracture with routine healing: Secondary | ICD-10-CM

## 2023-01-30 DIAGNOSIS — R7881 Bacteremia: Secondary | ICD-10-CM | POA: Diagnosis not present

## 2023-01-30 DIAGNOSIS — B9620 Unspecified Escherichia coli [E. coli] as the cause of diseases classified elsewhere: Secondary | ICD-10-CM | POA: Diagnosis not present

## 2023-01-30 DIAGNOSIS — G309 Alzheimer's disease, unspecified: Secondary | ICD-10-CM | POA: Diagnosis not present

## 2023-01-30 MED ORDER — SODIUM CHLORIDE 0.9 % IV SOLN
1.0000 g | INTRAVENOUS | Status: AC
  Administered 2023-01-31: 1000 mg via INTRAVENOUS
  Filled 2023-01-30: qty 1

## 2023-01-30 NOTE — Progress Notes (Signed)
[facility] 5M03 [facility]) Hospital Liaison Note  [facility] following for hospice at LTC after discharge.  Noted current plan for discharge tomorrow back to [facility] after patient antibiotic therapy completed.  Liaison will continue to follow to confirm discharge plans tomorrow.  Please don't hesitate to reach out with any hospice related questions or concerns.  Doreatha Martin, RN, BSN [facility] Liaison [phone]

## 2023-01-30 NOTE — Progress Notes (Signed)
Subjective:   Summary: Paul Klein is a 67 y.o. year old male currently admitted on the IMTS HD#4 for ESBL bacteremia 2/2 UTI.  Overnight Events:  Family meeting yesterday. Patient to d/c back to [facility] ALF memory unit with hospice on Tuesday after finishing abx. DNR but not comfort care.  Pleasantly demented.  Denies any pain or discomfort.  Discussed plan to return to memory care unit tomorrow and he is in agreement.  Objective:  Vital signs in last 24 hours: Vitals:   01/29/23 0907 01/29/23 1657 01/29/23 2056 01/30/23 0448  BP: 132/60 136/64 135/61 (!) 133/58  Pulse:  62 72 (!) 54  Resp: 16 18 18    Temp: 98 F (36.7 C) 98.3 F (36.8 C) 98.5 F (36.9 C) 98.1 F (36.7 C)  TempSrc: Axillary Axillary Axillary Oral  SpO2: 100% 100% 100% 95%  Weight:      Height:       Supplemental O2: Room Air SpO2: 95 %   Physical Exam:  Constitutional: Elderly appearing, NAD Cardiovascular: RRR, no murmurs, rubs or gallops Abdominal: soft, non-tender, non-distended Skin: warm and dry Neuro: Alert and oriented x 0   Filed Weights   01/24/23 2329  Weight: 72 kg     Intake/Output Summary (Last 24 hours) at 01/30/2023 0611 Last data filed at 01/29/2023 1800 Gross per 24 hour  Intake 417 ml  Output 600 ml  Net -183 ml   Net IO Since Admission: 513.44 mL [01/30/23 0611]  Pertinent Labs:    Latest Ref Rng & Units 01/27/2023   12:49 AM 01/26/2023    4:14 AM 01/25/2023    5:23 AM  CBC  WBC 4.0 - 10.5 K/uL 7.4  9.1  10.8   Hemoglobin 13.0 - 17.0 g/dL 9.2  8.8  30.8   Hematocrit 39.0 - 52.0 % 28.8  27.1  32.0   Platelets 150 - 400 K/uL 152  159  251        Latest Ref Rng & Units 01/27/2023   12:49 AM 01/26/2023    4:14 AM 01/25/2023    5:23 AM  CMP  Glucose 70 - 99 mg/dL 657  846  962   BUN 8 - 23 mg/dL 11  7  9    Creatinine 0.61 - 1.24 mg/dL 9.52  8.41  3.24   Sodium 135 - 145 mmol/L 141  138  136   Potassium 3.5 - 5.1 mmol/L 3.8  2.8  3.5   Chloride  98 - 111 mmol/L 108  105  100   CO2 22 - 32 mmol/L 26  26  23    Calcium 8.9 - 10.3 mg/dL 7.8  7.8  8.7   Total Protein 6.5 - 8.1 g/dL   5.5   Total Bilirubin 0.3 - 1.2 mg/dL   1.1   Alkaline Phos 38 - 126 U/L   112   AST 15 - 41 U/L   20   ALT 0 - 44 U/L   10   CBG 122  Imaging:   EKG:   Assessment/Plan:   Principal Problem:   Bacteremia due to Escherichia coli Active Problems:   Constipation   Dementia with behavioral disturbance (HCC)   S/p left hip fracture   Urinary retention   Normocytic anemia   Nondisplaced fracture of distal phalanx of right great toe, initial encounter for closed fracture   Bacteremia due to Gram-negative bacteria  Patient Summary: Paul Klein is a 67 y.o. with a pertinent PMH of Alzheimer's dementia, recurrent falls and recent left proximal femur fx s/p L hemiarthroplasty in April, who presented with worsening encephalopathy and admitted for ESBL bacteremia from UTI    ESBL E coli bacteremia 2/2 UTI Bladder stretch injury Vital signs continue to remain stable, no need for daily labs. Will continue the course of meropenem, through 5/7 and then plan on discharge - Day 6 of meropenem (7 day total course) - Continue foley, remain in place for about 2 weeks   Alzheimer's dementia Physical deconditioning The patient continues to be interactive, however, he is disoriented and is pleasantly confused.  Will be discharged back to memory unit tomorrow with hospice and full scope care. - Delirium precautions - TOC order placed for hospice - Dysphagia 1 diet   First distal non displaced great toe phalangeal fracture Soft tissue edema Unknown mechanism per [facility] staff. Will continue conservative management during hospital stay and following discharge - Tylenol 1000 mg TID - Oxycodone 5 mg for breakthrough pain - Ice PRN -Wound care  Diet:  Dysphagia 1 IVF: None,None VTE: Enoxaparin Code: DNR, full scope care.  Hospice after  discharge PT/OT recs: N/A TOC recs: Order placed for hospice placement at his memory care unit in [place] house Family Update:   Dispo: Anticipated discharge to  [facility] house  in 1 days pending completion of meropenem course.   [name], MD PGY-1 Internal Medicine Resident Please contact the on call pager after 5 pm and on weekends at [phone].

## 2023-01-30 NOTE — Plan of Care (Signed)

## 2023-01-30 NOTE — Plan of Care (Signed)
  Problem: Education: Goal: Knowledge of General Education information will improve Description Including pain rating scale, medication(s)/side effects and non-pharmacologic comfort measures Outcome: Progressing   

## 2023-01-31 DIAGNOSIS — Z515 Encounter for palliative care: Secondary | ICD-10-CM | POA: Diagnosis not present

## 2023-01-31 DIAGNOSIS — Z7189 Other specified counseling: Secondary | ICD-10-CM | POA: Diagnosis not present

## 2023-01-31 DIAGNOSIS — Z66 Do not resuscitate: Secondary | ICD-10-CM | POA: Diagnosis not present

## 2023-01-31 DIAGNOSIS — R4182 Altered mental status, unspecified: Secondary | ICD-10-CM | POA: Diagnosis not present

## 2023-01-31 MED ORDER — POLYETHYLENE GLYCOL 3350 17 G PO PACK: 17.0000 g | PACK | Freq: Every day | ORAL | Status: DC | PRN

## 2023-01-31 MED ORDER — OXYCODONE HCL 5 MG PO TABS: 5.0000 mg | ORAL_TABLET | Freq: Four times a day (QID) | ORAL | 0 refills | Status: DC | PRN

## 2023-01-31 MED ORDER — POLYETHYLENE GLYCOL 3350 17 G PO PACK: 17.0000 g | PACK | Freq: Every day | ORAL | 0 refills | Status: DC | PRN

## 2023-01-31 MED ORDER — SENNA 8.6 MG PO TABS
1.0000 | ORAL_TABLET | Freq: Every day | ORAL | Status: DC
  Administered 2023-01-31 – 2023-02-11 (×8): 8.6 mg via ORAL
  Filled 2023-01-31 (×11): qty 1

## 2023-01-31 MED ORDER — ENSURE ENLIVE PO LIQD: 237.0000 mL | Freq: Two times a day (BID) | ORAL | 12 refills | Status: DC

## 2023-01-31 MED ORDER — MUPIROCIN CALCIUM 2 % EX CREA: TOPICAL_CREAM | Freq: Every day | CUTANEOUS | 0 refills | Status: DC

## 2023-01-31 MED ORDER — SENNA 8.6 MG PO TABS: 1.0000 | ORAL_TABLET | Freq: Every day | ORAL | 0 refills | Status: DC

## 2023-01-31 NOTE — Progress Notes (Signed)
MC 5M03 [facility]) Hospital Liaison Note  Noted patient to discharge today back to [facility].  [facility] hospice services will follow patient at facility.  Please reach out with any hospice related questions or concerns.  Doreatha Martin, RN, BSN [facility] Liaison

## 2023-01-31 NOTE — Progress Notes (Signed)
Daily Progress Note   Patient Name: Paul Klein       Date: 01/31/2023 DOB: [birth year]  Age: 67 y.o. MRN#: [id number] Attending Physician: Ginnie Smart, MD Primary Care Physician: System, Provider Not In Admit Date: 01/24/2023 Length of Stay: 5 days  Reason for Consultation/Follow-up: Establishing goals of care  HPI/Patient Profile:  67 y.o. male  with past medical history of Alzheimer's dementia, recurrent falls and recent left proximal femur fx s/p L hemiarthroplasty in April, who presented with worsening encephalopathy and admitted for ESBL bacteremia from UTI.  He was admitted on 01/24/2023 with ESBL bacteremia secondary to UTI, Alzheimer's dementia, physical deconditioning, and others.    PMT was consulted for GOC conversations.  Subjective:   Subjective: Chart Reviewed. Updates received. Patient Assessed. Created space and opportunity for patient  and family to explore thoughts and feelings regarding current medical situation.  Today's Discussion: Today saw the patient at the bedside, bedside nurse was present.  The patient seems more calm today, and although he remains quite confused he seems to be answering questions appropriately more so than previous days.  He denies pain, does not answer to questions about nausea or vomiting.  Per nursing he did not really eat anything on his tray but he did drink an Ensure.  Discussed with nursing staff the plan for discharge today back to [facility] with hospice services in place as previously outlined and the note from 01/29/2023.  Family is on board with this at this time.  Only barrier to discharge at this point seems to be another dose of antibiotics to complete his course.  I provided emotional and general support through therapeutic listening, empathy, sharing of stories, therapeutic touch, and other techniques. I answered all questions and addressed all concerns to the best of my ability.  ROS Limited due to advanced dementia Review  of Systems  Constitutional:        Denies pain in general    Objective:   Vital Signs:  BP 117/66 (BP Location: Left Arm)   Pulse (!) 53   Temp 98.1 F (36.7 C) (Axillary)   Resp 19   Ht 6' (1.829 m)   Wt 72 kg   SpO2 98%   BMI 21.53 kg/m   Physical Exam: Physical Exam Vitals reviewed.  Constitutional:      General: He is not in acute distress.    Appearance: He is ill-appearing.  HENT:     Head: Normocephalic and atraumatic.  Cardiovascular:     Rate and Rhythm: Normal rate.  Pulmonary:     Effort: Pulmonary effort is normal. No respiratory distress.  Abdominal:     General: Abdomen is flat. Bowel sounds are normal. There is no distension.     Palpations: Abdomen is soft.     Tenderness: There is no abdominal tenderness.  Skin:    General: Skin is warm and dry.  Neurological:     Mental Status: He is alert. He is disoriented and confused.  Psychiatric:        Mood and Affect: Mood normal.        Behavior: Behavior normal.     Palliative Assessment/Data: 20%    Existing Vynca/ACP Documentation: Advanced directive signed 05/12/2021   Assessment & Plan:   Impression: Present on Admission:  (Resolved) Sepsis (HCC)  Bacteremia due to Escherichia coli  Dementia with behavioral disturbance (HCC)  Constipation  Urinary retention  Normocytic anemia  Nondisplaced fracture of distal phalanx of right great toe, initial  encounter for closed fracture  Bacteremia due to Gram-negative bacteria  SUMMARY OF RECOMMENDATIONS   DNR Completed antibiotics Anticipate discharge today back to [facility] memory care with palliative services in place Please notify us if significant clinical change or new palliative needs  Symptom Management:  Per primary team PMT is available to assist as needed  Code Status: DNR  Prognosis: < 6 months  Discharge Planning:  Memory Care/LTC with hospice services  Discussed with: Medical team, nursing team  Thank you for  allowing [name] to participate in the care of Paul Klein PMT will continue to support holistically.  Billing based on MDM: Moderate  Wynne Dust, NP Palliative Medicine Team  Team Phone # [phone] (Nights/Weekends)  05/25/2021, 8:17 AM

## 2023-01-31 NOTE — TOC Transition Note (Signed)
Transition of Care [facility]) - CM/SW Discharge Note   Patient Details  Name: Paul Klein MRN: [id number] Date of Birth: Apr 18, 1956  [facility] CM/SW Contact:  Ralene Bathe, LCSWA Phone Number: 01/31/2023, 12:53 PM   Clinical Narrative:    Patient will DC to: [facility] Anticipated DC date: 01/31/2023 Family notified: Yes, spouse Dianne Transport [id number]   Per MD patient ready for DC to Memory Care Facility. RN to call report prior to discharge [phone]). RN, patient's family, and facility notified of DC. Discharge Summary sent to facility. DC packet on chart. Ambulance transport requested for patient.   CSW will sign off for now as social work intervention is no longer needed. Please consult [name] again if new needs arise.    Final next level of care: Memory Care Barriers to Discharge: Barriers Resolved   Patient Goals and CMS Choice CMS Medicare.gov Compare Post Acute Care list provided to:: Patient Represenative (must comment) Choice offered to / list presented to : Spouse  Discharge Placement                Patient [facility] bed at:  [facility]) Patient to be transferred to facility by: [facility] Name of family member notified: Robin,Dianne (Spouse) [phone] Patient and family notified of of transfer: 01/31/23  Discharge Plan and Services Additional resources added to the After Visit Summary for   In-house Referral: Clinical Social Work   Post Acute Care Choice: Nursing Home                               Social Determinants of Health (SDOH) Interventions SDOH Screenings   Depression (PHQ2-9): Low Risk  (06/02/2021)  Tobacco Use: Medium Risk (01/03/2023)     Readmission Risk Interventions     No data to display

## 2023-01-31 NOTE — Progress Notes (Signed)
Subjective:   Summary: Paul Klein is a 67 y.o. year old male currently admitted on the IMTS HD#6 for ESBL bacteremia 2/2 UTI.  Overnight Events:  Discharge yesterday canceled due to conflict with Foley catheter.  Started trial of void yesterday.  Bladder scan overnight was 999 cc, Foley catheter replaced.  He is alert and oriented x 1 and appears comfortable at time of assessment this morning.  Objective:  Vital signs in last 24 hours: Vitals:   01/31/23 0659 01/31/23 0823 01/31/23 1640 01/31/23 2143  BP: 136/61 117/66 (!) 114/50 118/61  Pulse: (!) 54 (!) 53 62 60  Resp: 18 19 19 19   Temp: 99.1 F (37.3 C) 98.1 F (36.7 C) 98 F (36.7 C) 98.1 F (36.7 C)  TempSrc: Oral Axillary Oral Oral  SpO2: 97% 98% 96% 97%  Weight:      Height:       Supplemental O2: Room Air SpO2: 97 %   Physical Exam:  Constitutional: Elderly appearing, NAD, appears comfortable Cardiovascular: RRR, no murmurs, rubs or gallops Abdominal: soft, non-tender, non-distended Skin: warm and dry Neuro: Alert and oriented x 1   Filed Weights   01/24/23 2329  Weight: 72 kg     Intake/Output Summary (Last 24 hours) at 02/01/2023 0625 Last data filed at 02/01/2023 0522 Gross per 24 hour  Intake 1072 ml  Output 450 ml  Net 622 ml   Net IO Since Admission: -169.84 mL [02/01/23 0625]  Pertinent Labs:    Latest Ref Rng & Units 01/27/2023   12:49 AM 01/26/2023    4:14 AM 01/25/2023    5:23 AM  CBC  WBC 4.0 - 10.5 K/uL 7.4  9.1  10.8   Hemoglobin 13.0 - 17.0 g/dL 9.2  8.8  16.1   Hematocrit 39.0 - 52.0 % 28.8  27.1  32.0   Platelets 150 - 400 K/uL 152  159  251        Latest Ref Rng & Units 01/27/2023   12:49 AM 01/26/2023    4:14 AM 01/25/2023    5:23 AM  CMP  Glucose 70 - 99 mg/dL 096  045  409   BUN 8 - 23 mg/dL 11  7  9    Creatinine 0.61 - 1.24 mg/dL 8.11  9.14  7.82   Sodium 135 - 145 mmol/L 141  138  136   Potassium 3.5 - 5.1 mmol/L 3.8  2.8  3.5   Chloride 98 - 111  mmol/L 108  105  100   CO2 22 - 32 mmol/L 26  26  23    Calcium 8.9 - 10.3 mg/dL 7.8  7.8  8.7   Total Protein 6.5 - 8.1 g/dL   5.5   Total Bilirubin 0.3 - 1.2 mg/dL   1.1   Alkaline Phos 38 - 126 U/L   112   AST 15 - 41 U/L   20   ALT 0 - 44 U/L   10    Imaging:   EKG:   Assessment/Plan:   Principal Problem:   Bacteremia due to Escherichia coli Active Problems:   Constipation   Dementia with behavioral disturbance (HCC)   S/p left hip fracture   Urinary retention   Normocytic anemia   Nondisplaced fracture of distal phalanx of right great toe, initial encounter for closed fracture   Bacteremia due to Gram-negative bacteria   Altered mental status  Patient Summary: Paul Klein is a 67 y.o. with a pertinent PMH of Alzheimer's dementia, recurrent falls and recent left proximal femur fx s/p L hemiarthroplasty in April, who presented with worsening encephalopathy and admitted for ESBL bacteremia from UTI   ESBL E coli bacteremia 2/2 UTI Bladder stretch injury Chronic urinary retention Vitals remained stable.  He completed course of antibiotics.  Trial of void overnight has failed and he has Foley bag in place.  Will have to look into other possibilities for dispo.  Will also trial Flomax and see if this helps.  Can start today and perform another trial of void tomorrow.  Given his lack of mobility, less concerned about orthostatic symptoms. - Continue foley - Start Flomax today, repeat trial of void tomorrow   Alzheimer's dementia Physical deconditioning The patient continues to be interactive, however, he is disoriented and is pleasantly confused.  Unfortunately cannot be discharged back to memory unit while he has a Foley.  Will try and see if he can be placed in hospice inpatient facility or look at other options.  If he succeeds trial of void tomorrow, he could possibly go back to his memory unit without catheter in place. - Delirium precautions - TOC order placed for  hospice - Dysphagia 1 diet   First distal non displaced great toe phalangeal fracture Soft tissue edema Unknown mechanism per [facility] staff. Will continue conservative management during hospital stay and following discharge - Tylenol 1000 mg TID - Oxycodone 5 mg for breakthrough pain - Ice PRN -Wound care  Diet:  Dysphagia 1 IVF: None,None VTE: Enoxaparin Code: DNR, full scope care.  Hospice after discharge PT/OT recs: N/A TOC recs: Order placed for hospice placement at his memory care unit in [facility] Family Update:   Dispo: Anticipated discharge to  [facility] house  in 1 days pending completion of meropenem course.   [name], MD PGY-1 Internal Medicine Resident Please contact the on call pager after 5 pm and on weekends at [phone].

## 2023-01-31 NOTE — Discharge Summary (Signed)
Name: Paul Klein MRN: [id number] DOB: 12/13/1955 67 y.o. PCP: System, Provider Not In  Date of Admission: 01/24/2023 10:36 PM Date of Discharge: 01/31/2023 Attending Physician: Dr.  Ninetta Lights  Discharge Diagnosis: Principal Problem:   Bacteremia due to Escherichia coli Active Problems:   Constipation   Dementia with behavioral disturbance (HCC)   S/p left hip fracture   Urinary retention   Normocytic anemia   Nondisplaced fracture of distal phalanx of right great toe, initial encounter for closed fracture   Bacteremia due to Gram-negative bacteria    Discharge Medications: Allergies as of 01/31/2023       Reactions   Prozac [fluoxetine Hcl]    SSRIs in general cause worsened depression and suicidal ideation Reaction listed per [facility] *anaphylaxis*        Medication List     STOP taking these medications    aspirin EC 81 MG tablet   atorvastatin 20 MG tablet Commonly known as: LIPITOR   HYDROcodone-acetaminophen 5-325 MG tablet Commonly known as: NORCO/VICODIN       TAKE these medications    acetaminophen 325 MG tablet Commonly known as: TYLENOL Take 2 tablets (650 mg total) by mouth every 6 (six) hours as needed for moderate pain.   clonazePAM 0.5 MG tablet Commonly known as: KlonoPIN Take 1 tablet (0.5 mg total) by mouth at bedtime. What changed: when to take this   feeding supplement Liqd Take 237 mLs by mouth 2 (two) times daily between meals.   multivitamin with minerals Tabs tablet Take 1 tablet by mouth daily.   mupirocin cream 2 % Commonly known as: BACTROBAN Apply topically daily. Start taking on: Feb 01, 2023   neomycin-bacitracin-polymyxin [phone] ointment Apply 1 Application topically as needed (for minor skin tears or abrasions).   oxyCODONE 5 MG immediate release tablet Commonly known as: Oxy IR/ROXICODONE Take 1 tablet (5 mg total) by mouth every 6 (six) hours as needed for breakthrough pain.   polyethylene glycol 17 g  packet Commonly known as: MIRALAX / GLYCOLAX Take 17 g by mouth daily as needed for mild constipation.   senna 8.6 MG Tabs tablet Commonly known as: SENOKOT Take 1 tablet (8.6 mg total) by mouth daily. Start taking on: Feb 01, 2023   Super Omega 3 EPA/DHA 1000 MG Caps Take 2 capsules by mouth at bedtime.               Discharge Care Instructions  (From admission, onward)           Start     Ordered   01/31/23 0000  Discharge wound care:       Comments: 1. Apply Bactroban to right great toe wound Q day, then cover with gauze and tape 2. Apply Xeroform gauze and ABD pad to left posterior calf and ankle Q day, then cover with kerlex   01/31/23 1114            Disposition and follow-up:   [name] was discharged from [facility] in Stable condition.  At the hospital follow up visit please address:  1.  Follow-up: 1.  He was treated for ESBL bacteremia with urine as likely source.  He also had chronic urinary retention.  He will be discharged with Foley in place.  Foley should remain in for at least 1 more week and then can consider removing if he is passing voiding trials.  Might consider maintaining chronic Foley for comfort.  2.  Patient has history of  advanced Alzheimer's dementia with physical deconditioning.  He is on a dysphagia 1 diet with limited p.o. intake.  He has baseline is alert and oriented x 1 (to self) but he does wax and wane as well.  He is DNR but not comfort care  3.  He has a right big toe fracture which will be managed conservatively.  Can be managed with Tylenol and will also prescribe oxycodone temporarily for breakthrough pain.  Ice and wound care   2.  Labs / imaging needed at time of follow-up: none  3.  Pending labs/ test needing follow-up: none  Follow-up Appointments:   Hospital Course by problem list: Paul Klein is a 67 yo person living at memory care for Alzheimer's dementia who was admitted to the IMTS  at [facility] for acute encephalopathy, treated for sepsis secondary to  ESBL bacteremia.  Goals of care were discussed extensively during the admission and the patient will be returning to his memory care unit with hospice.  He is DNR but not comfort care.    Transition to hospice care Alzheimer's dementia Physical deconditioning Patient is only oriented to self and has limited physical mobility with severe deconditioning at baseline. Palliative care was consulted during this admission given patient's functional decline.an repeated hospital admissions in the past 3 months. After discussion of Mr. Skuse goals of care with his family, IM and palliative care team, decision was made to discharge patient to [facility] memory unit with [facility] to manage hospice care.  He is currently DNR with hospice but not comfort care.  Of note, he also has limited p.o. intake and nutrition is supplemented with ensures.  He has a dysphagia 1 diet.  Sepsis ESBL E coli bacteremia from UTI Bladder stretch injury Patient presented from [facility] with acute encephalopathy, tachycardic, and hypotensive admitted for sepsis.on 5/1.Patient's bladder ultrasound  revealed severely distended bladder on admission, with >1L of urine output after foley insertion.  U/A and blood cultures grew ESBL E coli within 24 hours of admission and patient was started on meropenem. Patient level of alertness and interaction improved while on therapy. Patient 's infection was treated with Meropenem for a total of 6 days of therapy (ertapenem on day 7 to prep for discharge). Patient 's to be discharged on foley catheter for bladder stretch injury until 5/14 and then can consider removal if he passes voiding trials.  First distal non displaced great toe phalangeal fracture Soft tissue edema Unknown mechanism per [facility] staff. This was treated conservatively during this admission as patient's physical mobility  was not limited by this injury. Continue wound care at discharge.   S/p Hemiarthroplasty 01/01/2023 Post surgical hematoma vs seroma on proximal L femoral metaphysis  Patient was recently discharged from IMTS service after a hemiarthroplasty on 4/7. Patient's surgical site was scanned during admission for sepsis and surgical site infection was suspected. Imaging revealed a small fluid collection on proximal posterior L femoral metaphysis likely in the setting of a hematoma vs seroma. IT drainage was deferred as there was no acute tenderness and alternate infection site was identified. Patient's hemoglobin was monitored and stable during this admission.  No point tenderness or signs of surgical site infection was noted otherwise  Subjective: Patient is pleasantly demented and mildly confused today.  Similar to baseline.  Does not seem to be in any pain or discomfort.  No family at bedside.  Discharge Vitals:   BP 117/66 (BP Location: Left Arm)  Pulse (!) 53   Temp 98.1 F (36.7 C) (Axillary)   Resp 19   Ht 6' (1.829 m)   Wt 72 kg   SpO2 98%   BMI 21.53 kg/m  Discharge exam: General: NAD CV: RRR. No murmurs, rubs, or gallops. No LE edema Abdominal: Soft, nontender, nondistended.  Foley in place Skin: Warm and dry. No obvious rash or lesions. Neuro: Alert and oriented x 0.  Pleasantly demented.  Awake and interacts.    Pertinent Labs, Studies, and Procedures:     Latest Ref Rng & Units 01/27/2023   12:49 AM 01/26/2023    4:14 AM 01/25/2023    5:23 AM  CBC  WBC 4.0 - 10.5 K/uL 7.4  9.1  10.8   Hemoglobin 13.0 - 17.0 g/dL 9.2  8.8  40.9   Hematocrit 39.0 - 52.0 % 28.8  27.1  32.0   Platelets 150 - 400 K/uL 152  159  251        Latest Ref Rng & Units 01/27/2023   12:49 AM 01/26/2023    4:14 AM 01/25/2023    5:23 AM  CMP  Glucose 70 - 99 mg/dL 811  914  782   BUN 8 - 23 mg/dL 11  7  9    Creatinine 0.61 - 1.24 mg/dL 9.56  2.13  0.86   Sodium 135 - 145 mmol/L 141  138  136   Potassium  3.5 - 5.1 mmol/L 3.8  2.8  3.5   Chloride 98 - 111 mmol/L 108  105  100   CO2 22 - 32 mmol/L 26  26  23    Calcium 8.9 - 10.3 mg/dL 7.8  7.8  8.7   Total Protein 6.5 - 8.1 g/dL   5.5   Total Bilirubin 0.3 - 1.2 mg/dL   1.1   Alkaline Phos 38 - 126 U/L   112   AST 15 - 41 U/L   20   ALT 0 - 44 U/L   10     CT ABDOMEN PELVIS W CONTRAST  Result Date: 01/25/2023 CLINICAL DATA:  Abdominal pain. Tenderness to palpation. Baseline dementia. EXAM: CT ABDOMEN AND PELVIS WITH CONTRAST TECHNIQUE: Multidetector CT imaging of the abdomen and pelvis was performed using the standard protocol following bolus administration of intravenous contrast. RADIATION DOSE REDUCTION: This exam was performed according to the departmental dose-optimization program which includes automated exposure control, adjustment of the mA and/or kV according to patient size and/or use of iterative reconstruction technique. CONTRAST:  75mL OMNIPAQUE IOHEXOL 350 MG/ML SOLN COMPARISON:  CT without contrast 08/09/2021, CT with contrast 08/08/2021 FINDINGS: Lower chest: Lung bases are clear with mild chronic elevation right diaphragm. The cardiac size is normal. There are scattered calcifications in the left main and LAD coronary artery Hepatobiliary: The liver is mildly steatotic. No mass is seen. Unremarkable gallbladder and bile ducts. Pancreas: No abnormality. Spleen: No abnormality.  No splenomegaly. Adrenals/Urinary Tract: There is no adrenal or renal cortical mass enhancement. No intrarenal stone is seen. There is symmetric mild bilateral hydroureteronephrosis extending to the bladder, which itself is moderately distended with the dome reaching as high as the L4-5 level. No focal wall thickening is evident, with partial obscuration of the left posterolateral bladder wall by spray artifact from a left hip arthroplasty. The visualized bladder wall is unremarkable. Stomach/Bowel: The stomach is contracted. The small bowel is normal caliber. The  appendix is unremarkable. There is moderate retained stool in the ascending colon and in the rectum. There  are scattered diverticulosis without evidence of diverticulitis. Vascular/Lymphatic: Aortic atherosclerosis. No enlarged abdominal or pelvic lymph nodes. Reproductive: Mild prostatomegaly with transverse axis 4.7 cm. Other: There are no incarcerated hernias. There is no free air, free hemorrhage or free fluid. Musculoskeletal: There is mild edema in the upper thighs. There is a left hip arthroplasty which was placed 01/01/2023 according to Epic notes. There is a hypodense collection posterior to proximal left femoral metaphysis measuring 6.0 x 2.8 x 5.1 cm, with scattered calcifications along its periphery. This is probably a hematoma as it measures 40-50 Hounsfield units. Infectious complication not strictly excluded. There is mild osteopenia with degenerative changes spine. No regional skeletal fractures. Bridging osteophytes anterior right SI joint. No destructive bone lesions. IMPRESSION: 1. Moderate bladder distention with symmetric mild bilateral hydroureteronephrosis. No stone disease. Findings could be due to bladder outlet obstruction or neurogenic bladder. Correlate clinically for cystitis. 2. Mild prostatomegaly. 3. Constipation and diverticulosis. 4. Aortic and coronary artery atherosclerosis. 5. Left hip arthroplasty with 6.0 x 2.8 x 5.1 cm hypodense collection posterior to the proximal left femoral metaphysis, probably a hematoma. Infectious complication not strictly excluded. 6. Mild hepatic steatosis. Aortic Atherosclerosis (ICD10-I70.0). Electronically Signed   By: Almira Bar M.D.   On: 01/25/2023 03:15   DG Toe Great Right  Result Date: 01/25/2023 CLINICAL DATA:  Recent trauma with first toe pain, initial encounter EXAM: RIGHT GREAT TOE COMPARISON:  01/24/2023 FINDINGS: Fracture of the first distal phalanx is noted. Mild soft tissue swelling is seen. No other focal abnormality is noted.  IMPRESSION: First distal phalangeal fracture with evidence of soft tissue swelling. Electronically Signed   By: Alcide Clever M.D.   On: 01/25/2023 02:12   DG Hip Unilat W or Wo Pelvis 2-3 Views Left  Result Date: 01/25/2023 CLINICAL DATA:  Possible sepsis EXAM: DG HIP (WITH OR WITHOUT PELVIS) 2-3V LEFT COMPARISON:  12/30/2022 FINDINGS: Left hip replacement with intact hardware and normal alignment. No acute fracture. Pubic symphysis and rami appear intact. SI joints are non widened. Edema within the soft tissues overlying the left hip IMPRESSION: Left hip replacement. No acute osseous abnormality. Electronically Signed   By: Jasmine Pang M.D.   On: 01/25/2023 00:05   DG Foot Complete Right  Result Date: 01/25/2023 CLINICAL DATA:  Pain EXAM: RIGHT FOOT COMPLETE - 3+ VIEW COMPARISON:  None Available. FINDINGS: Limited by technique and position. No gross fracture or malalignment. Degenerative changes of the IP joints. Diffuse soft tissue swelling. Vascular calcifications. IMPRESSION: Limited by technique and position. No gross acute osseous abnormality. Electronically Signed   By: Jasmine Pang M.D.   On: 01/25/2023 00:02   DG Chest Port 1 View  Result Date: 01/24/2023 CLINICAL DATA:  Possible sepsis EXAM: PORTABLE CHEST 1 VIEW COMPARISON:  12/30/2022 FINDINGS: No acute airspace disease or effusion. Cardiomediastinal silhouette within normal limits. Aortic atherosclerosis. IMPRESSION: No active disease. Electronically Signed   By: Jasmine Pang M.D.   On: 01/24/2023 23:59     Discharge Instructions: Discharge Instructions     Call MD for:  persistant nausea and vomiting   Complete by: As directed    Call MD for:  redness, tenderness, or signs of infection (pain, swelling, redness, odor or green/yellow discharge around incision site)   Complete by: As directed    Call MD for:  severe uncontrolled pain   Complete by: As directed    Call MD for:  temperature >100.4   Complete by: As directed     Diet -  low sodium heart healthy   Complete by: As directed    Discharge instructions   Complete by: As directed    1.  He was treated for ESBL bacteremia with urine as likely source.  He also had chronic urinary retention.  He will be discharged with Foley in place.  Foley should remain in for at least 1 more week and then can consider removing if he is passing voiding trials.  Might consider maintaining chronic Foley for comfort. 2.  Patient has history of advanced Alzheimer's dementia with physical deconditioning.  He is on a dysphagia 1 diet with limited p.o. intake.  He has baseline is alert and oriented x 1 (to self) but he does wax and wane as well.  He is DNR but not comfort care 3.  He has a right big toe fracture which will be managed conservatively.  Can be managed with Tylenol and will also prescribe oxycodone temporarily for breakthrough pain.  Ice and wound care as well.   Discharge wound care:   Complete by: As directed    1. Apply Bactroban to right great toe wound Q day, then cover with gauze and tape 2. Apply Xeroform gauze and ABD pad to left posterior calf and ankle Q day, then cover with kerlex   Increase activity slowly   Complete by: As directed        Discharge Instructions   None     Signed: [name], MD 01/31/2023, 11:24 AM   Pager: [phone]

## 2023-01-31 NOTE — TOC Progression Note (Signed)
Transition of Care [facility]) - Initial/Assessment Note    Patient Details  Name: Paul Klein MRN: [id number] Date of Birth: September 02, 1956  [facility]) CM/SW Contact:    Ralene Bathe, LCSWA Phone Number: 01/31/2023, 2:16 PM  Clinical Narrative:                 Per MD, discharge cancelled for today.    TOC following.  Expected Discharge Plan: Memory Care Barriers to Discharge: Barriers Resolved   Patient Goals and CMS Choice   CMS Medicare.gov Compare Post Acute Care list provided to:: Patient Represenative (must comment) Choice offered to / list presented to : Spouse      Expected Discharge Plan and Services In-house Referral: Clinical Social Work   Post Acute Care Choice: Nursing Home Living arrangements for the past 2 months: Assisted Living Facility Expected Discharge Date: 01/31/23                                    Prior Living Arrangements/Services Living arrangements for the past 2 months: Assisted Living Facility Lives with:: Facility Resident Patient language and need for interpreter reviewed:: No Do you feel safe going back to the place where you live?: Yes      Need for Family Participation in Patient Care: Yes (Comment) Care giver support system in place?: Yes (comment)   Criminal Activity/Legal Involvement Pertinent to Current Situation/Hospitalization: No - Comment as needed  Activities of Daily Living      Permission Sought/Granted   Permission granted to share information with : Yes, Verbal Permission Granted (from spouse as patient has alzheimer's disease)     Permission granted to share info w AGENCY: [name]        Emotional Assessment Appearance:: Appears stated age Attitude/Demeanor/Rapport: Unable to Assess Affect (typically observed): Unable to Assess Orientation: :  (disoriented) Alcohol / Substance Use: Not Applicable Psych Involvement: No (comment)  Admission diagnosis:  Acute cystitis with hematuria  [N30.01] Sepsis (HCC) [A41.9] Altered mental status, unspecified altered mental status type [R41.82] Bacteremia due to Gram-negative bacteria [R78.81] Patient Active Problem List   Diagnosis Date Noted   Altered mental status 01/31/2023   Bacteremia due to Gram-negative bacteria 01/26/2023   Urinary retention 01/25/2023   Normocytic anemia 01/25/2023   Nondisplaced fracture of distal phalanx of right great toe, initial encounter for closed fracture 01/25/2023   Dysphagia 01/01/2023   Femur fracture, left (HCC) 12/31/2022   Closed fracture of left hip (HCC) 12/31/2022   S/p left hip fracture 12/30/2022   Alzheimer's dementia without behavioral disturbance, psychotic disturbance, mood disturbance, or anxiety (HCC) 12/26/2022   Encephalopathy 12/25/2022   Weakness 12/25/2022   Bacteremia due to Escherichia coli 12/25/2022   Dementia with behavioral disturbance (HCC) 07/09/2021   External hemorrhoid 06/02/2021   Hallucination 06/02/2021   Anxiety 06/02/2021   Rectal discomfort 06/02/2021   Advance directive discussed with patient 01/25/2021   Need for pneumococcal vaccination 01/25/2021   Hyperlipidemia 11/30/2020   Encounter for health maintenance examination in adult 11/30/2020   Medicare annual wellness visit, subsequent 11/30/2020   Alzheimer disease (HCC) 11/30/2020   Abnormal albumin 07/07/2020   Impaired fasting blood sugar 07/07/2020   Atherosclerosis of coronary artery of native heart without angina pectoris 07/07/2020   Aortic atherosclerosis (HCC) 07/07/2020   Essential hypertension, benign 04/29/2020   Urinary frequency 04/29/2020   Overactive bladder 04/29/2020   Benign prostatic hyperplasia with  urinary frequency 04/29/2020   Screen for colon cancer 04/29/2020   Dementia without behavioral disturbance (HCC) 04/15/2020   Abdominal discomfort 04/15/2020   Frequent urination 04/15/2020   Constipation 10/15/2019   Bradycardia 07/18/2018   Onychomycosis 08/30/2016    Insomnia 08/03/2015   Generalized anxiety disorder 08/03/2015   Screening for prostate cancer 08/03/2015   Need for influenza vaccination 08/03/2015   Vaccine counseling 08/03/2015   Second hand tobacco smoke exposure 07/31/2014   Dyslipidemia 07/31/2014   PCP:  System, Provider Not In Pharmacy:   [facility] [place], [place] - [address] [place] [address] Phone: [phone] Fax: [phone]  [facility] [address] [place] [address] Phone: [phone] Fax: [phone]     Social Determinants of Health (SDOH) Social History: SDOH Screenings   Depression (PHQ2-9): Low Risk  (06/02/2021)  Tobacco Use: Medium Risk (01/03/2023)   SDOH Interventions:     Readmission Risk Interventions     No data to display

## 2023-02-01 DIAGNOSIS — R600 Localized edema: Secondary | ICD-10-CM

## 2023-02-01 DIAGNOSIS — Z66 Do not resuscitate: Secondary | ICD-10-CM | POA: Diagnosis not present

## 2023-02-01 DIAGNOSIS — N390 Urinary tract infection, site not specified: Secondary | ICD-10-CM | POA: Diagnosis not present

## 2023-02-01 DIAGNOSIS — B9620 Unspecified Escherichia coli [E. coli] as the cause of diseases classified elsewhere: Secondary | ICD-10-CM | POA: Diagnosis not present

## 2023-02-01 DIAGNOSIS — Z7189 Other specified counseling: Secondary | ICD-10-CM | POA: Diagnosis not present

## 2023-02-01 DIAGNOSIS — Z515 Encounter for palliative care: Secondary | ICD-10-CM | POA: Diagnosis not present

## 2023-02-01 DIAGNOSIS — G309 Alzheimer's disease, unspecified: Secondary | ICD-10-CM | POA: Diagnosis not present

## 2023-02-01 DIAGNOSIS — R7881 Bacteremia: Secondary | ICD-10-CM | POA: Diagnosis not present

## 2023-02-01 DIAGNOSIS — R4182 Altered mental status, unspecified: Secondary | ICD-10-CM | POA: Diagnosis not present

## 2023-02-01 MED ORDER — TAMSULOSIN HCL 0.4 MG PO CAPS
0.4000 mg | ORAL_CAPSULE | Freq: Every day | ORAL | Status: DC
  Administered 2023-02-01 – 2023-02-11 (×11): 0.4 mg via ORAL
  Filled 2023-02-01 (×11): qty 1

## 2023-02-01 NOTE — Discharge Summary (Addendum)
Name: HURLEY BAZE MRN: [id number] DOB: 05-05-1956 67 y.o. PCP: System, Provider Not In  Date of Admission: 01/24/2023 10:36 PM Date of Discharge: 02/11/2023 Attending Physician: Dr. Sol Blazing  Discharge Diagnosis: Principal Problem:   Bacteremia due to Escherichia coli Active Problems:   Constipation   Dementia with behavioral disturbance (HCC)   S/p left hip fracture   Urinary retention   Normocytic anemia   Nondisplaced fracture of distal phalanx of right great toe, initial encounter for closed fracture   Bacteremia due to Gram-negative bacteria   Altered mental status    Discharge Medications: Allergies as of 02/11/2023       Reactions   Prozac [fluoxetine Hcl]    SSRIs in general cause worsened depression and suicidal ideation Reaction listed per [facility] *anaphylaxis*        Medication List     STOP taking these medications    aspirin EC 81 MG tablet   atorvastatin 20 MG tablet Commonly known as: LIPITOR   clonazePAM 0.5 MG tablet Commonly known as: KlonoPIN   HYDROcodone-acetaminophen 5-325 MG tablet Commonly known as: NORCO/VICODIN       TAKE these medications    feeding supplement Liqd Take 237 mLs by mouth 2 (two) times daily between meals.   multivitamin with minerals Tabs tablet Take 1 tablet by mouth daily.   mupirocin cream 2 % Commonly known as: BACTROBAN Apply topically daily.   neomycin-bacitracin-polymyxin [phone] ointment Apply 1 Application topically as needed (for minor skin tears or abrasions).   polyethylene glycol powder 17 GM/SCOOP powder Commonly known as: GLYCOLAX/MIRALAX Take 1 capful (17 g) by mouth daily as needed for mild constipation.   senna 8.6 MG Tabs tablet Commonly known as: SENOKOT Take 1 tablet (8.6 mg total) by mouth daily as needed for mild constipation (use daily if taking opioids).   Super Omega 3 EPA/DHA 1000 MG Caps Take 2 capsules by mouth at bedtime.       ASK your doctor about these medications     acetaminophen 325 MG tablet Commonly known as: TYLENOL Take 2 tablets (650 mg total) by mouth every 6 (six) hours as needed for moderate pain. Ask about: Should I take this medication?               Discharge Care Instructions  (From admission, onward)           Start     Ordered   02/11/23 0000  Discharge wound care:       Comments: 1. Apply Bactroban to right great toe wound Q day, then cover with gauze and tape 2. Apply Xeroform gauze and ABD pad to left posterior calf and ankle Q day, then cover with kerlex   02/11/23 1008   01/31/23 0000  Discharge wound care:       Comments: 1. Apply Bactroban to right great toe wound Q day, then cover with gauze and tape 2. Apply Xeroform gauze and ABD pad to left posterior calf and ankle Q day, then cover with kerlex   01/31/23 1114            Disposition and follow-up:   Mr.Aylen E Seubert was discharged from [facility] in Stable condition and will be discharged home with hospice.  At the hospital follow up visit please address:  1.  Follow-up: a.  He was treated for ESBL bacteremia secondary to UTI and has now has urinary retention.  He will be discharged with Foley in place.  Might  consider maintaining chronic Foley for comfort, but can also consider removing if he is able to pass trials of void.  b.  Patient has history of advanced Alzheimer's dementia with physical deconditioning.  He is on a dysphagia 1 diet with limited p.o. intake.  He has baseline is alert and oriented x 1 (to self) but he does wax and wane as well.  He is DNR but not comfort care. He has not required any PRNs for agitation, pain, etc.  c.  He has a right big toe fracture which will be managed conservatively.  Has only required Tylenol for pain.  Ice and wound care   2.  Labs / imaging needed at time of follow-up: none  3.  Pending labs/ test needing follow-up: none  Follow-up Appointments:   Hospital Course by problem  list: AVYAN PIRRI is a 67 yo person living at memory care for Alzheimer's dementia who was admitted to the IMTS at [facility] [address] for acute encephalopathy, treated for sepsis secondary to  ESBL bacteremia.  Goals of care were discussed extensively during the admission.  Unfortunately, the patient could not be discharged back to his memory unit with his Foley catheter in place.  After looking for other placement options, the family has decided to take him home with hospice. He is DNR but not full comfort care.    Transition to hospice care Alzheimer's dementia Physical deconditioning Patient is only oriented to self and has limited physical mobility with severe deconditioning at baseline. Palliative care was consulted during this admission given patient's functional decline.an repeated hospital admissions in the past 3 months. After GOC discussions, the family decided to transition him to hospice care. Initially, plan was to dc back to his memory care unit, but unfortunately he could not return with urinary catheter in place. After considering other options, the family decided to take him home with hospice.  He is currently DNR with hospice but not comfort care. He has not required any PRNs for agitation, pain, dyspnea, etc. Of note, he also has limited p.o. intake and nutrition is supplemented with ensures. He required assistance and encouragement with feeding. He has a dysphagia 1 diet. He will continue to be follow by hospice in the outpatient setting.  Sepsis ESBL E coli bacteremia from UTI Bladder stretch injury Patient presented from [facility] with acute encephalopathy, tachycardic, and hypotensive admitted for sepsis.on 5/1.Patient's bladder ultrasound  revealed severely distended bladder on admission, with >1L of urine output after foley insertion.  U/A and blood cultures grew ESBL E coli within 24 hours of admission and patient was started on meropenem. Patient level of alertness and  interaction improved while on therapy. Patient 's infection was treated with Meropenem for a total of 6 days of therapy (ertapenem on day 7). He failed two trials of void after completing antibiotics and will be discharging with foley in place. Can consider further trials of void and foley removal if desired in the outpatient setting.  First distal non displaced great toe phalangeal fracture Soft tissue edema Unknown mechanism per [facility] staff. This was treated conservatively during this admission as patient's physical mobility was not limited by this injury. Continue wound care at discharge.   S/p Hemiarthroplasty 01/01/2023 Post surgical hematoma vs seroma on proximal L femoral metaphysis  Patient was recently discharged from IMTS service after a hemiarthroplasty on 4/7. Patient's surgical site was scanned during admission for sepsis and surgical site infection was suspected. Imaging revealed a small fluid  collection on proximal posterior L femoral metaphysis likely in the setting of a hematoma vs seroma. IT drainage was deferred as there was no acute tenderness and alternate infection site was identified. Patient's hemoglobin was monitored and stable during this admission.  No point tenderness or signs of surgical site infection was noted otherwise  Subjective: Patient is pleasantly demented and mildly confused today. Oriented only to self. Similar to baseline.  Does not seem to be in any pain or discomfort.  No family at bedside. Called his wife to discuss discharge planning and they have all necessary equipment at home. They are amenable to dc today.  Discharge Vitals:   BP 123/64 (BP Location: Right Arm)   Pulse (!) 55   Temp 98 F (36.7 C) (Oral)   Resp 16   Ht 6' (1.829 m)   Wt 62.2 kg   SpO2 99%   BMI 18.60 kg/m  Discharge exam: General: NAD CV: RRR. No murmurs, rubs, or gallops. No LE edema Abdominal: Soft, nontender, nondistended. Normoactive bowel sounds. Foley in  place Skin: Warm and dry. No obvious rash or lesions. Extremities: R 1st digit covered with dressing, cdi Neuro: Alert and oriented x 1.  Pleasantly demented.  Awake and interacts.    Pertinent Labs, Studies, and Procedures:     Latest Ref Rng & Units 02/08/2023   11:43 AM 01/27/2023   12:49 AM 01/26/2023    4:14 AM  CBC  WBC 4.0 - 10.5 K/uL 9.2  7.4  9.1   Hemoglobin 13.0 - 17.0 g/dL 29.5  9.2  8.8   Hematocrit 39.0 - 52.0 % 37.7  28.8  27.1   Platelets 150 - 400 K/uL 492  152  159        Latest Ref Rng & Units 02/08/2023   11:43 AM 01/27/2023   12:49 AM 01/26/2023    4:14 AM  CMP  Glucose 70 - 99 mg/dL 284  132  440   BUN 8 - 23 mg/dL 12  11  7    Creatinine 0.61 - 1.24 mg/dL 1.02  7.25  3.66   Sodium 135 - 145 mmol/L 140  141  138   Potassium 3.5 - 5.1 mmol/L 3.7  3.8  2.8   Chloride 98 - 111 mmol/L 103  108  105   CO2 22 - 32 mmol/L 27  26  26    Calcium 8.9 - 10.3 mg/dL 8.9  7.8  7.8     CT ABDOMEN PELVIS W CONTRAST  Result Date: 01/25/2023 CLINICAL DATA:  Abdominal pain. Tenderness to palpation. Baseline dementia. EXAM: CT ABDOMEN AND PELVIS WITH CONTRAST TECHNIQUE: Multidetector CT imaging of the abdomen and pelvis was performed using the standard protocol following bolus administration of intravenous contrast. RADIATION DOSE REDUCTION: This exam was performed according to the departmental dose-optimization program which includes automated exposure control, adjustment of the mA and/or kV according to patient size and/or use of iterative reconstruction technique. CONTRAST:  75mL OMNIPAQUE IOHEXOL 350 MG/ML SOLN COMPARISON:  CT without contrast 08/09/2021, CT with contrast 08/08/2021 FINDINGS: Lower chest: Lung bases are clear with mild chronic elevation right diaphragm. The cardiac size is normal. There are scattered calcifications in the left main and LAD coronary artery Hepatobiliary: The liver is mildly steatotic. No mass is seen. Unremarkable gallbladder and bile ducts. Pancreas:  No abnormality. Spleen: No abnormality.  No splenomegaly. Adrenals/Urinary Tract: There is no adrenal or renal cortical mass enhancement. No intrarenal stone is seen. There is symmetric mild bilateral hydroureteronephrosis extending  to the bladder, which itself is moderately distended with the dome reaching as high as the L4-5 level. No focal wall thickening is evident, with partial obscuration of the left posterolateral bladder wall by spray artifact from a left hip arthroplasty. The visualized bladder wall is unremarkable. Stomach/Bowel: The stomach is contracted. The small bowel is normal caliber. The appendix is unremarkable. There is moderate retained stool in the ascending colon and in the rectum. There are scattered diverticulosis without evidence of diverticulitis. Vascular/Lymphatic: Aortic atherosclerosis. No enlarged abdominal or pelvic lymph nodes. Reproductive: Mild prostatomegaly with transverse axis 4.7 cm. Other: There are no incarcerated hernias. There is no free air, free hemorrhage or free fluid. Musculoskeletal: There is mild edema in the upper thighs. There is a left hip arthroplasty which was placed 01/01/2023 according to Epic notes. There is a hypodense collection posterior to proximal left femoral metaphysis measuring 6.0 x 2.8 x 5.1 cm, with scattered calcifications along its periphery. This is probably a hematoma as it measures 40-50 Hounsfield units. Infectious complication not strictly excluded. There is mild osteopenia with degenerative changes spine. No regional skeletal fractures. Bridging osteophytes anterior right SI joint. No destructive bone lesions. IMPRESSION: 1. Moderate bladder distention with symmetric mild bilateral hydroureteronephrosis. No stone disease. Findings could be due to bladder outlet obstruction or neurogenic bladder. Correlate clinically for cystitis. 2. Mild prostatomegaly. 3. Constipation and diverticulosis. 4. Aortic and coronary artery atherosclerosis. 5.  Left hip arthroplasty with 6.0 x 2.8 x 5.1 cm hypodense collection posterior to the proximal left femoral metaphysis, probably a hematoma. Infectious complication not strictly excluded. 6. Mild hepatic steatosis. Aortic Atherosclerosis (ICD10-I70.0). Electronically Signed   By: Almira Bar M.D.   On: 01/25/2023 03:15   DG Toe Great Right  Result Date: 01/25/2023 CLINICAL DATA:  Recent trauma with first toe pain, initial encounter EXAM: RIGHT GREAT TOE COMPARISON:  01/24/2023 FINDINGS: Fracture of the first distal phalanx is noted. Mild soft tissue swelling is seen. No other focal abnormality is noted. IMPRESSION: First distal phalangeal fracture with evidence of soft tissue swelling. Electronically Signed   By: Alcide Clever M.D.   On: 01/25/2023 02:12   DG Hip Unilat W or Wo Pelvis 2-3 Views Left  Result Date: 01/25/2023 CLINICAL DATA:  Possible sepsis EXAM: DG HIP (WITH OR WITHOUT PELVIS) 2-3V LEFT COMPARISON:  12/30/2022 FINDINGS: Left hip replacement with intact hardware and normal alignment. No acute fracture. Pubic symphysis and rami appear intact. SI joints are non widened. Edema within the soft tissues overlying the left hip IMPRESSION: Left hip replacement. No acute osseous abnormality. Electronically Signed   By: Jasmine Pang M.D.   On: 01/25/2023 00:05   DG Foot Complete Right  Result Date: 01/25/2023 CLINICAL DATA:  Pain EXAM: RIGHT FOOT COMPLETE - 3+ VIEW COMPARISON:  None Available. FINDINGS: Limited by technique and position. No gross fracture or malalignment. Degenerative changes of the IP joints. Diffuse soft tissue swelling. Vascular calcifications. IMPRESSION: Limited by technique and position. No gross acute osseous abnormality. Electronically Signed   By: Jasmine Pang M.D.   On: 01/25/2023 00:02   DG Chest Port 1 View  Result Date: 01/24/2023 CLINICAL DATA:  Possible sepsis EXAM: PORTABLE CHEST 1 VIEW COMPARISON:  12/30/2022 FINDINGS: No acute airspace disease or effusion.  Cardiomediastinal silhouette within normal limits. Aortic atherosclerosis. IMPRESSION: No active disease. Electronically Signed   By: Jasmine Pang M.D.   On: 01/24/2023 23:59     Discharge Instructions: Discharge Instructions     Call MD for:  persistant nausea and vomiting   Complete by: As directed    Call MD for:  redness, tenderness, or signs of infection (pain, swelling, redness, odor or green/yellow discharge around incision site)   Complete by: As directed    Call MD for:  severe uncontrolled pain   Complete by: As directed    Call MD for:  temperature >100.4   Complete by: As directed    Diet - low sodium heart healthy   Complete by: As directed    Discharge instructions   Complete by: As directed    1.  He was treated for ESBL bacteremia with urine as likely source.  He also had chronic urinary retention.  He will be discharged with Foley in place.  Foley should remain in for at least 1 more week and then can consider removing if he is passing voiding trials.  Might consider maintaining chronic Foley for comfort. 2.  Patient has history of advanced Alzheimer's dementia with physical deconditioning.  He is on a dysphagia 1 diet with limited p.o. intake.  He has baseline is alert and oriented x 1 (to self) but he does wax and wane as well.  He is DNR but not comfort care 3.  He has a right big toe fracture which will be managed conservatively.  Can be managed with Tylenol and will also prescribe oxycodone temporarily for breakthrough pain.  Ice and wound care as well.   Discharge wound care:   Complete by: As directed    1. Apply Bactroban to right great toe wound Q day, then cover with gauze and tape 2. Apply Xeroform gauze and ABD pad to left posterior calf and ankle Q day, then cover with kerlex   Increase activity slowly   Complete by: As directed        Discharge Instructions   None   Updated instructions (02/11/23):  Mr. Schellin was admitted with an infection and was  treated for 7 days with IV antibiotics.  He has no more signs of infection at this time.   1.  He does still have trouble urinating by himself.  It might be easier for him to keep the Foley catheter in place.  This could change in the future if he is able to get it removed and start urinating by himself.  2.  He did not require any strong pain medicines for his toe fracture, just occasional Tylenol.  Please see the wound care instructions and keep his wound clean and dry.  Please also continue monitoring for sacral ulcers.   Signed: [name], MD 02/11/2023, 10:21 AM   Pager: [phone]

## 2023-02-01 NOTE — TOC Progression Note (Signed)
Transition of Care [facility]) - Progression Note    Patient Details  Name: Paul Klein MRN: [id number] Date of Birth: 15-May-1956  Transition of Care [facility]) CM/SW Contact  Tom-Johnson, Hershal Coria, RN Phone Number: 02/01/2023, 3:37 PM  Clinical Narrative:     CM consulted for Residential Hospice. CM spoke with wife Paul Klein at  [phone]) Dianne informed CM that patient is active with [facility] services at [facility] and understands that they will not accept him for Residential Hospice as he does not meet their criteria. Dianne voiced concern about facility out of her home area. CM gave [employer] of Hospice services and requested [facility] [place]. CM called [facility] [place] and spoke with [place]. Drenda Freeze accepted referral and states she will get with family and update CM.  15:26- CM received a call from [facility] with [facility] [place] stating that patient was denied approval for Residential Hospice as he does not have symptom burden to treat for and patient is not on any IV meds. States they will follow closely to see if any changes. MD and wife notified.     CM will continue to follow and render compassionate care at this time.    Expected Discharge Plan: Hospice Medical Facility Barriers to Discharge: Barriers Resolved  Expected Discharge Plan and Services In-house Referral: Clinical Social Work   Post Acute Care Choice: Residential Hospice Bed Living arrangements for the past 2 months: Assisted Living Facility Expected Discharge Date: 01/31/23                           [facility] Agency: [facility] (Residential Hospice) Date [facility] Agency Contacted: 02/01/23 Time HH Agency Contacted: 1145 Representative spoke with at [facility] Agency: Drenda Freeze   Social Determinants of Health (SDOH) Interventions SDOH Screenings   Depression (PHQ2-9): Low Risk  (06/02/2021)  Tobacco Use: Medium Risk (01/03/2023)    Readmission Risk Interventions     No data to  display

## 2023-02-01 NOTE — Progress Notes (Signed)
[facility] 5M03 [facility]) Hospital Liaison Note  Noted patient d/c cancelled 5.7 due to insertion of foley for retention, facility unable to accept patient with foley.  Will follow for discharge disposition.  Please call with any hospice related questions or concerns.  Doreatha Martin, RN, BSN [facility] Liaison [phone]

## 2023-02-01 NOTE — Progress Notes (Signed)
Patient initially intended to be discharged on 5/7.  Unfortunately, his memory care unit will not accept him with a Foley catheter.  They also would not allow the hospice agency to manage his Foley or perform in and outs/bladder scans at their facility.  Discharge was canceled and he started trial of void.  He is stable and appears comfortable on assessment.

## 2023-02-01 NOTE — Progress Notes (Addendum)
Daily Progress Note   Patient Name: Paul Klein       Date: 02/01/2023 DOB: April 01, 1956  Age: 67 y.o. MRN#: [id number] Attending Physician: Ginnie Smart, MD Primary Care Physician: System, Provider Not In Admit Date: 01/24/2023 Length of Stay: 6 days  Reason for Consultation/Follow-up: Establishing goals of care  HPI/Patient Profile:  67 y.o. male  with past medical history of Alzheimer's dementia, recurrent falls and recent left proximal femur fx s/p L hemiarthroplasty in April, who presented with worsening encephalopathy and admitted for ESBL bacteremia from UTI.  He was admitted on 01/24/2023 with ESBL bacteremia secondary to UTI, Alzheimer's dementia, physical deconditioning, and others.    PMT was consulted for GOC conversations.  Subjective:   Subjective: Chart Reviewed. Updates received. Patient Assessed. Created space and opportunity for patient  and family to explore thoughts and feelings regarding current medical situation.  Today's Discussion: Today saw the patient at bedside, no family was present.  I spoke with the patient's nurse and communicated with the medical team.  It appears the patient will require a Foley catheter.  This precludes him from returning to his memory care unit at [facility] house.  TOC and medical team are currently exploring various options.  Possibly starting the patient on an alpha-blocker to see if this helps with his urinary retention and negates the need for catheterization.  At this point goals are clear.  We are now facing a disposition issue.  ROS Limited due to Mental Status Review of Systems  Constitutional:        Denies pain in general    Objective:   Vital Signs:  BP 127/62 (BP Location: Left Arm)   Pulse 61   Temp 98.1 F (36.7 C)   Resp 17   Ht 6' (1.829 m)   Wt 72 kg   SpO2 95%   BMI 21.53 kg/m   Physical Exam: Physical Exam Vitals and nursing note reviewed.  Constitutional:      General: He is not in acute  distress.    Comments: Eating with nurse tech actively feeding him when I entered, not taking in fluids  HENT:     Head: Normocephalic and atraumatic.  Pulmonary:     Effort: Pulmonary effort is normal. No respiratory distress.  Abdominal:     General: Abdomen is flat.  Neurological:     Mental Status: He is alert. He is disoriented and confused.  Psychiatric:        Mood and Affect: Mood normal.        Behavior: Behavior normal.     Palliative Assessment/Data: 20-30%    Existing Vynca/ACP Documentation: Advanced directive signed 05/12/2021  Assessment & Plan:   Impression: Present on Admission:  (Resolved) Sepsis (HCC)  Bacteremia due to Escherichia coli  Dementia with behavioral disturbance (HCC)  Constipation  Urinary retention  Normocytic anemia  Nondisplaced fracture of distal phalanx of right great toe, initial encounter for closed fracture  Bacteremia due to Gram-negative bacteria  SUMMARY OF RECOMMENDATIONS   DNR Awaiting response from hospice on possible residential placement Goals are clear at this time PMT will back off for now and chart check Please let us know for any significant clinical change or new palliative needs  Symptom Management:  Per primary team PMT is available to assist as needed  Code Status: DNR  Prognosis: Unable to determine  Discharge Planning: To Be Determined  Discussed with: Medical team, nursing team, [facility] team  Thank you for allowing [name] to  participate in the care of Paul Klein PMT will continue to support holistically.  Billing based on MDM: Moderate   Wynne Dust, NP Palliative Medicine Team  Team Phone # [phone] (Nights/Weekends)  05/25/2021, 8:17 AM

## 2023-02-02 DIAGNOSIS — N390 Urinary tract infection, site not specified: Secondary | ICD-10-CM | POA: Diagnosis not present

## 2023-02-02 DIAGNOSIS — G309 Alzheimer's disease, unspecified: Secondary | ICD-10-CM | POA: Diagnosis not present

## 2023-02-02 DIAGNOSIS — B9620 Unspecified Escherichia coli [E. coli] as the cause of diseases classified elsewhere: Secondary | ICD-10-CM | POA: Diagnosis not present

## 2023-02-02 DIAGNOSIS — R7881 Bacteremia: Secondary | ICD-10-CM | POA: Diagnosis not present

## 2023-02-02 LAB — GLUCOSE, CAPILLARY: Glucose-Capillary: 114 mg/dL — ABNORMAL HIGH (ref 70–99)

## 2023-02-02 MED ORDER — LIDOCAINE HCL URETHRAL/MUCOSAL 2 % EX GEL
1.0000 | Freq: Once | CUTANEOUS | Status: DC
  Filled 2023-02-02: qty 6

## 2023-02-02 NOTE — Progress Notes (Signed)
[facility] 5M03 [facility]) Hospital Liaison Note  Rounded on patient who is alert and appears comfortable.  Bedside RN reports foley has been removed for voiding trial.  She will perform bladder scan in 6 hours to see if foley can stay out.  If patient can void, tentative discharge plans for tomorrow according to RN.  ACC will continue to follow for discharge planning.  Doreatha Martin, RN, BSN [facility] Liaison [phone]

## 2023-02-02 NOTE — Progress Notes (Signed)
Subjective:   Summary: Paul Klein is a 67 y.o. year old male currently admitted on the IMTS HD#7 for ESBL bacteremia 2/2 UTI.  Overnight Events:  Foley in place, will discuss placement options with CM, palliative, and family.  Vital stable overnight.  He is alert and oriented x 0 and appears comfortable at time of assessment this morning.  Will go ahead with trial of void today.  Objective:  Vital signs in last 24 hours: Vitals:   01/31/23 2143 02/01/23 0922 02/01/23 1743 02/01/23 2211  BP: 118/61 127/62 115/64 119/67  Pulse: 60 61 63 64  Resp: 19 17 17 18   Temp: 98.1 F (36.7 C) 98.1 F (36.7 C) 98.4 F (36.9 C) 98.4 F (36.9 C)  TempSrc: Oral   Oral  SpO2: 97% 95% 94% 96%  Weight:      Height:       Supplemental O2: Room Air SpO2: 96 %   Physical Exam:  Constitutional: Elderly appearing, NAD, appears comfortable Cardiovascular: RRR, no murmurs, rubs or gallops Abdominal: soft, non-tender, non-distended Skin: warm and dry Neuro: Alert and oriented x 1   Filed Weights   01/24/23 2329  Weight: 72 kg     Intake/Output Summary (Last 24 hours) at 02/02/2023 0618 Last data filed at 02/01/2023 2212 Gross per 24 hour  Intake 474 ml  Output 1550 ml  Net -1076 ml    Net IO Since Admission: -1,245.84 mL [02/02/23 0618]  Pertinent Labs:    Latest Ref Rng & Units 01/27/2023   12:49 AM 01/26/2023    4:14 AM 01/25/2023    5:23 AM  CBC  WBC 4.0 - 10.5 K/uL 7.4  9.1  10.8   Hemoglobin 13.0 - 17.0 g/dL 9.2  8.8  16.1   Hematocrit 39.0 - 52.0 % 28.8  27.1  32.0   Platelets 150 - 400 K/uL 152  159  251        Latest Ref Rng & Units 01/27/2023   12:49 AM 01/26/2023    4:14 AM 01/25/2023    5:23 AM  CMP  Glucose 70 - 99 mg/dL 096  045  409   BUN 8 - 23 mg/dL 11  7  9    Creatinine 0.61 - 1.24 mg/dL 8.11  9.14  7.82   Sodium 135 - 145 mmol/L 141  138  136   Potassium 3.5 - 5.1 mmol/L 3.8  2.8  3.5   Chloride 98 - 111 mmol/L 108  105  100   CO2 22 - 32  mmol/L 26  26  23    Calcium 8.9 - 10.3 mg/dL 7.8  7.8  8.7   Total Protein 6.5 - 8.1 g/dL   5.5   Total Bilirubin 0.3 - 1.2 mg/dL   1.1   Alkaline Phos 38 - 126 U/L   112   AST 15 - 41 U/L   20   ALT 0 - 44 U/L   10    Imaging:   EKG:   Assessment/Plan:   Principal Problem:   Bacteremia due to Escherichia coli Active Problems:   Constipation   Dementia with behavioral disturbance (HCC)   S/p left hip fracture   Urinary retention   Normocytic anemia   Nondisplaced fracture of distal phalanx of right great toe, initial encounter for closed fracture   Bacteremia due to Gram-negative bacteria   Altered mental status   Patient  Summary: Paul Klein is a 67 y.o. with a pertinent PMH of Alzheimer's dementia, recurrent falls and recent left proximal femur fx s/p L hemiarthroplasty in April, who presented with worsening encephalopathy and admitted for ESBL bacteremia from UTI   ESBL E coli bacteremia 2/2 UTI Bladder stretch injury Chronic urinary retention Vitals remained stable.  He completed course of antibiotics.  Flomax started yesterday, will repeat trial of void today.. -Trial of void with every 6 hour bladder scans. - Start Flomax today, repeat trial of void tomorrow   Alzheimer's dementia Physical deconditioning The patient continues to be interactive, however, he is disoriented and is pleasantly confused.  Unfortunately cannot be discharged back to memory unit while he has a Foley.  He is currently not a candidate for inpatient hospice.  If he succeeds trial of void , he could possibly go back to his memory unit without catheter in place.  Otherwise we will have to look into other options. - Delirium precautions - Appreciate TOC, case management options - Dysphagia 1 diet   First distal non displaced great toe phalangeal fracture Soft tissue edema Unknown mechanism per [facility] staff. Will continue conservative management during hospital stay and following  discharge - Tylenol 1000 mg TID - Oxycodone 5 mg for breakthrough pain - Ice PRN -Wound care  Diet:  Dysphagia 1 IVF: None,None VTE: Enoxaparin Code: DNR, full scope care.  Hospice after discharge PT/OT recs: N/A TOC recs: Order placed for hospice placement at his memory care unit in [place] [facility] Family Update:   Dispo: Anticipated discharge pending trial of void versus other disposition options.   [name], MD PGY-1 Internal Medicine Resident Please contact the on call pager after 5 pm and on weekends at [phone].

## 2023-02-03 DIAGNOSIS — G309 Alzheimer's disease, unspecified: Secondary | ICD-10-CM | POA: Diagnosis not present

## 2023-02-03 DIAGNOSIS — N390 Urinary tract infection, site not specified: Secondary | ICD-10-CM | POA: Diagnosis not present

## 2023-02-03 DIAGNOSIS — B9620 Unspecified Escherichia coli [E. coli] as the cause of diseases classified elsewhere: Secondary | ICD-10-CM | POA: Diagnosis not present

## 2023-02-03 DIAGNOSIS — R7881 Bacteremia: Secondary | ICD-10-CM | POA: Diagnosis not present

## 2023-02-03 LAB — GLUCOSE, CAPILLARY: Glucose-Capillary: 107 mg/dL — ABNORMAL HIGH (ref 70–99)

## 2023-02-03 NOTE — Progress Notes (Signed)
Subjective:   Summary: Paul Klein is a 67 y.o. year old male currently admitted on the IMTS HD#8 for ESBL bacteremia 2/2 UTI.  Overnight Events:  Repeated trial of void yesterday after initiating Flomax day before.  First bladder scan showed 400 cc.  Attempted In-N-Out catheterization but nursing staff unable to place due to resistance.  Coud catheter placed.  Remains comfortable and asymptomatic.  Alert and oriented x 2 today.  Objective:  Vital signs in last 24 hours: Vitals:   02/02/23 0919 02/02/23 1639 02/02/23 2000 02/03/23 0500  BP: 137/64 136/67 (!) 147/76 (!) 117/94  Pulse: (!) 57 64 63 60  Resp: 17 20 19 17   Temp: 98.4 F (36.9 C) 98 F (36.7 C) 98.9 F (37.2 C) 98.2 F (36.8 C)  TempSrc: Oral Oral Oral Oral  SpO2: 97% 97% 96% 98%  Weight:      Height:       Supplemental O2: Room Air SpO2: 98 %   Physical Exam:  Constitutional: Elderly appearing, NAD, appears comfortable Cardiovascular: RRR, no murmurs, rubs or gallops Abdominal: soft, non-tender, non-distended Skin: warm and dry Neuro: Alert and oriented x 2   Filed Weights   01/24/23 2329  Weight: 72 kg     Intake/Output Summary (Last 24 hours) at 02/03/2023 0631 Last data filed at 02/03/2023 0503 Gross per 24 hour  Intake 180 ml  Output 1150 ml  Net -970 ml    Net IO Since Admission: -2,215.84 mL [02/03/23 0631]  Pertinent Labs:    Latest Ref Rng & Units 01/27/2023   12:49 AM 01/26/2023    4:14 AM 01/25/2023    5:23 AM  CBC  WBC 4.0 - 10.5 K/uL 7.4  9.1  10.8   Hemoglobin 13.0 - 17.0 g/dL 9.2  8.8  16.1   Hematocrit 39.0 - 52.0 % 28.8  27.1  32.0   Platelets 150 - 400 K/uL 152  159  251        Latest Ref Rng & Units 01/27/2023   12:49 AM 01/26/2023    4:14 AM 01/25/2023    5:23 AM  CMP  Glucose 70 - 99 mg/dL 096  045  409   BUN 8 - 23 mg/dL 11  7  9    Creatinine 0.61 - 1.24 mg/dL 8.11  9.14  7.82   Sodium 135 - 145 mmol/L 141  138  136   Potassium 3.5 - 5.1 mmol/L  3.8  2.8  3.5   Chloride 98 - 111 mmol/L 108  105  100   CO2 22 - 32 mmol/L 26  26  23    Calcium 8.9 - 10.3 mg/dL 7.8  7.8  8.7   Total Protein 6.5 - 8.1 g/dL   5.5   Total Bilirubin 0.3 - 1.2 mg/dL   1.1   Alkaline Phos 38 - 126 U/L   112   AST 15 - 41 U/L   20   ALT 0 - 44 U/L   10    Imaging:   EKG:   Assessment/Plan:   Principal Problem:   Bacteremia due to Escherichia coli Active Problems:   Constipation   Dementia with behavioral disturbance (HCC)   S/p left hip fracture   Urinary retention   Normocytic anemia   Nondisplaced fracture of distal phalanx of right great toe, initial encounter for closed fracture   Bacteremia due to Gram-negative bacteria  Altered mental status   Patient Summary: Paul Klein is a 67 y.o. with a pertinent PMH of Alzheimer's dementia, recurrent falls and recent left proximal femur fx s/p L hemiarthroplasty in April, who presented with worsening encephalopathy and admitted for ESBL bacteremia from UTI   ESBL E coli bacteremia 2/2 UTI Bladder stretch injury Chronic urinary retention Vitals remained stable.  He completed course of antibiotics.  Coud cath in place due to difficult and out yesterday.  Will likely need to remain in place at least for now. - Keep coud cath in place   Alzheimer's dementia Physical deconditioning Pleasantly demented.  Unfortunately cannot be discharged back to memory unit while he has a urinary catheter in place.  He is currently not a candidate for inpatient hospice.  Failed trial of void yesterday, will need to look at other options for placement. - Delirium precautions - PT/OT eval - Appreciate TOC, case management options - Dysphagia 1 diet   First distal non displaced great toe phalangeal fracture Soft tissue edema Unknown mechanism per [facility] staff. Will continue conservative management during hospital stay and following discharge - Tylenol 1000 mg TID - Oxycodone 5 mg for breakthrough  pain - Ice PRN -Wound care  Diet:  Dysphagia 1 IVF: None,None VTE: Enoxaparin Code: DNR, full scope care.  Hospice after discharge PT/OT recs: N/A TOC recs:  Family Update:   Dispo: Looking for disposition options  [name], MD PGY-1 Internal Medicine Resident Please contact the on call pager after 5 pm and on weekends at [phone].

## 2023-02-03 NOTE — Evaluation (Signed)
Occupational Therapy Evaluation Patient Details Name: Paul Klein MRN: [id number] DOB: 1956-09-15 Today's Date: 02/03/2023   History of Present Illness 67 y.o. male presenting from [facility] to [facility] ED 01/25/2023 with AMS and elevated temperature. Admitted with UTI; has been unable to void without catheterization. Recent admission for falls and resulting L hip hemiarthroplasty as well as L great toe fx. PMH significant for Alzheimer's, and anxiety.   Clinical Impression   PTA, pt recently at [facility] house and per chart receiving total care; of note, prior to hip fx, per chart, pt ambulatory. Upon eval, pt able to come to EOB with mod A and multimodal cues. Able to maintain static sitting 10+ minutes with supervision A. Pt following limited amount of commands, but pleasant throughout. Pt performing STS with significant +2 assist requiring external facilitation of weight shift as well as power up due to poor coordination, sequencing and posterior lean. Patient will benefit from continued inpatient follow up therapy, <3 hours/day      Recommendations for follow up therapy are one component of a multi-disciplinary discharge planning process, led by the attending physician.  Recommendations may be updated based on patient status, additional functional criteria and insurance authorization.   Assistance Recommended at Discharge Frequent or constant Supervision/Assistance  Patient can return home with the following Two people to help with walking and/or transfers;Two people to help with bathing/dressing/bathroom;Assistance with cooking/housework;Help with stairs or ramp for entrance;Assist for transportation;Direct supervision/assist for financial management;Direct supervision/assist for medications management;Assistance with feeding    Functional Status Assessment  Patient has had a recent decline in their functional status and demonstrates the ability to make significant improvements in function  in a reasonable and predictable amount of time.  Equipment Recommendations  Other (comment) (defer)    Recommendations for Other Services       Precautions / Restrictions Precautions Precautions: Fall Precaution Comments: Unclear in chart whether pt still has posterior hip precautions so maintained during session Restrictions Weight Bearing Restrictions: No LLE Weight Bearing: Weight bearing as tolerated      Mobility Bed Mobility Overal bed mobility: Needs Assistance Bed Mobility: Supine to Sit, Sit to Supine     Supine to sit: Mod assist Sit to supine: Total assist   General bed mobility comments: Mod A for sequencing, truncal elevation, and scooting hips toward EOB. Total A to return to supine; difficulty sequencing and visually locating pillow to understand where to lie down.    Transfers Overall transfer level: Needs assistance Equipment used: 2 person hand held assist, Rolling walker (2 wheels) Transfers: Sit to/from Stand Sit to Stand: Total assist, +2 safety/equipment, +2 physical assistance           General transfer comment: max-total A +2 due to pt wiith max difficulty following postural and weight shifting commands despite direct commands and goal directed commands. pt with posterior lean when attempting to initiate stand.      Balance Overall balance assessment: Needs assistance Sitting-balance support: Bilateral upper extremity supported, No upper extremity supported, Single extremity supported, Feet supported Sitting balance-Leahy Scale: Fair Sitting balance - Comments: fair statically; supervision A Postural control: Posterior lean Standing balance support: Bilateral upper extremity supported, During functional activity Standing balance-Leahy Scale: Zero Standing balance comment: significantly reliant on external support                           ADL either performed or assessed with clinical judgement   ADL Overall ADL's :  Needs  assistance/impaired     Grooming: Total assistance   Upper Body Bathing: Total assistance   Lower Body Bathing: Total assistance   Upper Body Dressing : Total assistance   Lower Body Dressing: Total assistance   Toilet Transfer: Total assistance   Toileting- Clothing Manipulation and Hygiene: Total assistance   Tub/ Shower Transfer: Total assistance   Functional mobility during ADLs: Total assistance General ADL Comments: Total A for all ADL due to inability to consistently follow commands     Vision   Additional Comments: limited assessment, but benefiting from therapistb getting into central visual field to initiate communication and eye contact. Initiates tracking but does not sustain. Appeared to be hallucinating during session speaking to individuals not present     Perception Perception Perception Tested?: No   Praxis Praxis Praxis tested?: Not tested    Pertinent Vitals/Pain Pain Assessment Pain Assessment: Faces Faces Pain Scale: Hurts little more Pain Location: lt hip Pain Descriptors / Indicators: Grimacing Pain Intervention(s): Limited activity within patient's tolerance, Monitored during session     Hand Dominance Right   Extremity/Trunk Assessment Upper Extremity Assessment Upper Extremity Assessment: Generalized weakness;Difficult to assess due to impaired cognition   Lower Extremity Assessment Lower Extremity Assessment: Defer to PT evaluation   Cervical / Trunk Assessment Cervical / Trunk Assessment: Kyphotic   Communication Communication Communication: Expressive difficulties   Cognition Arousal/Alertness: Awake/alert Behavior During Therapy: Flat affect Overall Cognitive Status: History of cognitive impairments - at baseline                                 General Comments: Intermittently able to follow very basic commands, requires significant cueing     General Comments  VSS    Exercises     Shoulder Instructions       Home Living Family/patient expects to be discharged to:: Skilled nursing facility                                 Additional Comments: [facility] for ~1.5 years (per chart )      Prior Functioning/Environment Prior Level of Function : Needs assist             Mobility Comments: per chart, was ambulatory prior to hip surgery, but has been total care since return to [facility] ADLs Comments: assist for all aspects of care        OT Problem List: Decreased strength;Decreased activity tolerance;Impaired balance (sitting and/or standing);Pain;Decreased cognition      OT Treatment/Interventions: Self-care/ADL training;DME and/or AE instruction;Therapeutic activities;Balance training;Patient/family education    OT Goals(Current goals can be found in the care plan section) Acute Rehab OT Goals Patient Stated Goal: none stated OT Goal Formulation: Patient unable to participate in goal setting Time For Goal Achievement: 02/17/23 Potential to Achieve Goals: Fair  OT Frequency: Min 1X/week    Co-evaluation              AM-PAC OT "6 Clicks" Daily Activity     Outcome Measure Help from another person eating meals?: Total Help from another person taking care of personal grooming?: Total Help from another person toileting, which includes using toliet, bedpan, or urinal?: Total Help from another person bathing (including washing, rinsing, drying)?: Total Help from another person to put on and taking off regular upper body clothing?: Total Help from another person to  put on and taking off regular lower body clothing?: Total 6 Click Score: 6   End of Session Equipment Utilized During Treatment: Gait belt;Rolling walker (2 wheels) Nurse Communication: Mobility status  Activity Tolerance: Other (comment) (limited by cognition) Patient left: in bed;with call bell/phone within reach;with bed alarm set  OT Visit Diagnosis: Other abnormalities of gait and  mobility (R26.89);Muscle weakness (generalized) (M62.81);Pain;History of falling (Z91.81);Other symptoms and signs involving cognitive function                Time: 1610-9604 OT Time Calculation (min): 27 min Charges:  OT General Charges $OT Visit: 1 Visit OT Evaluation $OT Eval Moderate Complexity: 1 Mod OT Treatments $Self Care/Home Management : 8-22 mins  Paul Klein, OTR/L [facility] Acute Rehabilitation Office: [phone]   Paul Klein 02/03/2023, 5:59 PM

## 2023-02-03 NOTE — Progress Notes (Signed)
[facility] 5M03 [facility]) Hospital Liaison Note  Noted patient failed voiding trial and foley reinserted.  [facility] will follow for discharge disposition to follow at LTC for hospice services.  Doreatha Martin, RN, BSN [facility] Liaison [phone]

## 2023-02-03 NOTE — Plan of Care (Signed)
  Problem: Education: Goal: Verbalization of understanding the information provided (i.e., activity precautions, restrictions, etc) will improve Outcome: Progressing Goal: Individualized Educational Video(s) Outcome: Progressing   Problem: Activity: Goal: Ability to ambulate and perform ADLs will improve Outcome: Progressing   Problem: Clinical Measurements: Goal: Postoperative complications will be avoided or minimized Outcome: Progressing   Problem: Self-Concept: Goal: Ability to maintain and perform role responsibilities to the fullest extent possible will improve Outcome: Progressing   Problem: Pain Management: Goal: Pain level will decrease Outcome: Progressing   Problem: Education: Goal: Knowledge of General Education information will improve Description: Including pain rating scale, medication(s)/side effects and non-pharmacologic comfort measures Outcome: Progressing   Problem: Health Behavior/Discharge Planning: Goal: Ability to manage health-related needs will improve Outcome: Progressing   Problem: Clinical Measurements: Goal: Ability to maintain clinical measurements within normal limits will improve Outcome: Progressing Goal: Will remain free from infection Outcome: Progressing Goal: Respiratory complications will improve Outcome: Progressing Goal: Cardiovascular complication will be avoided Outcome: Progressing   Problem: Activity: Goal: Risk for activity intolerance will decrease Outcome: Progressing   Problem: Nutrition: Goal: Adequate nutrition will be maintained Outcome: Progressing   Problem: Coping: Goal: Level of anxiety will decrease Outcome: Progressing   Problem: Elimination: Goal: Will not experience complications related to bowel motility Outcome: Progressing Goal: Will not experience complications related to urinary retention Outcome: Progressing   Problem: Pain Managment: Goal: General experience of comfort will improve Outcome:  Progressing   Problem: Safety: Goal: Ability to remain free from injury will improve Outcome: Progressing   Problem: Skin Integrity: Goal: Risk for impaired skin integrity will decrease Outcome: Progressing   

## 2023-02-04 DIAGNOSIS — B9620 Unspecified Escherichia coli [E. coli] as the cause of diseases classified elsewhere: Secondary | ICD-10-CM | POA: Diagnosis not present

## 2023-02-04 DIAGNOSIS — R339 Retention of urine, unspecified: Secondary | ICD-10-CM | POA: Diagnosis not present

## 2023-02-04 DIAGNOSIS — R7881 Bacteremia: Secondary | ICD-10-CM | POA: Diagnosis not present

## 2023-02-04 DIAGNOSIS — F03918 Unspecified dementia, unspecified severity, with other behavioral disturbance: Secondary | ICD-10-CM

## 2023-02-04 LAB — GLUCOSE, CAPILLARY: Glucose-Capillary: 111 mg/dL — ABNORMAL HIGH (ref 70–99)

## 2023-02-04 NOTE — Plan of Care (Signed)
  Problem: Education: Goal: Verbalization of understanding the information provided (i.e., activity precautions, restrictions, etc) will improve Outcome: Progressing Goal: Individualized Educational Video(s) Outcome: Progressing   Problem: Activity: Goal: Ability to ambulate and perform ADLs will improve Outcome: Progressing   Problem: Clinical Measurements: Goal: Postoperative complications will be avoided or minimized Outcome: Progressing   Problem: Self-Concept: Goal: Ability to maintain and perform role responsibilities to the fullest extent possible will improve Outcome: Progressing   Problem: Pain Management: Goal: Pain level will decrease Outcome: Progressing   Problem: Education: Goal: Knowledge of General Education information will improve Description: Including pain rating scale, medication(s)/side effects and non-pharmacologic comfort measures Outcome: Progressing   Problem: Health Behavior/Discharge Planning: Goal: Ability to manage health-related needs will improve Outcome: Progressing   Problem: Clinical Measurements: Goal: Ability to maintain clinical measurements within normal limits will improve Outcome: Progressing Goal: Will remain free from infection Outcome: Progressing Goal: Respiratory complications will improve Outcome: Progressing Goal: Cardiovascular complication will be avoided Outcome: Progressing   Problem: Activity: Goal: Risk for activity intolerance will decrease Outcome: Progressing   Problem: Nutrition: Goal: Adequate nutrition will be maintained Outcome: Progressing   Problem: Coping: Goal: Level of anxiety will decrease Outcome: Progressing   Problem: Elimination: Goal: Will not experience complications related to bowel motility Outcome: Progressing Goal: Will not experience complications related to urinary retention Outcome: Progressing   Problem: Pain Managment: Goal: General experience of comfort will improve Outcome:  Progressing   Problem: Safety: Goal: Ability to remain free from injury will improve Outcome: Progressing   Problem: Skin Integrity: Goal: Risk for impaired skin integrity will decrease Outcome: Progressing

## 2023-02-04 NOTE — Progress Notes (Signed)
Subjective:   Summary: Paul Klein is a 67 y.o. year old male currently admitted on the IMTS HD#9 for ESBL bacteremia 2/2 UTI.  Overnight Events:  Still has good cath in place.  Appears comfortable, asymptomatic this morning.  Alert and oriented x 1.  OT recommended SNF yesterday, PT recommendation now is also SNF, will work with case management for placement options   Objective:  Vital signs in last 24 hours: Vitals:   02/03/23 0948 02/03/23 1628 02/03/23 2044 02/04/23 0508  BP: 112/86 128/62 (!) 142/66 (!) 149/71  Pulse: 66 64 (!) 54 63  Resp: 18 18 16    Temp: 98 F (36.7 C) 98.2 F (36.8 C) 98.1 F (36.7 C) 98.2 F (36.8 C)  TempSrc: Oral  Oral Oral  SpO2: 97%  99% 98%  Weight:      Height:       Supplemental O2: Room Air SpO2: 98 %   Physical Exam:  Constitutional: Elderly appearing, NAD, appears comfortable Cardiovascular: RRR, no murmurs, rubs or gallops Abdominal: soft, non-tender, non-distended Skin: warm and dry Neuro: Alert and oriented x 2   Filed Weights   01/24/23 2329  Weight: 72 kg     Intake/Output Summary (Last 24 hours) at 02/04/2023 0619 Last data filed at 02/04/2023 0600 Gross per 24 hour  Intake 240 ml  Output 1625 ml  Net -1385 ml    Net IO Since Admission: -3,600.84 mL [02/04/23 0619]  Pertinent Labs:    Latest Ref Rng & Units 01/27/2023   12:49 AM 01/26/2023    4:14 AM 01/25/2023    5:23 AM  CBC  WBC 4.0 - 10.5 K/uL 7.4  9.1  10.8   Hemoglobin 13.0 - 17.0 g/dL 9.2  8.8  24.4   Hematocrit 39.0 - 52.0 % 28.8  27.1  32.0   Platelets 150 - 400 K/uL 152  159  251        Latest Ref Rng & Units 01/27/2023   12:49 AM 01/26/2023    4:14 AM 01/25/2023    5:23 AM  CMP  Glucose 70 - 99 mg/dL 010  272  536   BUN 8 - 23 mg/dL 11  7  9    Creatinine 0.61 - 1.24 mg/dL 6.44  0.34  7.42   Sodium 135 - 145 mmol/L 141  138  136   Potassium 3.5 - 5.1 mmol/L 3.8  2.8  3.5   Chloride 98 - 111 mmol/L 108  105  100   CO2 22 - 32  mmol/L 26  26  23    Calcium 8.9 - 10.3 mg/dL 7.8  7.8  8.7   Total Protein 6.5 - 8.1 g/dL   5.5   Total Bilirubin 0.3 - 1.2 mg/dL   1.1   Alkaline Phos 38 - 126 U/L   112   AST 15 - 41 U/L   20   ALT 0 - 44 U/L   10    Imaging:   EKG:   Assessment/Plan:   Principal Problem:   Bacteremia due to Escherichia coli Active Problems:   Constipation   Dementia with behavioral disturbance (HCC)   S/p left hip fracture   Urinary retention   Normocytic anemia   Nondisplaced fracture of distal phalanx of right great toe, initial encounter for closed fracture   Bacteremia due to Gram-negative bacteria   Altered mental status   Patient Summary:  Paul Klein is a 67 y.o. with a pertinent PMH of Alzheimer's dementia, recurrent falls and recent left proximal femur fx s/p L hemiarthroplasty in April, who presented with worsening encephalopathy and admitted for ESBL bacteremia from UTI   ESBL E coli bacteremia 2/2 UTI Bladder stretch injury Chronic urinary retention Vitals remained stable.  He completed course of antibiotics.  Coud cath placed due to difficulty with in and out caths.  Will likely need to remain in place at least for now. - Keep coud cath in place   Alzheimer's dementia Physical deconditioning Pleasantly demented.  Unfortunately cannot be discharged back to memory unit while he has a urinary catheter in place.  He is currently not a candidate for inpatient hospice.  PT and OT have recommended SNF, will work with case management and family to consider option. - Delirium precautions - PT/OT eval - Appreciate TOC, case management options - Dysphagia 1 diet   First distal non displaced great toe phalangeal fracture Soft tissue edema Unknown mechanism per [facility] staff. Will continue conservative management during hospital stay and following discharge - Tylenol 1000 mg TID - Oxycodone 5 mg for breakthrough pain - Ice PRN -Wound care  Diet:  Dysphagia 1 IVF:  None,None VTE: Enoxaparin Code: DNR, full scope care.  Hospice after discharge PT/OT recs: SNF TOC recs:  Family Update:   Dispo: Looking for disposition options  [name], MD PGY-1 Internal Medicine Resident Please contact the on call pager after 5 pm and on weekends at [phone].

## 2023-02-04 NOTE — Evaluation (Signed)
Physical Therapy Evaluation Patient Details Name: Paul Klein MRN: [id number] DOB: 15-Apr-1956 Today's Date: 02/04/2023  History of Present Illness  67 y.o. male presenting from [facility] to [facility] ED 01/25/2023 with AMS and elevated temperature. Admitted with UTI; has been unable to void without catheterization. Recent admission for falls and resulting L hip hemiarthroplasty as well as L great toe fx. PMH significant for Alzheimer's, and anxiety.   Clinical Impression  Pt in bed upon arrival of PT, agreeable to evaluation at this time. Prior to admission the pt was receiving total care at memory care unit following recent hip hemiarthroplasty. The pt was inconsistent with yes/no answers today in session and demos poor initiation of movements or problem solving for mobility when cued. However, pt did impulsively return to supine from sitting EOB without any assist from therapist to any extremity and did reposition himself in bed. Demos good strength when resisting therapist, but poor functional use of extremities for mobility to command. Unable to achieve stand as pt became slightly restless and agitated with PT attempts to assist him to standing at EOB. Will continue to benefit from attempts to progress mobility and OOB transfers, will benefit from continued rehab after medically stable to d/c.       Recommendations for follow up therapy are one component of a multi-disciplinary discharge planning process, led by the attending physician.  Recommendations may be updated based on patient status, additional functional criteria and insurance authorization.  Follow Up Recommendations Can patient physically be transported by private vehicle: No     Assistance Recommended at Discharge Frequent or constant Supervision/Assistance  Patient can return home with the following  Two people to help with walking and/or transfers;Two people to help with bathing/dressing/bathroom;Direct supervision/assist for  financial management;Direct supervision/assist for medications management;Assist for transportation;Assistance with cooking/housework;Assistance with feeding;Help with stairs or ramp for entrance    Equipment Recommendations Other (comment) (defer to post acute, likely lift and WC)  Recommendations for Other Services       Functional Status Assessment Patient has had a recent decline in their functional status and/or demonstrates limited ability to make significant improvements in function in a reasonable and predictable amount of time     Precautions / Restrictions Precautions Precautions: Fall Precaution Comments: Unclear in chart whether pt still has posterior hip precautions so maintained during session Restrictions Weight Bearing Restrictions: No LLE Weight Bearing: Weight bearing as tolerated      Mobility  Bed Mobility Overal bed mobility: Needs Assistance Bed Mobility: Supine to Sit, Sit to Supine     Supine to sit: Max assist Sit to supine: Total assist   General bed mobility comments: pt needing increased time and assist to all extremities initially to come to sit, impulsively returned to supine without any assist. but then able to convince to come to sitting EOB with modA to manage LE.    Transfers                   General transfer comment: unable to generate hip clearance despite max-totalA, pt resisting and leaning posteriorly      Balance Overall balance assessment: Needs assistance Sitting-balance support: Bilateral upper extremity supported, No upper extremity supported, Single extremity supported, Feet supported Sitting balance-Leahy Scale: Fair Sitting balance - Comments: from poor to fair, improved with time from modA to minG Postural control: Posterior lean Standing balance support: Bilateral upper extremity supported, During functional activity Standing balance-Leahy Scale: Zero Standing balance comment: pt unable to achieve  stand at this  time, leaning posteriorly and resisting attempts at assist                             Pertinent Vitals/Pain Pain Assessment Pain Assessment: Faces Faces Pain Scale: Hurts little more Pain Location: L hip Pain Descriptors / Indicators: Grimacing Pain Intervention(s): Monitored during session, Limited activity within patient's tolerance, Repositioned    Home Living Family/patient expects to be discharged to:: Skilled nursing facility                   Additional Comments: [facility] for ~1.5 years (per chart )    Prior Function Prior Level of Function : Needs assist             Mobility Comments: per chart, was ambulatory prior to hip surgery, but has been total care since return to [facility] ADLs Comments: assist for all aspects of care     Hand Dominance   Dominant Hand: Right    Extremity/Trunk Assessment   Upper Extremity Assessment Upper Extremity Assessment: Overall WFL for tasks assessed (good strength when resisting therapist, not participating in MMT)    Lower Extremity Assessment Lower Extremity Assessment: Generalized weakness;Difficult to assess due to impaired cognition (not particiapting in MMT, good strength resisting therapist) LLE Deficits / Details: assessment limited by pt guarding/resisting movement    Cervical / Trunk Assessment Cervical / Trunk Assessment: Kyphotic  Communication   Communication: Expressive difficulties  Cognition Arousal/Alertness: Awake/alert Behavior During Therapy: Flat affect Overall Cognitive Status: History of cognitive impairments - at baseline                                 General Comments: Intermittently able to follow very basic commands, requires significant cueing. inconsistent with ye/no. became agitated with PT assist for mobility        General Comments General comments (skin integrity, edema, etc.): VSS on RA    Exercises     Assessment/Plan    PT  Assessment Patient needs continued PT services  PT Problem List Decreased strength;Decreased mobility;Decreased activity tolerance;Decreased balance;Decreased knowledge of use of DME;Pain;Decreased cognition;Decreased range of motion;Decreased coordination;Decreased safety awareness;Decreased knowledge of precautions       PT Treatment Interventions Therapeutic activities;DME instruction;Therapeutic exercise;Patient/family education;Balance training;Functional mobility training;Neuromuscular re-education;Gait training    PT Goals (Current goals can be found in the Care Plan section)  Acute Rehab PT Goals Patient Stated Goal: unable to state PT Goal Formulation: Patient unable to participate in goal setting Time For Goal Achievement: 02/18/23 Potential to Achieve Goals: Fair    Frequency Min 2X/week        AM-PAC PT "6 Clicks" Mobility  Outcome Measure Help needed turning from your back to your side while in a flat bed without using bedrails?: Total Help needed moving from lying on your back to sitting on the side of a flat bed without using bedrails?: Total Help needed moving to and from a bed to a chair (including a wheelchair)?: Total Help needed standing up from a chair using your arms (e.g., wheelchair or bedside chair)?: Total Help needed to walk in hospital room?: Total Help needed climbing 3-5 steps with a railing? : Total 6 Click Score: 6    End of Session Equipment Utilized During Treatment: Gait belt Activity Tolerance: Treatment limited secondary to agitation Patient left: in bed;with call bell/phone within reach;with  bed alarm set Nurse Communication: Other (comment) PT Visit Diagnosis: Other abnormalities of gait and mobility (R26.89);Unsteadiness on feet (R26.81);Pain;Muscle weakness (generalized) (M62.81) Pain - Right/Left: Left Pain - part of body: Hip    Time: 2956-2130 PT Time Calculation (min) (ACUTE ONLY): 20 min   Charges:   PT Evaluation $PT Eval Low  Complexity: 1 Low          Vickki Muff, PT, DPT   Acute Rehabilitation Department Office [phone] Secure Chat Communication Preferred  Ronnie Derby 02/04/2023, 9:09 AM

## 2023-02-04 NOTE — Progress Notes (Signed)
[facility]) Hospital Liaison Note   [facility] continues to follow for discharge disposition to follow at LTC for hospice services.   Eugenie Birks, MSW [facility] Liaison [phone]

## 2023-02-05 LAB — GLUCOSE, CAPILLARY: Glucose-Capillary: 131 mg/dL — ABNORMAL HIGH (ref 70–99)

## 2023-02-05 NOTE — Progress Notes (Signed)
Subjective:   Summary: Paul Klein is a 67 y.o. year old male currently admitted on the IMTS HD#11 for ESBL bacteremia 2/2 UTI.  Overnight Events: none  Patient assessed at bedside. He is pleasant, oriented to self only, at baseline.    Objective:  Vital signs in last 24 hours: Vitals:   02/04/23 1630 02/04/23 2029 02/05/23 0450 02/05/23 0938  BP: 127/68 124/64 136/68 139/71  Pulse: 66 68 (!) 50 (!) 55  Resp: 16 16 17 18   Temp: 98.4 F (36.9 C) 98.3 F (36.8 C) 98 F (36.7 C) 98 F (36.7 C)  TempSrc: Axillary Axillary Oral Oral  SpO2: 97% 98% 97% 99%  Weight:      Height:       Supplemental O2: Room Air SpO2: 99 %   Physical Exam:  Constitutional: Elderly appearing, NAD, appears comfortable Cardiovascular: RRR, no murmurs, rubs or gallops Abdominal: soft, non-tender, non-distended Skin: warm and dry Neuro: Alert and oriented x 2   Filed Weights   01/24/23 2329  Weight: 72 kg     Intake/Output Summary (Last 24 hours) at 02/05/2023 1221 Last data filed at 02/05/2023 1000 Gross per 24 hour  Intake 981 ml  Output 1325 ml  Net -344 ml   Net IO Since Admission: -3,707.84 mL [02/05/23 1221]  Pertinent Labs:    Latest Ref Rng & Units 01/27/2023   12:49 AM 01/26/2023    4:14 AM 01/25/2023    5:23 AM  CBC  WBC 4.0 - 10.5 K/uL 7.4  9.1  10.8   Hemoglobin 13.0 - 17.0 g/dL 9.2  8.8  16.1   Hematocrit 39.0 - 52.0 % 28.8  27.1  32.0   Platelets 150 - 400 K/uL 152  159  251        Latest Ref Rng & Units 01/27/2023   12:49 AM 01/26/2023    4:14 AM 01/25/2023    5:23 AM  CMP  Glucose 70 - 99 mg/dL 096  045  409   BUN 8 - 23 mg/dL 11  7  9    Creatinine 0.61 - 1.24 mg/dL 8.11  9.14  7.82   Sodium 135 - 145 mmol/L 141  138  136   Potassium 3.5 - 5.1 mmol/L 3.8  2.8  3.5   Chloride 98 - 111 mmol/L 108  105  100   CO2 22 - 32 mmol/L 26  26  23    Calcium 8.9 - 10.3 mg/dL 7.8  7.8  8.7   Total Protein 6.5 - 8.1 g/dL   5.5   Total Bilirubin 0.3 - 1.2  mg/dL   1.1   Alkaline Phos 38 - 126 U/L   112   AST 15 - 41 U/L   20   ALT 0 - 44 U/L   10    Assessment/Plan:   Principal Problem:   Bacteremia due to Escherichia coli Active Problems:   Constipation   Dementia with behavioral disturbance (HCC)   S/p left hip fracture   Urinary retention   Normocytic anemia   Nondisplaced fracture of distal phalanx of right great toe, initial encounter for closed fracture   Bacteremia due to Gram-negative bacteria   Altered mental status   Patient Summary: Paul Klein is a 67 y.o. with a pertinent PMH of Alzheimer's dementia, recurrent falls and recent left proximal femur fx s/p L hemiarthroplasty in April, who presented with  worsening encephalopathy and admitted for ESBL bacteremia from UTI.   ESBL E coli bacteremia 2/2 UTI Bladder stretch injury Chronic urinary retention Vitals remained stable.  He completed course of antibiotics.  Coud cath placed due to difficulty with in and out caths.  Will likely need to remain in place at least for now. - Keep coud cath in place   Alzheimer's dementia Physical deconditioning Pleasantly demented.  Unfortunately cannot be discharged back to memory unit while he has a urinary catheter in place.  He is currently not a candidate for inpatient hospice.  PT and OT have recommended SNF, will work with case management and family to consider option. - Delirium precautions - Appreciate TOC, case management options - Dysphagia 1 diet   First distal non displaced great toe phalangeal fracture Soft tissue edema Unknown mechanism per [name] staff. Will continue conservative management during hospital stay and following discharge - Tylenol 1000 mg TID - Oxycodone 5 mg for breakthrough pain - Ice PRN -Wound care  Diet:  Dysphagia 1 IVF: None,None VTE: Enoxaparin Code: DNR, full scope care.  Hospice after discharge  Dispo: Looking for disposition options  [facility]. Annajulia Lewing, D.O.  Internal  Medicine Resident, PGY-2 [facility]  Pager: [phone] 12:23 PM, 02/05/2023   **Please contact the on call pager after 5 pm and on weekends at [phone].**

## 2023-02-05 NOTE — Progress Notes (Signed)
[facility] ([facility]) Hospital Liaison Note   [facility] continues to follow for discharge disposition to follow at LTC for hospice services.   Shania Junious, MSW [facility] Hospital Liaison [phone] 

## 2023-02-06 LAB — GLUCOSE, CAPILLARY: Glucose-Capillary: 116 mg/dL — ABNORMAL HIGH (ref 70–99)

## 2023-02-06 NOTE — Progress Notes (Signed)
Subjective:   Summary: Paul Klein is a 67 y.o. year old male currently admitted on the IMTS HD#11 for ESBL bacteremia 2/2 UTI.  Overnight Events: none  Patient assessed at bedside. He is pleasant, interactive, oriented to self only, at baseline.  No pain or discomfort.  Still working with case management to figure out disposition options.   Objective:  Vital signs in last 24 hours: Vitals:   02/05/23 1625 02/05/23 2128 02/06/23 0318 02/06/23 0600  BP: (!) 113/59 130/67  138/65  Pulse: (!) 59 (!) 54  (!) 56  Resp: 18 16  14   Temp: 98.2 F (36.8 C) 97.7 F (36.5 C)  (!) 97.3 F (36.3 C)  TempSrc:  Oral  Oral  SpO2: 99% 98%  98%  Weight:   67.5 kg   Height:       Supplemental O2: Room Air SpO2: 98 %   Physical Exam:  Constitutional: Elderly appearing, NAD, appears comfortable Cardiovascular: RRR, no murmurs, rubs or gallops Abdominal: soft, non-tender, non-distended Skin: warm and dry Neuro: Alert and oriented x 2   Filed Weights   01/24/23 2329 02/06/23 0318  Weight: 72 kg 67.5 kg     Intake/Output Summary (Last 24 hours) at 02/06/2023 0622 Last data filed at 02/05/2023 1532 Gross per 24 hour  Intake 714 ml  Output 600 ml  Net 114 ml    Net IO Since Admission: -3,700.84 mL [02/06/23 0622]  Pertinent Labs:    Latest Ref Rng & Units 01/27/2023   12:49 AM 01/26/2023    4:14 AM 01/25/2023    5:23 AM  CBC  WBC 4.0 - 10.5 K/uL 7.4  9.1  10.8   Hemoglobin 13.0 - 17.0 g/dL 9.2  8.8  16.1   Hematocrit 39.0 - 52.0 % 28.8  27.1  32.0   Platelets 150 - 400 K/uL 152  159  251        Latest Ref Rng & Units 01/27/2023   12:49 AM 01/26/2023    4:14 AM 01/25/2023    5:23 AM  CMP  Glucose 70 - 99 mg/dL 096  045  409   BUN 8 - 23 mg/dL 11  7  9    Creatinine 0.61 - 1.24 mg/dL 8.11  9.14  7.82   Sodium 135 - 145 mmol/L 141  138  136   Potassium 3.5 - 5.1 mmol/L 3.8  2.8  3.5   Chloride 98 - 111 mmol/L 108  105  100   CO2 22 - 32 mmol/L 26  26  23     Calcium 8.9 - 10.3 mg/dL 7.8  7.8  8.7   Total Protein 6.5 - 8.1 g/dL   5.5   Total Bilirubin 0.3 - 1.2 mg/dL   1.1   Alkaline Phos 38 - 126 U/L   112   AST 15 - 41 U/L   20   ALT 0 - 44 U/L   10    Assessment/Plan:   Principal Problem:   Bacteremia due to Escherichia coli Active Problems:   Constipation   Dementia with behavioral disturbance (HCC)   S/p left hip fracture   Urinary retention   Normocytic anemia   Nondisplaced fracture of distal phalanx of right great toe, initial encounter for closed fracture   Bacteremia due to Gram-negative bacteria   Altered mental status   Patient Summary: Paul Klein is a 67 y.o. with  a pertinent PMH of Alzheimer's dementia, recurrent falls and recent left proximal femur fx s/p L hemiarthroplasty in April, who presented with worsening encephalopathy and admitted for ESBL bacteremia from UTI.   ESBL E coli bacteremia 2/2 UTI Bladder stretch injury Chronic urinary retention Vitals remained stable.  He completed course of antibiotics.  Coud cath placed due to difficulty with in and out caths.  Will likely need to remain in place at least for now. - Keep coud cath in place for 1 to 2 weeks at least.   Alzheimer's dementia Physical deconditioning Pleasantly demented.  Unfortunately cannot be discharged back to memory unit while he has a urinary catheter in place.  He is currently not a candidate for inpatient hospice.  PT and OT have recommended SNF, will work with case management and family to consider options. - Delirium precautions - Appreciate TOC, case management options - Dysphagia 1 diet   First distal non displaced great toe phalangeal fracture Soft tissue edema Unknown mechanism per [facility] staff. Will continue conservative management during hospital stay and following discharge - Tylenol 1000 mg TID - Oxycodone 5 mg for breakthrough pain - Ice PRN -Wound care  Diet:  Dysphagia 1 IVF: None,None VTE:  Enoxaparin Code: DNR, full scope care.  Hospice after discharge  Dispo: Looking for disposition options  [name], MD PGY-1 Internal Medicine Resident **Please contact the on call pager after 5 pm and on weekends at [phone].**

## 2023-02-06 NOTE — Progress Notes (Signed)
[facility]) Hospital Liaison Note  [facility] continues to follow for discharge disposition to follow at LTC for hospice services.  Doreatha Martin, RN, BSN [facility] Hospital Liaison [phone]

## 2023-02-07 DIAGNOSIS — R7881 Bacteremia: Secondary | ICD-10-CM | POA: Diagnosis not present

## 2023-02-07 DIAGNOSIS — F03918 Unspecified dementia, unspecified severity, with other behavioral disturbance: Secondary | ICD-10-CM | POA: Diagnosis not present

## 2023-02-07 DIAGNOSIS — R339 Retention of urine, unspecified: Secondary | ICD-10-CM | POA: Diagnosis not present

## 2023-02-07 DIAGNOSIS — B9620 Unspecified Escherichia coli [E. coli] as the cause of diseases classified elsewhere: Secondary | ICD-10-CM | POA: Diagnosis not present

## 2023-02-07 LAB — GLUCOSE, CAPILLARY: Glucose-Capillary: 94 mg/dL (ref 70–99)

## 2023-02-07 MED ORDER — ORAL CARE MOUTH RINSE
15.0000 mL | OROMUCOSAL | Status: DC
  Administered 2023-02-07 – 2023-02-11 (×12): 15 mL via OROMUCOSAL

## 2023-02-07 MED ORDER — ORAL CARE MOUTH RINSE: 15.0000 mL | OROMUCOSAL | Status: DC | PRN

## 2023-02-07 NOTE — Progress Notes (Signed)
Subjective:   Summary: Paul Klein is a 67 y.o. year old male currently admitted on the IMTS HD#11 for ESBL bacteremia 2/2 UTI.  Overnight Events: none  Patient assessed at bedside. He is pleasant, interactive, oriented to self only, at baseline.  No pain or discomfort.  Still working with case management to figure out disposition options specifically looking into other memory care units that would accept him with cath in place.   Objective:  Vital signs in last 24 hours: Vitals:   02/06/23 0918 02/06/23 1716 02/06/23 2220 02/07/23 0537  BP: 113/69 119/71 116/76 138/67  Pulse: (!) 56 60 63 (!) 53  Resp: 17 16 16 16   Temp: (!) 97.5 F (36.4 C) 97.6 F (36.4 C) 97.6 F (36.4 C) 97.8 F (36.6 C)  TempSrc: Axillary Axillary Axillary Oral  SpO2: 99% 98% 97% 100%  Weight:      Height:       Supplemental O2: Room Air SpO2: 100 %   Physical Exam:  Constitutional: Elderly appearing, NAD, appears comfortable Cardiovascular: RRR, no murmurs, rubs or gallops Abdominal: soft, non-tender, non-distended Skin: warm and dry Neuro: Alert and oriented x 2   Filed Weights   01/24/23 2329 02/06/23 0318  Weight: 72 kg 67.5 kg     Intake/Output Summary (Last 24 hours) at 02/07/2023 0613 Last data filed at 02/07/2023 0239 Gross per 24 hour  Intake 300 ml  Output 1000 ml  Net -700 ml    Net IO Since Admission: -4,700.84 mL [02/07/23 0613]  Pertinent Labs:    Latest Ref Rng & Units 01/27/2023   12:49 AM 01/26/2023    4:14 AM 01/25/2023    5:23 AM  CBC  WBC 4.0 - 10.5 K/uL 7.4  9.1  10.8   Hemoglobin 13.0 - 17.0 g/dL 9.2  8.8  16.1   Hematocrit 39.0 - 52.0 % 28.8  27.1  32.0   Platelets 150 - 400 K/uL 152  159  251        Latest Ref Rng & Units 01/27/2023   12:49 AM 01/26/2023    4:14 AM 01/25/2023    5:23 AM  CMP  Glucose 70 - 99 mg/dL 096  045  409   BUN 8 - 23 mg/dL 11  7  9    Creatinine 0.61 - 1.24 mg/dL 8.11  9.14  7.82   Sodium 135 - 145 mmol/L 141   138  136   Potassium 3.5 - 5.1 mmol/L 3.8  2.8  3.5   Chloride 98 - 111 mmol/L 108  105  100   CO2 22 - 32 mmol/L 26  26  23    Calcium 8.9 - 10.3 mg/dL 7.8  7.8  8.7   Total Protein 6.5 - 8.1 g/dL   5.5   Total Bilirubin 0.3 - 1.2 mg/dL   1.1   Alkaline Phos 38 - 126 U/L   112   AST 15 - 41 U/L   20   ALT 0 - 44 U/L   10    Assessment/Plan:   Principal Problem:   Bacteremia due to Escherichia coli Active Problems:   Constipation   Dementia with behavioral disturbance (HCC)   S/p left hip fracture   Urinary retention   Normocytic anemia   Nondisplaced fracture of distal phalanx of right great toe, initial encounter for closed fracture   Bacteremia due to Gram-negative bacteria  Altered mental status   Patient Summary: Paul Klein is a 67 y.o. with a pertinent PMH of Alzheimer's dementia, recurrent falls and recent left proximal femur fx s/p L hemiarthroplasty in April, who presented with worsening encephalopathy and admitted for ESBL bacteremia from UTI.   ESBL E coli bacteremia 2/2 UTI Bladder stretch injury Chronic urinary retention Vitals remained stable.  He completed course of antibiotics.  Coud cath placed on 5/9 due to difficulty with in and out caths.  Will likely need to remain in place, can try trial of void again later this week if he is still here - Keep coud cath in place for 1 week at least and then can retry TOV.   Alzheimer's dementia Physical deconditioning Pleasantly demented.  Unfortunately cannot be discharged back to memory unit while he has a urinary catheter in place.  He is currently not a candidate for inpatient hospice.  PT and OT have recommended SNF, will work with case management and family to consider options. Looking into other memory care units that would accept him with urinary catheter in place. SNF is not a good option for him as he cannot be discharged with hospice (family cannot pay out of pocket). - Delirium precautions - Appreciate  TOC, case management options - Dysphagia 1 diet   First distal non displaced great toe phalangeal fracture Soft tissue edema Unknown mechanism per [facility] staff. Will continue conservative management during hospital stay and following discharge - Tylenol 1000 mg TID - Oxycodone 5 mg for breakthrough pain - Ice PRN -Wound care  Diet:  Dysphagia 1 IVF: None,None VTE: Enoxaparin Code: DNR, full scope care.  Hospice after discharge  Dispo: Looking for disposition options  [name], MD PGY-1 Internal Medicine Resident **Please contact the on call pager after 5 pm and on weekends at [phone].**

## 2023-02-07 NOTE — Progress Notes (Signed)
Physical Therapy Treatment Patient Details Name: Paul Klein MRN: [id number] DOB: 08-Jan-1956 Today's Date: 02/07/2023   History of Present Illness 67 y.o. male presenting from [facility] to [facility] ED 01/25/2023 with AMS and elevated temperature. Admitted with UTI; has been unable to void without catheterization. Recent admission for falls and resulting L hip hemiarthroplasty as well as L great toe fx. PMH significant for Alzheimer's, and anxiety.    PT Comments    Pt greeted supine in bed and agreeable to session for focus on bed mobility and functional transfers. Pt continues to demonstrate poor initiation with all mobility needing significantly increased time and hand over hand to complete when cued, however pt continues to impulsively return to supine from attempting to sit up EOB without physical assist and able to scoot up in bed and reposition intermittently on own. Pt ultimately refusing coming to sit EOB, however agreeable to sitting up in chair position in bed and able to complete LE/UE therex with verbal and tactile cues. Current plan remains appropriate to address deficits and maximize functional independence and decrease caregiver burden. Pt continues to benefit from skilled PT services to progress toward functional mobility goals.    Recommendations for follow up therapy are one component of a multi-disciplinary discharge planning process, led by the attending physician.  Recommendations may be updated based on patient status, additional functional criteria and insurance authorization.  Follow Up Recommendations  Can patient physically be transported by private vehicle: No    Assistance Recommended at Discharge Frequent or constant Supervision/Assistance  Patient can return home with the following Two people to help with walking and/or transfers;Two people to help with bathing/dressing/bathroom;Direct supervision/assist for financial management;Direct supervision/assist for  medications management;Assist for transportation;Assistance with cooking/housework;Assistance with feeding;Help with stairs or ramp for entrance   Equipment Recommendations  Other (comment) (defer to post acute, likely lift and WC)    Recommendations for Other Services       Precautions / Restrictions Precautions Precautions: Fall Precaution Comments: Unclear in chart whether pt still has posterior hip precautions so maintained during session Restrictions Weight Bearing Restrictions: No LLE Weight Bearing: Weight bearing as tolerated     Mobility  Bed Mobility Overal bed mobility: Needs Assistance Bed Mobility: Supine to Sit, Sit to Supine     Supine to sit: Max assist Sit to supine: Min guard   General bed mobility comments: pt needing increased time and assist to all extremities initiallybring LEs to and off EOB, impulsively returned to supine without any assist. x5 trials to come to EOB with pt resisting each trial despite multimodal cues to complete    Transfers                   General transfer comment: pt refused    Ambulation/Gait               General Gait Details: pt refused   Stairs             Wheelchair Mobility    Modified Rankin (Stroke Patients Only)       Balance       Sitting balance - Comments: pt refusing to sit EOB                                    Cognition Arousal/Alertness: Awake/alert Behavior During Therapy: Flat affect Overall Cognitive Status: History of cognitive impairments - at baseline  General Comments: Intermittently able to follow very basic commands, requires significant cueing. inconsistent with ye/no. became agitated with PTA assist for mobility        Exercises General Exercises - Upper Extremity Shoulder Flexion: AROM, Right, Left, 5 reps, Seated Shoulder Extension: AROM, Right, Left, 5 reps, Seated General Exercises - Lower  Extremity Long Arc Quad: AROM, Right, Left, 10 reps, Seated Hip Flexion/Marching: AROM, Right, Left, 10 reps, Seated    General Comments General comments (skin integrity, edema, etc.): VSS on RA      Pertinent Vitals/Pain Pain Assessment Pain Assessment: Faces Faces Pain Scale: Hurts little more Pain Location: L hip Pain Descriptors / Indicators: Grimacing Pain Intervention(s): Monitored during session, Limited activity within patient's tolerance    Home Living                          Prior Function            PT Goals (current goals can now be found in the care plan section) Acute Rehab PT Goals Patient Stated Goal: unable to state PT Goal Formulation: Patient unable to participate in goal setting Time For Goal Achievement: 02/18/23 Progress towards PT goals: Not progressing toward goals - comment    Frequency    Min 2X/week      PT Plan Current plan remains appropriate    Co-evaluation              AM-PAC PT "6 Clicks" Mobility   Outcome Measure  Help needed turning from your back to your side while in a flat bed without using bedrails?: Total Help needed moving from lying on your back to sitting on the side of a flat bed without using bedrails?: Total Help needed moving to and from a bed to a chair (including a wheelchair)?: Total Help needed standing up from a chair using your arms (e.g., wheelchair or bedside chair)?: Total Help needed to walk in hospital room?: Total Help needed climbing 3-5 steps with a railing? : Total 6 Click Score: 6    End of Session   Activity Tolerance: Treatment limited secondary to agitation Patient left: in bed;with call bell/phone within reach;with bed alarm set;Other (comment);with SCD's reapplied (with bed in chair position) Nurse Communication: Other (comment) PT Visit Diagnosis: Other abnormalities of gait and mobility (R26.89);Unsteadiness on feet (R26.81);Pain;Muscle weakness (generalized)  (M62.81) Pain - Right/Left: Left Pain - part of body: Hip     Time: 1037-1050 PT Time Calculation (min) (ACUTE ONLY): 13 min  Charges:  $Therapeutic Exercise: 8-22 mins                     Naliah Eddington R. PTA Acute Rehabilitation Services Office: [phone]    Catalina Antigua 02/07/2023, 10:56 AM

## 2023-02-07 NOTE — NC FL2 (Addendum)
[place] MEDICAID FL2 LEVEL OF CARE FORM     IDENTIFICATION  Patient Name: Paul Klein Birthdate: 02/21/1956 Sex: male Admission Date (Current Location): 01/24/2023  [place] Number:  Haynes Bast [id number] O Facility and Address:  [facility], [address], [place], [place] [address]      Provider Number: [id number]  Attending Physician Name and Address:  Ginnie Smart, MD  Relative Name and Phone Number:  Maddyx, Nand (Spouse) [phone]    Current Level of Care: Hospital Recommended Level of Care: Skilled Nursing Facility, Memory Care Prior Approval Number:    Date Approved/Denied:   PASRR Number: pending  Discharge Plan: SNF    Current Diagnoses: Patient Active Problem List   Diagnosis Date Noted   Altered mental status 01/31/2023   Bacteremia due to Gram-negative bacteria 01/26/2023   Urinary retention 01/25/2023   Normocytic anemia 01/25/2023   Nondisplaced fracture of distal phalanx of right great toe, initial encounter for closed fracture 01/25/2023   Dysphagia 01/01/2023   Femur fracture, left (HCC) 12/31/2022   Closed fracture of left hip (HCC) 12/31/2022   S/p left hip fracture 12/30/2022   Alzheimer's dementia without behavioral disturbance, psychotic disturbance, mood disturbance, or anxiety (HCC) 12/26/2022   Encephalopathy 12/25/2022   Weakness 12/25/2022   Bacteremia due to Escherichia coli 12/25/2022   Dementia with behavioral disturbance (HCC) 07/09/2021   External hemorrhoid 06/02/2021   Hallucination 06/02/2021   Anxiety 06/02/2021   Rectal discomfort 06/02/2021   Advance directive discussed with patient 01/25/2021   Need for pneumococcal vaccination 01/25/2021   Hyperlipidemia 11/30/2020   Encounter for health maintenance examination in adult 11/30/2020   Medicare annual wellness visit, subsequent 11/30/2020   Alzheimer disease (HCC) 11/30/2020   Abnormal albumin 07/07/2020   Impaired fasting blood  sugar 07/07/2020   Atherosclerosis of coronary artery of native heart without angina pectoris 07/07/2020   Aortic atherosclerosis (HCC) 07/07/2020   Essential hypertension, benign 04/29/2020   Urinary frequency 04/29/2020   Overactive bladder 04/29/2020   Benign prostatic hyperplasia with urinary frequency 04/29/2020   Screen for colon cancer 04/29/2020   Dementia without behavioral disturbance (HCC) 04/15/2020   Abdominal discomfort 04/15/2020   Frequent urination 04/15/2020   Constipation 10/15/2019   Bradycardia 07/18/2018   Onychomycosis 08/30/2016   Insomnia 08/03/2015   Generalized anxiety disorder 08/03/2015   Screening for prostate cancer 08/03/2015   Need for influenza vaccination 08/03/2015   Vaccine counseling 08/03/2015   Second hand tobacco smoke exposure 07/31/2014   Dyslipidemia 07/31/2014    Orientation RESPIRATION BLADDER Height & Weight     Self  Normal Incontinent, Indwelling catheter (coude catheter) Weight: 148 lb 13 oz (67.5 kg) Height:  6' (182.9 cm)  BEHAVIORAL SYMPTOMS/MOOD NEUROLOGICAL BOWEL NUTRITION STATUS      Incontinent Diet (see d/c summary)  AMBULATORY STATUS COMMUNICATION OF NEEDS Skin   Total Care Verbally Surgical wounds, Skin abrasions, PU Stage and Appropriate Care (L Hip incision; R. toe- non pressure; L. ankle- skin tear; R. buttock- irritant dermatitis)                       Personal Care Assistance Level of Assistance  Total care Bathing Assistance: Maximum assistance Feeding assistance: Maximum assistance Dressing Assistance: Maximum assistance Total Care Assistance: Maximum assistance   Functional Limitations Info  Sight, Hearing, Speech Sight Info: Impaired Hearing Info: Impaired Speech Info: Impaired    SPECIAL CARE FACTORS FREQUENCY  PT (By licensed PT), OT (By licensed  OT)     PT Frequency: 3x/ week OT Frequency: 3x/ week            Contractures Contractures Info: Not present    Additional Factors Info   Code Status, Allergies, Isolation Precautions Code Status Info: DNR Allergies Info: Prozac (Fluoxetine Hcl)     Isolation Precautions Info: Contact ESBL     Current Medications (02/07/2023):  This is the current hospital active medication list Current Facility-Administered Medications  Medication Dose Route Frequency Provider Last Rate Last Admin   0.9 %  sodium chloride infusion   Intravenous PRN Tyson Alias, MD   Stopped at 01/28/23 [phone]   acetaminophen (TYLENOL) tablet 1,000 mg  1,000 mg Oral Q8H Morene Crocker, MD   1,000 mg at 02/06/23 2202   atorvastatin (LIPITOR) tablet 20 mg  20 mg Oral QHS Rocky Morel, DO   20 mg at 02/06/23 2202   Chlorhexidine Gluconate Cloth 2 % PADS 6 each  6 each Topical Daily Tyson Alias, MD   6 each at 02/06/23 1453   enoxaparin (LOVENOX) injection 40 mg  40 mg Subcutaneous Q24H Morene Crocker, MD   40 mg at 02/06/23 1443   feeding supplement (ENSURE ENLIVE / ENSURE PLUS) liquid 237 mL  237 mL Oral BID BM Atway, Rayann N, DO   237 mL at 02/05/23 1359   lidocaine (XYLOCAINE) 2 % jelly 1 Application  1 Application Urethral Once Ginnie Smart, MD       mupirocin cream (BACTROBAN) 2 %   Topical Daily Tyson Alias, MD   Given at 02/06/23 1002   Oral care mouth rinse  15 mL Mouth Rinse 4 times per day Ginnie Smart, MD       Oral care mouth rinse  15 mL Mouth Rinse PRN Ginnie Smart, MD       polyethylene glycol (MIRALAX / GLYCOLAX) packet 17 g  17 g Oral Daily PRN [name], MD       senna (SENOKOT) tablet 8.6 mg  1 tablet Oral Daily [name], MD   8.6 mg at 02/06/23 1002   tamsulosin (FLOMAX) capsule 0.4 mg  0.4 mg Oral Daily [name], MD   0.4 mg at 02/06/23 1002     Discharge Medications: Please see discharge summary for a list of discharge medications.  Relevant Imaging Results:  Relevant Lab Results:   Additional Information SS#: [id number],  Will need LTC and memory care  Renie Stelmach F Ileigh Mettler, LCSWA

## 2023-02-07 NOTE — TOC Progression Note (Addendum)
Transition of Care [facility]) - Initial/Assessment Note    Patient Details  Name: Paul Klein MRN: [id number] Date of Birth: July 27, 1956  [facility] of [facility] CM/SW Contact:    Paul Klein, LCSWA Phone Number: 02/07/2023, 12:59 PM  Clinical Narrative:                 LCSW faxed FL2 to both ALFs and SNFs (Long term) and is awaiting bed offers.  1500-  The patient received a bed offer from [facility] and [facility].  LCSW contacted the facilities.  Paul [facility] can accept patient through his Medicaid.  Paul [facility] is unable to accept with Medicaid pending for long term care.    LCSW contacted the patient's spouse to present bed offer from Paul Klein.  The patient's wife is hesitant to accept the bed offer, but reports that her daughter will go view the facility tomorrow.  The spouse reports that the family is unable to pay anything out of pocket.    LCSW contacted Paul to inform of the possible acceptance.  The facility will now have to review the patient again and inform LCSW of whether the facility can accept.  LCSW contacted the following facilities:  [facility]- left message requesting a returned call from admissions [facility]- left message requesting a returned call [facility]- left voicemail from business office [facility]- waiting to hear if the facility has any Medicaid beds available [facility]- does not accept Medicaid [facility]- does not accept Medicaid [facility]- does not accept catheters [facility]- unable to accept. [facility]- does not accept Medicaid  TOC following.   Expected Discharge Plan: Hospice Medical Facility Barriers to Discharge: Barriers Resolved   Patient Goals and CMS Choice Patient states their goals for this hospitalization and ongoing recovery are:: To [facility] to [facility] [place]. CMS Medicare.gov Compare Post Acute Care list provided to:: Patient Choice offered  to / list presented to : Patient, Spouse, Adult Children (Daughter, Paul Klein)      Expected Discharge Plan and Services In-house Referral: Clinical Social Work   Post Acute Care Choice: Residential Hospice Bed Living arrangements for the past 2 months: Assisted Living Facility Expected Discharge Date: 01/31/23                           Paul Klein [facility] Date [facility] Agency Contacted: 02/01/23 Time HH Agency Contacted: 1145 Representative spoke with at Paul Klein Agency: Paul Klein  Prior Living Arrangements/Services Living arrangements for the past 2 months: Assisted Living Facility Lives with:: Facility Resident Patient language and need for interpreter reviewed:: No Do you feel safe going back to the place where you live?: Yes      Need for Family Participation in Patient Care: Yes (Comment) Care giver support system in place?: Yes (comment)   Criminal Activity/Legal Involvement Pertinent to Current Situation/Hospitalization: No - Comment as needed  Activities of Daily Living      Permission Sought/Granted   Permission granted to share information with : Yes, Verbal Permission Granted (from spouse as patient has alzheimer's disease)     Permission granted to share info w AGENCY: [name]        Emotional Assessment Appearance:: Appears stated age Attitude/Demeanor/Rapport: Unable to Assess Affect (typically observed): Unable to Assess Orientation: :  (disoriented) Alcohol / Substance Use: Not Applicable Psych Involvement: No (comment)  Admission diagnosis:  Acute cystitis with hematuria [N30.01] Sepsis (HCC) [A41.9] Altered mental status, unspecified altered mental status type [R41.82] Bacteremia due to Gram-negative bacteria [R78.81] Patient Active Problem List   Diagnosis Date Noted   Altered mental status 01/31/2023   Bacteremia due to Gram-negative bacteria 01/26/2023   Urinary retention 01/25/2023   Normocytic anemia  01/25/2023   Nondisplaced fracture of distal phalanx of right great toe, initial encounter for closed fracture 01/25/2023   Dysphagia 01/01/2023   Femur fracture, left (HCC) 12/31/2022   Closed fracture of left hip (HCC) 12/31/2022   S/p left hip fracture 12/30/2022   Alzheimer's dementia without behavioral disturbance, psychotic disturbance, mood disturbance, or anxiety (HCC) 12/26/2022   Encephalopathy 12/25/2022   Weakness 12/25/2022   Bacteremia due to Escherichia coli 12/25/2022   Dementia with behavioral disturbance (HCC) 07/09/2021   External hemorrhoid 06/02/2021   Hallucination 06/02/2021   Anxiety 06/02/2021   Rectal discomfort 06/02/2021   Advance directive discussed with patient 01/25/2021   Need for pneumococcal vaccination 01/25/2021   Hyperlipidemia 11/30/2020   Encounter for health maintenance examination in adult 11/30/2020   Medicare annual wellness visit, subsequent 11/30/2020   Alzheimer disease (HCC) 11/30/2020   Abnormal albumin 07/07/2020   Impaired fasting blood sugar 07/07/2020   Atherosclerosis of coronary artery of native heart without angina pectoris 07/07/2020   Aortic atherosclerosis (HCC) 07/07/2020   Essential hypertension, benign 04/29/2020   Urinary frequency 04/29/2020   Overactive bladder 04/29/2020   Benign prostatic hyperplasia with urinary frequency 04/29/2020   Screen for colon cancer 04/29/2020   Dementia without behavioral disturbance (HCC) 04/15/2020   Abdominal discomfort 04/15/2020   Frequent urination 04/15/2020   Constipation 10/15/2019   Bradycardia 07/18/2018   Onychomycosis 08/30/2016   Insomnia 08/03/2015   Generalized anxiety disorder 08/03/2015   Screening for prostate cancer 08/03/2015   Need for influenza vaccination 08/03/2015   Vaccine counseling 08/03/2015   Second hand tobacco smoke exposure 07/31/2014   Dyslipidemia 07/31/2014   PCP:  System, Provider Not In Pharmacy:   [facility] - [place],  [place] - [address] [place] [address] Phone: [phone] Fax: [phone]  [facility] [address] [place] [address] Phone: [phone] Fax: [phone]     Social Determinants of Health (SDOH) Social History: SDOH Screenings   Depression (PHQ2-9): Low Risk  (06/02/2021)  Tobacco Use: Medium Risk (01/03/2023)   SDOH Interventions:     Readmission Risk Interventions     No data to display

## 2023-02-08 DIAGNOSIS — B9620 Unspecified Escherichia coli [E. coli] as the cause of diseases classified elsewhere: Secondary | ICD-10-CM | POA: Diagnosis not present

## 2023-02-08 DIAGNOSIS — R7881 Bacteremia: Secondary | ICD-10-CM | POA: Diagnosis not present

## 2023-02-08 DIAGNOSIS — R339 Retention of urine, unspecified: Secondary | ICD-10-CM | POA: Diagnosis not present

## 2023-02-08 LAB — BASIC METABOLIC PANEL
Anion gap: 10 (ref 5–15)
BUN: 12 mg/dL (ref 8–23)
CO2: 27 mmol/L (ref 22–32)
Calcium: 8.9 mg/dL (ref 8.9–10.3)
Chloride: 103 mmol/L (ref 98–111)
Creatinine, Ser: 0.77 mg/dL (ref 0.61–1.24)
GFR, Estimated: >60 mL/min (ref >60)
Glucose, Bld: 113 mg/dL — ABNORMAL HIGH (ref 70–99)
Potassium: 3.7 mmol/L (ref 3.5–5.1)
Sodium: 140 mmol/L (ref 135–145)

## 2023-02-08 LAB — CBC
HCT: 37.7 % — ABNORMAL LOW (ref 39.0–52.0)
Hemoglobin: 11.4 g/dL — ABNORMAL LOW (ref 13.0–17.0)
MCH: 25.7 pg — ABNORMAL LOW (ref 26.0–34.0)
MCHC: 30.2 g/dL (ref 30.0–36.0)
MCV: 85.1 fL (ref 80.0–100.0)
Platelets: 492 10*3/uL — ABNORMAL HIGH (ref 150–400)
RBC: 4.43 MIL/uL (ref 4.22–5.81)
RDW: 16.8 % — ABNORMAL HIGH (ref 11.5–15.5)
WBC: 9.2 10*3/uL (ref 4.0–10.5)
nRBC: 0 % (ref 0.0–0.2)

## 2023-02-08 LAB — GLUCOSE, CAPILLARY: Glucose-Capillary: 125 mg/dL — ABNORMAL HIGH (ref 70–99)

## 2023-02-08 NOTE — [place] FL2 (Signed)
[place] MEDICAID FL2 LEVEL OF CARE FORM     IDENTIFICATION  Patient Name: Paul Klein Birthdate: Mar 06, 1956 Sex: male Admission Date (Current Location): 01/24/2023  [place] and [place] Number:  Haynes Bast [id number] O Facility and Address:  [facility] [place], [address] [place], [place] [address]      Provider Number: [id number]  Attending Physician Name and Address:  Ginnie Smart, MD  Relative Name and Phone Number:  Krystian, Poliseno (Spouse) [phone]    Current Level of Care: Hospital Recommended Level of Care: Assisted Living Facility Prior Approval Number:    Date Approved/Denied:   PASRR Number: pending  Discharge Plan: Other (Comment) (Assisted Living Facility)    Current Diagnoses: Patient Active Problem List   Diagnosis Date Noted   Altered mental status 01/31/2023   Bacteremia due to Gram-negative bacteria 01/26/2023   Urinary retention 01/25/2023   Normocytic anemia 01/25/2023   Nondisplaced fracture of distal phalanx of right great toe, initial encounter for closed fracture 01/25/2023   Dysphagia 01/01/2023   Femur fracture, left (HCC) 12/31/2022   Closed fracture of left hip (HCC) 12/31/2022   S/p left hip fracture 12/30/2022   Alzheimer's dementia without behavioral disturbance, psychotic disturbance, mood disturbance, or anxiety (HCC) 12/26/2022   Encephalopathy 12/25/2022   Weakness 12/25/2022   Bacteremia due to Escherichia coli 12/25/2022   Dementia with behavioral disturbance (HCC) 07/09/2021   External hemorrhoid 06/02/2021   Hallucination 06/02/2021   Anxiety 06/02/2021   Rectal discomfort 06/02/2021   Advance directive discussed with patient 01/25/2021   Need for pneumococcal vaccination 01/25/2021   Hyperlipidemia 11/30/2020   Encounter for health maintenance examination in adult 11/30/2020   Medicare annual wellness visit, subsequent 11/30/2020   Alzheimer disease (HCC) 11/30/2020   Abnormal albumin 07/07/2020    Impaired fasting blood sugar 07/07/2020   Atherosclerosis of coronary artery of native heart without angina pectoris 07/07/2020   Aortic atherosclerosis (HCC) 07/07/2020   Essential hypertension, benign 04/29/2020   Urinary frequency 04/29/2020   Overactive bladder 04/29/2020   Benign prostatic hyperplasia with urinary frequency 04/29/2020   Screen for colon cancer 04/29/2020   Dementia without behavioral disturbance (HCC) 04/15/2020   Abdominal discomfort 04/15/2020   Frequent urination 04/15/2020   Constipation 10/15/2019   Bradycardia 07/18/2018   Onychomycosis 08/30/2016   Insomnia 08/03/2015   Generalized anxiety disorder 08/03/2015   Screening for prostate cancer 08/03/2015   Need for influenza vaccination 08/03/2015   Vaccine counseling 08/03/2015   Second hand tobacco smoke exposure 07/31/2014   Dyslipidemia 07/31/2014    Orientation RESPIRATION BLADDER Height & Weight     Self  Normal Incontinent, Indwelling catheter (coude catheter) Weight: 137 lb 2 oz (62.2 kg) Height:  6' (182.9 cm)  BEHAVIORAL SYMPTOMS/MOOD NEUROLOGICAL BOWEL NUTRITION STATUS      Incontinent Diet (see d/c summary)  AMBULATORY STATUS COMMUNICATION OF NEEDS Skin   Total Care Verbally Surgical wounds, Skin abrasions, PU Stage and Appropriate Care (L Hip incision; R. toe- non pressure; L. ankle- skin tear; R. buttock- irritant dermatitis)                       Personal Care Assistance Level of Assistance  Total care Bathing Assistance: Maximum assistance Feeding assistance: Maximum assistance Dressing Assistance: Maximum assistance Total Care Assistance: Maximum assistance   Functional Limitations Info  Sight, Hearing, Speech Sight Info: Impaired Hearing Info: Impaired Speech Info: Impaired    SPECIAL CARE FACTORS FREQUENCY  PT (By licensed PT), OT (  By licensed OT)                 Contractures Contractures Info: Not present    Additional Factors Info  Code Status,  Allergies, Isolation Precautions Code Status Info: DNR Allergies Info: Prozac (Fluoxetine Hcl)     Isolation Precautions Info: Contact ESBL     Current Medications (02/08/2023):  This is the current hospital active medication list Current Facility-Administered Medications  Medication Dose Route Frequency Provider Last Rate Last Admin   0.9 %  sodium chloride infusion   Intravenous PRN Tyson Alias, MD   Stopped at 01/28/23 [phone]   acetaminophen (TYLENOL) tablet 1,000 mg  1,000 mg Oral Q8H Morene Crocker, MD   1,000 mg at 02/08/23 1304   atorvastatin (LIPITOR) tablet 20 mg  20 mg Oral QHS Rocky Morel, DO   20 mg at 02/07/23 2115   Chlorhexidine Gluconate Cloth 2 % PADS 6 each  6 each Topical Daily Tyson Alias, MD   6 each at 02/08/23 0835   enoxaparin (LOVENOX) injection 40 mg  40 mg Subcutaneous Q24H Morene Crocker, MD   40 mg at 02/07/23 1409   feeding supplement (ENSURE ENLIVE / ENSURE PLUS) liquid 237 mL  237 mL Oral BID BM Atway, Rayann N, DO   237 mL at 02/08/23 1304   lidocaine (XYLOCAINE) 2 % jelly 1 Application  1 Application Urethral Once Ginnie Smart, MD       mupirocin cream (BACTROBAN) 2 %   Topical Daily Tyson Alias, MD   Given at 02/08/23 [phone]   Oral care mouth rinse  15 mL Mouth Rinse 4 times per day Ginnie Smart, MD   15 mL at 02/08/23 1144   Oral care mouth rinse  15 mL Mouth Rinse PRN Ginnie Smart, MD       polyethylene glycol (MIRALAX / GLYCOLAX) packet 17 g  17 g Oral Daily PRN [name], MD       senna (SENOKOT) tablet 8.6 mg  1 tablet Oral Daily [name], MD   8.6 mg at 02/07/23 1057   tamsulosin (FLOMAX) capsule 0.4 mg  0.4 mg Oral Daily [name], MD   0.4 mg at 02/08/23 0834     Discharge Medications: Medications 02/08/23 02/09/23 02/10/23 02/11/23 02/12/23 02/13/23 02/14/23  acetaminophen (TYLENOL) tablet 1,000 mg Dose: 1,000 mg Freq: Every 8 hours Route:  PO Start: 01/25/23 0730  Admin Instructions:  Maximum dose of acetaminophen in 4000 mg from all sources in 24 hours.   (0732)   1304   2200    0600   1400   2200    0600   1400   2200    0600   1400   2200    0600   1400   2200    0600   1400   2200    0600   1400   2200     atorvastatin (LIPITOR) tablet 20 mg Dose: 20 mg Freq: Daily at bedtime Route: PO Start: 01/25/23 2200   2200    2200    2200    2200    2200    2200    2200     Chlorhexidine Gluconate Cloth 2 % PADS 6 each Dose: 6 each Freq: Daily Route: TP Start: 01/26/23 1000  Admin Instructions:  Apply chlorhexidine with firm massage to remove bacteria. Skin may feel sticky for a few minutes after application. Do NOT wipe off.  Allow  to air dry. 6 cloth bath instructions:   1. Neck, shoulders, and chest   2. Both arms and hands   3. Abdomen and groin   4. Right leg and foot   5. Left leg and foot   6. Back and buttocks   0835    1000    1000    1000    1000    1000    1000     enoxaparin (LOVENOX) injection 40 mg Dose: 40 mg Freq: Every 24 hours Route: [place] Start: 01/25/23 1500  Admin Instructions:  Do NOT expel air bubble from syringe before giving.   1500    1500    1500    1500    1500    1500    1500     feeding supplement (ENSURE ENLIVE / ENSURE PLUS) liquid 237 mL Dose: 237 mL Freq: 2 times daily between meals Route: PO Start: 01/29/23 1000   0835   1304    1000   1400    1000   1400    1000   1400    1000   1400    1000   1400    1000   1400     lidocaine (XYLOCAINE) 2 % jelly 1 Application Dose: 1 Application Freq:  Once Route: UR Start: 02/02/23 1800           mupirocin cream (BACTROBAN) 2 % Freq: Daily Route: TP Start: 01/25/23 1000  Admin Instructions:  Apply Bactroban to right great toe wound Q day, then cover with gauze and tape   0834    1000    1000    1000    1000    1000     1000     Oral care mouth rinse Dose: 15 mL Freq: 4 times per day Route: Mouth Rinse Start: 02/07/23 1200  Admin Instructions:  Suction Debris from the mouth. Brush teeth with a suction toothbrush with antiseptic oral rinse. Apply moisturizer in the mouth and on the lips. This is NOT a pharmacy stocked item. Obtain from [facility].   0834   1144   1600   2200    0800   1200   1600   2200    0800   1200   1600   2200    0800   1200   1600   2200    0800   1200   1600   2200    0800   1200   1600   2200    0800    1200   1600   2200     senna (SENOKOT) tablet 8.6 mg Dose: 1 tablet Freq: Daily Route: PO Start: 01/31/23 1000   (0834)    1000    1000    1000    1000    1000    1000     tamsulosin (FLOMAX) capsule 0.4 mg Dose: 0.4 mg Freq: Daily Route: PO Start: 02/01/23 1015  Admin Instructions:  Do not crush or open capsule. Do not give per tube.   1610             Relevant Imaging Results:  Relevant Lab Results:   Additional Information SS#: [id number], [facility]  [employer] [employer] Alyana Kreiter, LCSWA

## 2023-02-08 NOTE — Progress Notes (Signed)
Subjective:   Summary: Paul Klein is a 67 y.o. year old male currently admitted on the IMTS HD#11 for ESBL bacteremia 2/2 UTI.  Overnight Events: 1 episode of hypotension yesterday with BP 89/51 which resolved without intervention.  No further episodes of hypotension overnight.  He is occasionally sinus bradycardic.  Of note, his p.o. intake has been documented as poor.  Will continue assisting with feeds and encouraging p.o. intake.  Will also check labs today.  Patient assessed at bedside. He is pleasant, interactive, oriented to self only, at baseline.  No pain or discomfort.  Still working with case management to figure out disposition options, they have reached out to multiple ALS yesterday and his case is being reviewed at a few of them.  He was excepted at [facility].  Family is going to visit the facility today.   Objective:  Vital signs in last 24 hours: Vitals:   02/07/23 0537 02/07/23 0822 02/07/23 1611 02/07/23 2202  BP: 138/67 130/70 (!) 89/51 114/69  Pulse: (!) 53 (!) 59 69 64  Resp: 16 16 17 12   Temp: 97.8 F (36.6 C) (!) 97.4 F (36.3 C) 98.2 F (36.8 C) 98.1 F (36.7 C)  TempSrc: Oral   Oral  SpO2: 100% 100% 100% 99%  Weight:      Height:       Supplemental O2: Room Air SpO2: 99 %   Physical Exam:  Constitutional: Elderly appearing, NAD, appears comfortable Cardiovascular: RRR, no murmurs, rubs or gallops Abdominal: soft, non-tender, non-distended Skin: warm and dry Neuro: Alert and oriented x 1 (self)   Filed Weights   01/24/23 2329 02/06/23 0318  Weight: 72 kg 67.5 kg     Intake/Output Summary (Last 24 hours) at 02/08/2023 0629 Last data filed at 02/07/2023 2202 Gross per 24 hour  Intake 120 ml  Output 550 ml  Net -430 ml    Net IO Since Admission: -5,130.84 mL [02/08/23 0629]  Pertinent Labs:    Latest Ref Rng & Units 01/27/2023   12:49 AM 01/26/2023    4:14 AM 01/25/2023    5:23 AM  CBC  WBC 4.0 - 10.5 K/uL 7.4  9.1   10.8   Hemoglobin 13.0 - 17.0 g/dL 9.2  8.8  16.1   Hematocrit 39.0 - 52.0 % 28.8  27.1  32.0   Platelets 150 - 400 K/uL 152  159  251        Latest Ref Rng & Units 01/27/2023   12:49 AM 01/26/2023    4:14 AM 01/25/2023    5:23 AM  CMP  Glucose 70 - 99 mg/dL 096  045  409   BUN 8 - 23 mg/dL 11  7  9    Creatinine 0.61 - 1.24 mg/dL 8.11  9.14  7.82   Sodium 135 - 145 mmol/L 141  138  136   Potassium 3.5 - 5.1 mmol/L 3.8  2.8  3.5   Chloride 98 - 111 mmol/L 108  105  100   CO2 22 - 32 mmol/L 26  26  23    Calcium 8.9 - 10.3 mg/dL 7.8  7.8  8.7   Total Protein 6.5 - 8.1 g/dL   5.5   Total Bilirubin 0.3 - 1.2 mg/dL   1.1   Alkaline Phos 38 - 126 U/L   112   AST 15 - 41 U/L   20   ALT  0 - 44 U/L   10    Assessment/Plan:   Principal Problem:   Bacteremia due to Escherichia coli Active Problems:   Constipation   Dementia with behavioral disturbance (HCC)   S/p left hip fracture   Urinary retention   Normocytic anemia   Nondisplaced fracture of distal phalanx of right great toe, initial encounter for closed fracture   Bacteremia due to Gram-negative bacteria   Altered mental status   Patient Summary: Paul Klein is a 67 y.o. with a pertinent PMH of Alzheimer's dementia, recurrent falls and recent left proximal femur fx s/p L hemiarthroplasty in April, who presented with worsening encephalopathy and admitted for ESBL bacteremia from UTI.   ESBL E coli bacteremia 2/2 UTI Bladder stretch injury Chronic urinary retention Vitals remained stable.  He completed course of antibiotics.  Coud cath placed on 5/9 due to difficulty with in and out caths.  Will likely need to remain in place, can try trial of void again later this week if he is still here - Keep coud cath in place for 1 week at least and then can retry TOV.   Alzheimer's dementia Physical deconditioning Pleasantly demented.  Unfortunately cannot be discharged back to memory unit while he has a urinary catheter in place.  He  is currently not a candidate for inpatient hospice.  PT and OT have recommended SNF, will work with case management and family to consider options. Looking into other memory care units that would accept him with urinary catheter in place. SNF is not a good option for him as he cannot be discharged with hospice (family cannot pay out of pocket). - Delirium precautions - Appreciate TOC, case management options - Dysphagia 1 diet.  Assist with feeds and p.o. intake   First distal non displaced great toe phalangeal fracture Soft tissue edema Unknown mechanism per [facility] staff. Will continue conservative management during hospital stay and following discharge - Tylenol 1000 mg TID - Oxycodone 5 mg for breakthrough pain - Ice PRN -Wound care  Diet:  Dysphagia 1 IVF: None,None VTE: Enoxaparin Code: DNR, full scope care.  Hospice after discharge  Dispo: Looking for disposition options  [name], MD PGY-1 Internal Medicine Resident **Please contact the on call pager after 5 pm and on weekends at [phone].**

## 2023-02-08 NOTE — TOC Progression Note (Addendum)
Transition of Care [facility]) - Initial/Assessment Note    Patient Details  Name: Paul Klein MRN: [id number] Date of Birth: [birth year]  [facility] CM/SW Contact:    Ralene Bathe, LCSWA Phone Number: 02/08/2023, 9:00 AM  Clinical Narrative:                 LCSW received a returned call from [place] with [facility]. [phone]) The facility's clinical team is reviewing the patient and will inform LCSW of the outcome later this afternoon.  Addendum 13:30-  LCSW received a call from [place] at  [facility]. The facility requested updated clinicals to review.  Clinical sent.  LCSW contacted the patient's spouse to inform of the above information.  There was no answer.  LCSW left a VM requesting a returned call.    Addendum 15:37-  [facility] is unable to accept.  [employer] rescinded bed offer.   TOC following.   Expected Discharge Plan: Hospice Medical Facility Barriers to Discharge: Barriers Resolved   Patient Goals and CMS Choice Patient states their goals for this hospitalization and ongoing recovery are:: To go to [facility] [place]. CMS Medicare.gov Compare Post Acute Care list provided to:: Patient Choice offered to / list presented to : Patient, Spouse, Adult Children (Daughter, [name])      Expected Discharge Plan and Services In-house Referral: Clinical Social Work   Post Acute Care Choice: Residential Hospice Bed Living arrangements for the past 2 months: Assisted Living Facility Expected Discharge Date: 01/31/23                           [facility] Agency: [facility] Date [facility] Agency Contacted: 02/01/23 Time HH Agency Contacted: 1145 Representative spoke with at [facility] Agency: Drenda Freeze  Prior Living Arrangements/Services Living arrangements for the past 2 months: Assisted Living Facility Lives with:: Facility Resident Patient language and need for interpreter reviewed:: No Do you feel  safe going back to the place where you live?: Yes      Need for Family Participation in Patient Care: Yes (Comment) Care giver support system in place?: Yes (comment)   Criminal Activity/Legal Involvement Pertinent to Current Situation/Hospitalization: No - Comment as needed  Activities of Daily Living      Permission Sought/Granted   Permission granted to share information with : Yes, Verbal Permission Granted (from spouse as patient has alzheimer's disease)     Permission granted to share info w AGENCY: [name]        Emotional Assessment Appearance:: Appears stated age Attitude/Demeanor/Rapport: Unable to Assess Affect (typically observed): Unable to Assess Orientation: :  (disoriented) Alcohol / Substance Use: Not Applicable Psych Involvement: No (comment)  Admission diagnosis:  Acute cystitis with hematuria [N30.01] Sepsis (HCC) [A41.9] Altered mental status, unspecified altered mental status type [R41.82] Bacteremia due to Gram-negative bacteria [R78.81] Patient Active Problem List   Diagnosis Date Noted   Altered mental status 01/31/2023   Bacteremia due to Gram-negative bacteria 01/26/2023   Urinary retention 01/25/2023   Normocytic anemia 01/25/2023   Nondisplaced fracture of distal phalanx of right great toe, initial encounter for closed fracture 01/25/2023   Dysphagia 01/01/2023   Femur fracture, left (HCC) 12/31/2022   Closed fracture of left hip (HCC) 12/31/2022   S/p left hip fracture 12/30/2022   Alzheimer's dementia without behavioral disturbance, psychotic disturbance, mood disturbance, or anxiety (HCC) 12/26/2022   Encephalopathy 12/25/2022   Weakness  12/25/2022   Bacteremia due to Escherichia coli 12/25/2022   Dementia with behavioral disturbance (HCC) 07/09/2021   External hemorrhoid 06/02/2021   Hallucination 06/02/2021   Anxiety 06/02/2021   Rectal discomfort 06/02/2021   Advance directive discussed with patient 01/25/2021   Need for  pneumococcal vaccination 01/25/2021   Hyperlipidemia 11/30/2020   Encounter for health maintenance examination in adult 11/30/2020   Medicare annual wellness visit, subsequent 11/30/2020   Alzheimer disease (HCC) 11/30/2020   Abnormal albumin 07/07/2020   Impaired fasting blood sugar 07/07/2020   Atherosclerosis of coronary artery of native heart without angina pectoris 07/07/2020   Aortic atherosclerosis (HCC) 07/07/2020   Essential hypertension, benign 04/29/2020   Urinary frequency 04/29/2020   Overactive bladder 04/29/2020   Benign prostatic hyperplasia with urinary frequency 04/29/2020   Screen for colon cancer 04/29/2020   Dementia without behavioral disturbance (HCC) 04/15/2020   Abdominal discomfort 04/15/2020   Frequent urination 04/15/2020   Constipation 10/15/2019   Bradycardia 07/18/2018   Onychomycosis 08/30/2016   Insomnia 08/03/2015   Generalized anxiety disorder 08/03/2015   Screening for prostate cancer 08/03/2015   Need for influenza vaccination 08/03/2015   Vaccine counseling 08/03/2015   Second hand tobacco smoke exposure 07/31/2014   Dyslipidemia 07/31/2014   PCP:  System, Provider Not In Pharmacy:   [facility] [place], [place] - [address] [place] [address] Phone: [phone] Fax: [phone]  [facility] [address] [place] [address] Phone: [phone] Fax: [phone]     Social Determinants of Health (SDOH) Social History: SDOH Screenings   Depression (PHQ2-9): Low Risk  (06/02/2021)  Tobacco Use: Medium Risk (01/03/2023)   SDOH Interventions:     Readmission Risk Interventions     No data to display

## 2023-02-08 NOTE — Progress Notes (Signed)
Occupational Therapy Treatment Patient Details Name: Paul Klein MRN: [id number] DOB: June 15, 1956 Today's Date: 02/08/2023   History of present illness 67 y.o. male presenting from [facility] to [facility] ED 01/25/2023 with AMS and elevated temperature. Admitted with UTI; has been unable to void without catheterization. Recent admission for falls and resulting L hip hemiarthroplasty as well as L great toe fx. PMH significant for Alzheimer's, and anxiety.   OT comments  Pt progressing slowly toward Ot goals. Pt performing grooming both at bed level and in modified long sit with up to mod A and max cues for both sequencing and termination of task. Pt reluctant to come all the way top edge of bed reporting pain with BEL movement toward edge. BUE exercises at bed level. Greater ability to follow commands today, but continues to require significant assist.     Recommendations for follow up therapy are one component of a multi-disciplinary discharge planning process, led by the attending physician.  Recommendations may be updated based on patient status, additional functional criteria and insurance authorization.    Assistance Recommended at Discharge Frequent or constant Supervision/Assistance  Patient can return home with the following  Two people to help with walking and/or transfers;Two people to help with bathing/dressing/bathroom;Assistance with cooking/housework;Help with stairs or ramp for entrance;Assist for transportation;Direct supervision/assist for financial management;Direct supervision/assist for medications management;Assistance with feeding   Equipment Recommendations  Other (comment) (defer)    Recommendations for Other Services      Precautions / Restrictions Precautions Precautions: Fall Precaution Comments: Unclear in chart whether pt still has posterior hip precautions so maintained during session Restrictions Weight Bearing Restrictions: No LLE Weight Bearing: Weight  bearing as tolerated       Mobility Bed Mobility Overal bed mobility: Needs Assistance Bed Mobility: Supine to Sit, Sit to Supine     Supine to sit: Max assist Sit to supine: Min guard   General bed mobility comments: pt needing increased time and assist to all extremities initiallybring LEs to and off EOB, impulsively returned to supine without any assist; thus performing ADL retraining at bed level    Transfers                   General transfer comment: pt deferred     Balance Overall balance assessment: Needs assistance Sitting-balance support: Bilateral upper extremity supported, No upper extremity supported, Single extremity supported, Feet supported Sitting balance-Leahy Scale: Fair Sitting balance - Comments: came to semi long sit with feet off EOB to spit into cup Postural control: Posterior lean                                 ADL either performed or assessed with clinical judgement   ADL Overall ADL's : Needs assistance/impaired Eating/Feeding: Maximal assistance;Sitting Eating/Feeding Details (indicate cue type and reason): receiving water in long sit with max A. Pt receiving water from open cup with therapist facilitating. Seems natural for someone else to bring cup to pt Grooming: Oral care;Wash/dry face;Moderate assistance Grooming Details (indicate cue type and reason): mod A for applying toothpaste and sequencing of task. Max cues to rinse mouth and spit out toothpaste after having performed oral care. Followed commands to wash face well today.                               General ADL Comments: Focus session  on ADL retraining and following commands.    Extremity/Trunk Assessment Upper Extremity Assessment Upper Extremity Assessment: Overall WFL for tasks assessed   Lower Extremity Assessment Lower Extremity Assessment: Defer to PT evaluation        Vision   Additional Comments: continues to benefit from therapist  facilitating engagement by getting within pt line of sight as well as placing objects to manipulated within pt line of sight   Perception     Praxis      Cognition Arousal/Alertness: Awake/alert Behavior During Therapy: Flat affect Overall Cognitive Status: History of cognitive impairments - at baseline                                 General Comments: Intermittently able to follow very basic commands, requires significant cueing. inconsistent with ye/no. became agitated with PTA assist for mobility        Exercises Exercises: General Upper Extremity General Exercises - Upper Extremity Shoulder Flexion: AROM, Right, Left, 5 reps, Seated Shoulder Extension: AROM, Right, Left, 5 reps, Seated    Shoulder Instructions       General Comments VSS on RA    Pertinent Vitals/ Pain       Pain Assessment Pain Assessment: Faces Faces Pain Scale: Hurts little more Pain Location: L hip Pain Descriptors / Indicators: Grimacing Pain Intervention(s): Limited activity within patient's tolerance, Monitored during session  Home Living                                          Prior Functioning/Environment              Frequency  Min 1X/week        Progress Toward Goals  OT Goals(current goals can now be found in the care plan section)  Progress towards OT goals: Progressing toward goals (slowly)  Acute Rehab OT Goals Patient Stated Goal: none styated OT Goal Formulation: Patient unable to participate in goal setting Time For Goal Achievement: 02/17/23 Potential to Achieve Goals: Fair ADL Goals Pt Will Perform Grooming: with mod assist;sitting Pt Will Transfer to Toilet: with mod assist;stand pivot transfer  Plan Discharge plan remains appropriate;Frequency remains appropriate    Co-evaluation                 AM-PAC OT "6 Clicks" Daily Activity     Outcome Measure   Help from another person eating meals?: Total Help from  another person taking care of personal grooming?: A Lot Help from another person toileting, which includes using toliet, bedpan, or urinal?: Total Help from another person bathing (including washing, rinsing, drying)?: Total Help from another person to put on and taking off regular upper body clothing?: Total Help from another person to put on and taking off regular lower body clothing?: Total 6 Click Score: 7    End of Session    OT Visit Diagnosis: Other abnormalities of gait and mobility (R26.89);Muscle weakness (generalized) (M62.81);Pain;History of falling (Z91.81);Other symptoms and signs involving cognitive function   Activity Tolerance Other (comment) (limited by cognition)   Patient Left in bed;with call bell/phone within reach;with bed alarm set   Nurse Communication Mobility status        Time: 1610-9604 OT Time Calculation (min): 26 min  Charges: OT General Charges $OT Visit: 1 Visit OT Treatments $Self Care/Home Management :  23-37 mins  Myrla Halsted, OTD, OTR/L [facility] Acute Rehabilitation Office: [phone]   Myrla Halsted 02/08/2023, 5:39 PM

## 2023-02-08 NOTE — Progress Notes (Signed)
ACC continues to follow for discharge disposition to follow at LTC for hospice services.   Doreatha Martin, RN, BSN [facility] Liaison [phone]

## 2023-02-09 DIAGNOSIS — B9620 Unspecified Escherichia coli [E. coli] as the cause of diseases classified elsewhere: Secondary | ICD-10-CM | POA: Diagnosis not present

## 2023-02-09 DIAGNOSIS — R7881 Bacteremia: Secondary | ICD-10-CM | POA: Diagnosis not present

## 2023-02-09 LAB — GLUCOSE, CAPILLARY: Glucose-Capillary: 100 mg/dL — ABNORMAL HIGH (ref 70–99)

## 2023-02-09 NOTE — TOC Progression Note (Signed)
Transition of Care [facility]) - Initial/Assessment Note    Patient Details  Name: Paul Klein MRN: [id number] Date of Birth: 02-Jul-1956  Transition of Care [facility]) CM/SW Contact:    Ralene Bathe, LCSWA Phone Number: 02/09/2023, 3:42 PM  Clinical Narrative:                 LCSW received a call from the patient's spouse stating that the family would like to take the patient home with hospice.  The spouse inquired about being trained on how to manage the foley and  medical equipment needed for the patient to return home.  The care team was notified and the hospice liaison with [facility] will meet with the spouse tomorrow.    TOC following.    Expected Discharge Plan: Hospice Medical Facility Barriers to Discharge: Barriers Resolved   Patient Goals and CMS Choice Patient states their goals for this hospitalization and ongoing recovery are:: To go to [facility]. CMS Medicare.gov Compare Post Acute Care list provided to:: Patient Choice offered to / list presented to : Patient, Spouse, Adult Children (Daughter, [name])      Expected Discharge Plan and Services In-house Referral: Clinical Social Work   Post Acute Care Choice: Residential Hospice Bed Living arrangements for the past 2 months: Assisted Living Facility Expected Discharge Date: 01/31/23                           [facility] Agency: [facility] Date [facility] Agency Contacted: 02/01/23 Time HH Agency Contacted: 1145 Representative spoke with at [facility] Agency: Drenda Freeze  Prior Living Arrangements/Services Living arrangements for the past 2 months: Assisted Living Facility Lives with:: Facility Resident Patient language and need for interpreter reviewed:: No Do you feel safe going back to the place where you live?: Yes      Need for Family Participation in Patient Care: Yes (Comment) Care giver support system in place?: Yes (comment)   Criminal Activity/Legal  Involvement Pertinent to Current Situation/Hospitalization: No - Comment as needed  Activities of Daily Living      Permission Sought/Granted   Permission granted to share information with : Yes, Verbal Permission Granted (from spouse as patient has alzheimer's disease)     Permission granted to share info w AGENCY: [name]        Emotional Assessment Appearance:: Appears stated age Attitude/Demeanor/Rapport: Unable to Assess Affect (typically observed): Unable to Assess Orientation: :  (disoriented) Alcohol / Substance Use: Not Applicable Psych Involvement: No (comment)  Admission diagnosis:  Acute cystitis with hematuria [N30.01] Sepsis (HCC) [A41.9] Altered mental status, unspecified altered mental status type [R41.82] Bacteremia due to Gram-negative bacteria [R78.81] Patient Active Problem List   Diagnosis Date Noted   Altered mental status 01/31/2023   Bacteremia due to Gram-negative bacteria 01/26/2023   Urinary retention 01/25/2023   Normocytic anemia 01/25/2023   Nondisplaced fracture of distal phalanx of right great toe, initial encounter for closed fracture 01/25/2023   Dysphagia 01/01/2023   Femur fracture, left (HCC) 12/31/2022   Closed fracture of left hip (HCC) 12/31/2022   S/p left hip fracture 12/30/2022   Alzheimer's dementia without behavioral disturbance, psychotic disturbance, mood disturbance, or anxiety (HCC) 12/26/2022   Encephalopathy 12/25/2022   Weakness 12/25/2022   Bacteremia due to Escherichia coli 12/25/2022   Dementia with behavioral disturbance (HCC) 07/09/2021   External hemorrhoid 06/02/2021   Hallucination 06/02/2021   Anxiety 06/02/2021  Rectal discomfort 06/02/2021   Advance directive discussed with patient 01/25/2021   Need for pneumococcal vaccination 01/25/2021   Hyperlipidemia 11/30/2020   Encounter for health maintenance examination in adult 11/30/2020   Medicare annual wellness visit, subsequent 11/30/2020    Alzheimer disease (HCC) 11/30/2020   Abnormal albumin 07/07/2020   Impaired fasting blood sugar 07/07/2020   Atherosclerosis of coronary artery of native heart without angina pectoris 07/07/2020   Aortic atherosclerosis (HCC) 07/07/2020   Essential hypertension, benign 04/29/2020   Urinary frequency 04/29/2020   Overactive bladder 04/29/2020   Benign prostatic hyperplasia with urinary frequency 04/29/2020   Screen for colon cancer 04/29/2020   Dementia without behavioral disturbance (HCC) 04/15/2020   Abdominal discomfort 04/15/2020   Frequent urination 04/15/2020   Constipation 10/15/2019   Bradycardia 07/18/2018   Onychomycosis 08/30/2016   Insomnia 08/03/2015   Generalized anxiety disorder 08/03/2015   Screening for prostate cancer 08/03/2015   Need for influenza vaccination 08/03/2015   Vaccine counseling 08/03/2015   Second hand tobacco smoke exposure 07/31/2014   Dyslipidemia 07/31/2014   PCP:  System, Provider Not In Pharmacy:   [facility] [place] - [address] [place] [address] Phone: [phone] Fax: [phone]  [facility] [address] [place] [address] Phone: [phone] Fax: [phone]     Social Determinants of Health (SDOH) Social History: SDOH Screenings   Depression (PHQ2-9): Low Risk  (06/02/2021)  Tobacco Use: Medium Risk (01/03/2023)   SDOH Interventions:     Readmission Risk Interventions     No data to display

## 2023-02-09 NOTE — Progress Notes (Signed)
Subjective:   Summary: Paul Klein is a 67 y.o. year old male currently admitted on the IMTS HD#11 for ESBL bacteremia 2/2 UTI.  Overnight Events: No beds available. NAEON.  Patient assessed at bedside. He is pleasant, interactive, oriented to self only, at baseline.  No pain or discomfort.  Still working with case management to figure out disposition options.  Objective:  Vital signs in last 24 hours: Vitals:   02/08/23 0940 02/08/23 1634 02/08/23 2040 02/09/23 0546  BP: 109/64 (!) 113/95 (!) 125/57 130/65  Pulse: (!) 58 63 (!) 55 (!) 53  Resp: 17 16 15    Temp: 98 F (36.7 C) 97.8 F (36.6 C) 98.2 F (36.8 C) (!) 97.4 F (36.3 C)  TempSrc:    Oral  SpO2: 91% 100%  96%  Weight:      Height:       Supplemental O2: Room Air SpO2: 96 %   Physical Exam:  Constitutional: Elderly appearing, NAD, appears comfortable Cardiovascular: RRR, no murmurs, rubs or gallops Abdominal: soft, non-tender, non-distended Skin: warm and dry Neuro: Alert and oriented x 1 (self)   Filed Weights   01/24/23 2329 02/06/23 0318 02/08/23 0701  Weight: 72 kg 67.5 kg 62.2 kg     Intake/Output Summary (Last 24 hours) at 02/09/2023 0649 Last data filed at 02/08/2023 2040 Gross per 24 hour  Intake 560 ml  Output 1025 ml  Net -465 ml    Net IO Since Admission: -5,595.84 mL [02/09/23 0649]  Pertinent Labs:    Latest Ref Rng & Units 02/08/2023   11:43 AM 01/27/2023   12:49 AM 01/26/2023    4:14 AM  CBC  WBC 4.0 - 10.5 K/uL 9.2  7.4  9.1   Hemoglobin 13.0 - 17.0 g/dL 16.1  9.2  8.8   Hematocrit 39.0 - 52.0 % 37.7  28.8  27.1   Platelets 150 - 400 K/uL 492  152  159        Latest Ref Rng & Units 02/08/2023   11:43 AM 01/27/2023   12:49 AM 01/26/2023    4:14 AM  CMP  Glucose 70 - 99 mg/dL 096  045  409   BUN 8 - 23 mg/dL 12  11  7    Creatinine 0.61 - 1.24 mg/dL 8.11  9.14  7.82   Sodium 135 - 145 mmol/L 140  141  138   Potassium 3.5 - 5.1 mmol/L 3.7  3.8  2.8    Chloride 98 - 111 mmol/L 103  108  105   CO2 22 - 32 mmol/L 27  26  26    Calcium 8.9 - 10.3 mg/dL 8.9  7.8  7.8    Assessment/Plan:   Principal Problem:   Bacteremia due to Escherichia coli Active Problems:   Constipation   Dementia with behavioral disturbance (HCC)   S/p left hip fracture   Urinary retention   Normocytic anemia   Nondisplaced fracture of distal phalanx of right great toe, initial encounter for closed fracture   Bacteremia due to Gram-negative bacteria   Altered mental status   Patient Summary: Paul Klein is a 67 y.o. with a pertinent PMH of Alzheimer's dementia, recurrent falls and recent left proximal femur fx s/p L hemiarthroplasty in April, who presented with worsening encephalopathy and admitted for ESBL bacteremia from UTI.   ESBL E coli bacteremia 2/2 UTI Bladder stretch injury Chronic urinary retention  Vitals remained stable.  He completed course of antibiotics.  Coud cath placed on 5/9 due to difficulty with in and out caths.  Will likely need to remain in place, can try trial of void again later this week if he is still here - Will retry trial of void today.  Discontinue coud cath and every 4 bladder scans.  Replace if he retains greater than 500 cc.  Can consider in and out if possible for less.   Alzheimer's dementia Physical deconditioning Pleasantly demented.  Unfortunately cannot be discharged back to memory unit while he has a urinary catheter in place.  He is currently not a candidate for inpatient hospice.  PT and OT have recommended SNF, will work with case management and family to consider options. Looking into other memory care units that would accept him with urinary catheter in place. SNF is not a good option for him as he cannot be discharged with hospice (family cannot pay out of pocket). - Delirium precautions - Appreciate TOC, case management options - Dysphagia 1 diet.  Assist with feeds and p.o. intake   First distal non  displaced great toe phalangeal fracture Soft tissue edema Unknown mechanism per [facility] staff. Will continue conservative management during hospital stay and following discharge - Tylenol 1000 mg TID - Oxycodone 5 mg for breakthrough pain - Ice PRN -Wound care  Diet:  Dysphagia 1 IVF: None,None VTE: Enoxaparin Code: DNR, full scope care.  Hospice after discharge  Dispo: Looking for disposition options  [name], MD PGY-1 Internal Medicine Resident **Please contact the on call pager after 5 pm and on weekends at [phone].**

## 2023-02-10 ENCOUNTER — Other Ambulatory Visit (HOSPITAL_COMMUNITY): Payer: Self-pay

## 2023-02-10 DIAGNOSIS — F03918 Unspecified dementia, unspecified severity, with other behavioral disturbance: Secondary | ICD-10-CM | POA: Diagnosis not present

## 2023-02-10 DIAGNOSIS — B9620 Unspecified Escherichia coli [E. coli] as the cause of diseases classified elsewhere: Secondary | ICD-10-CM | POA: Diagnosis not present

## 2023-02-10 DIAGNOSIS — R7881 Bacteremia: Secondary | ICD-10-CM | POA: Diagnosis not present

## 2023-02-10 DIAGNOSIS — R339 Retention of urine, unspecified: Secondary | ICD-10-CM | POA: Diagnosis not present

## 2023-02-10 LAB — GLUCOSE, CAPILLARY: Glucose-Capillary: 118 mg/dL — ABNORMAL HIGH (ref 70–99)

## 2023-02-10 MED ORDER — POLYETHYLENE GLYCOL 3350 17 GM/SCOOP PO POWD
17.0000 g | Freq: Every day | ORAL | 0 refills | Status: DC | PRN
  Filled 2023-02-10: qty 238, 14d supply, fill #0

## 2023-02-10 MED ORDER — SENNA 8.6 MG PO TABS
1.0000 | ORAL_TABLET | Freq: Every day | ORAL | 0 refills | Status: DC | PRN
  Filled 2023-02-10: qty 30, 30d supply, fill #0

## 2023-02-10 NOTE — Progress Notes (Signed)
Subjective:   Summary: Paul Klein is a 67 y.o. year old male currently admitted on the IMTS HD#11 for ESBL bacteremia 2/2 UTI.  Overnight Events: family now considering hospice at home. He required one in and out cath ON for bladder scan showing 329cc with 400cc uop via cath, last bladder scan was 226. Still seems to be retaining somewhat but not as much as before.  Bladder scan almost 400 cc right before time of our assessment.  Repeat In-N-Out cath.  He is alert and oriented x 1 and he seems to be at baseline.  No acute concerns.  Patient's wife and daughter spoke to hospice liaison this morning and have decided to pursue discharge with hospice at home.  They need some time to prepare for his arrival, so planning on discharge tomorrow 5/18.  Objective:  Vital signs in last 24 hours: Vitals:   02/09/23 0812 02/09/23 1512 02/09/23 2011 02/10/23 0410  BP: 130/70 122/71 126/63 130/68  Pulse: 60 66 (!) 57 60  Resp: 16 16 16 18   Temp: (!) 97.4 F (36.3 C) 98 F (36.7 C) 98.2 F (36.8 C) 97.9 F (36.6 C)  TempSrc: Oral Oral Oral Oral  SpO2: 100% 100% 93% 100%  Weight:      Height:       Supplemental O2: Room Air SpO2: 100 %   Physical Exam:  Constitutional: Elderly appearing, NAD, appears comfortable Cardiovascular: RRR, no murmurs, rubs or gallops Abdominal: soft, non-tender, non-distended Skin: warm and dry Neuro: Alert and oriented x 1 (self)   Filed Weights   01/24/23 2329 02/06/23 0318 02/08/23 0701  Weight: 72 kg 67.5 kg 62.2 kg     Intake/Output Summary (Last 24 hours) at 02/10/2023 0630 Last data filed at 02/10/2023 0012 Gross per 24 hour  Intake 560 ml  Output 400 ml  Net 160 ml    Net IO Since Admission: -5,735.84 mL [02/10/23 0630]  Pertinent Labs:    Latest Ref Rng & Units 02/08/2023   11:43 AM 01/27/2023   12:49 AM 01/26/2023    4:14 AM  CBC  WBC 4.0 - 10.5 K/uL 9.2  7.4  9.1   Hemoglobin 13.0 - 17.0 g/dL 16.1  9.2  8.8    Hematocrit 39.0 - 52.0 % 37.7  28.8  27.1   Platelets 150 - 400 K/uL 492  152  159        Latest Ref Rng & Units 02/08/2023   11:43 AM 01/27/2023   12:49 AM 01/26/2023    4:14 AM  CMP  Glucose 70 - 99 mg/dL 096  045  409   BUN 8 - 23 mg/dL 12  11  7    Creatinine 0.61 - 1.24 mg/dL 8.11  9.14  7.82   Sodium 135 - 145 mmol/L 140  141  138   Potassium 3.5 - 5.1 mmol/L 3.7  3.8  2.8   Chloride 98 - 111 mmol/L 103  108  105   CO2 22 - 32 mmol/L 27  26  26    Calcium 8.9 - 10.3 mg/dL 8.9  7.8  7.8    Assessment/Plan:   Principal Problem:   Bacteremia due to Escherichia coli Active Problems:   Constipation   Dementia with behavioral disturbance (HCC)   S/p left hip fracture   Urinary retention   Normocytic anemia   Nondisplaced fracture of distal phalanx of right great toe,  initial encounter for closed fracture   Bacteremia due to Gram-negative bacteria   Altered mental status   Patient Summary: [name] is a 67 y.o. with a pertinent PMH of Alzheimer's dementia, recurrent falls and recent left proximal femur fx s/p L hemiarthroplasty in April, who presented with worsening encephalopathy and admitted for ESBL bacteremia from UTI.   ESBL E coli bacteremia 2/2 UTI Bladder stretch injury Chronic urinary retention Vitals remained stable.  He completed course of antibiotics.  Coud cath placed on 5/9 due to difficulty with in and out caths.  He has not yet failed trial of void, but is still retaining urine to some degree.  Given family's decision to discharge home with hospice, Foley catheter placement would likely be easier for them to manage and more comfortable for the patient.  Will replace Foley today and make sure family is trained on management. - Replace indwelling cath today.   Alzheimer's dementia Physical deconditioning Pleasantly demented.  Family has decided to take him home with hospice.  Will work on the process today and make sure he has all necessary medicines through  TOC.  Likely plan for discharge tomorrow morning once family has all the equipment they need. - Delirium precautions - [employer] DC tomorrow.  Patient has not really needed any PRNs or had symptoms of agitation, pain etc. - Dysphagia 1 diet.  Assist with feeds and p.o. intake   First distal non displaced great toe phalangeal fracture Soft tissue edema Unknown mechanism per [facility] staff. Will continue conservative management during hospital stay and following discharge - Tylenol 1000 mg TID - Oxycodone 5 mg for breakthrough pain - Ice PRN -Wound care  Diet:  Dysphagia 1 IVF: None,None VTE: Enoxaparin Code: DNR, full scope care.    Dispo: DC with home hospice likely tomorrow  [name], MD PGY-1 Internal Medicine Resident **Please contact the on call pager after 5 pm and on weekends at [phone].**

## 2023-02-10 NOTE — TOC Transition Note (Signed)
Discharge medications (2) are being stored in the Transitions of Care (TOC) Pharmacy on the second floor until patient is ready for discharge.    

## 2023-02-10 NOTE — Progress Notes (Signed)
[facility] Liaison Note  Met with patient, daughter [name], and wife [name] at bedside to explain hospice at home services, hospice philosophy and team approach to care.  All questions answered and family decision to take patient home with hospice.  Liaison will order hospital bed and overbed table to be delivered prior to patient discharging.  Per wife, she needs the day to rearrange furniture in the home for the hospital bed to be delivered so she is requesting discharge tomorrow if possible.  This will be pending equipment delivery, liaison team will notify TOC once equipment confirmed delivery.  Patient to be discharged to [address] [place], [place] [address].   Pharmacy of record will be [facility] in [place] (corner of [address]  No other needs at this time. Wife and daughter have [facility] contact information if needed.  Please call with any hospice related questions or concerns.  Doreatha Martin, RN, BSN [facility] Liaison [phone]

## 2023-02-10 NOTE — Progress Notes (Signed)
Physical Therapy Treatment Patient Details Name: Paul Klein MRN: [id number] DOB: 11-11-1955 Today's Date: 02/10/2023   History of Present Illness 67 y.o. male presenting from [facility] to [facility] ED 01/25/2023 with AMS and elevated temperature. Admitted with UTI; has been unable to void without catheterization. Recent admission for falls and resulting L hip hemiarthroplasty as well as L great toe fx. PMH significant for Alzheimer's, and anxiety.    PT Comments    Pt continues to make slow progress towards acute goals due to cognitive deficits and LE pain. Pt able to follow simple commands this session and declining transfer to sitting up EOB. Pt agreeable to bed level exercises and able to perform LE therex with hands on assist for technique and increased ROM. Pt positioned in chair position in bed at end of session. Pt continues to benefit from skilled PT services to progress toward functional mobility goals.    Recommendations for follow up therapy are one component of a multi-disciplinary discharge planning process, led by the attending physician.  Recommendations may be updated based on patient status, additional functional criteria and insurance authorization.  Follow Up Recommendations  Can patient physically be transported by private vehicle: No    Assistance Recommended at Discharge Frequent or constant Supervision/Assistance  Patient can return home with the following Two people to help with walking and/or transfers;Two people to help with bathing/dressing/bathroom;Direct supervision/assist for financial management;Direct supervision/assist for medications management;Assist for transportation;Assistance with cooking/housework;Assistance with feeding;Help with stairs or ramp for entrance   Equipment Recommendations  Other (comment) (defer to post acute, likely lift and WC)    Recommendations for Other Services       Precautions / Restrictions Precautions Precautions:  Fall Precaution Comments: Unclear in chart whether pt still has posterior hip precautions so maintained during session Restrictions Weight Bearing Restrictions: No LLE Weight Bearing: Weight bearing as tolerated     Mobility  Bed Mobility Overal bed mobility: Needs Assistance Bed Mobility: Supine to Sit, Sit to Supine Rolling: Total assist   Supine to sit: Max assist Sit to supine: Min guard   General bed mobility comments: pt needing increased time and assist to initially bring LEs to and off EOB, impulsively returned to supine without any assist    Transfers                   General transfer comment: pt deferred    Ambulation/Gait               General Gait Details: pt refused   Stairs             Wheelchair Mobility    Modified Rankin (Stroke Patients Only)       Balance Overall balance assessment: Needs assistance Sitting-balance support: Bilateral upper extremity supported, No upper extremity supported, Single extremity supported, Feet supported Sitting balance-Leahy Scale: Fair                                      Cognition Arousal/Alertness: Awake/alert Behavior During Therapy: Flat affect Overall Cognitive Status: History of cognitive impairments - at baseline                                 General Comments: Intermittently able to follow very basic commands, requires significant cueing. inconsistent with yes/no. became agitated with PTA assist for mobility  Exercises General Exercises - Lower Extremity Ankle Circles/Pumps: AROM, Right, Left, 10 reps, Supine Long Arc Quad: AROM, Right, Left, 10 reps, Seated Heel Slides: AAROM, Right, Left, 10 reps, Supine Hip ABduction/ADduction: AAROM, Right, Left, 10 reps, Supine Straight Leg Raises: AAROM, Right, Left, 10 reps, Supine    General Comments General comments (skin integrity, edema, etc.): VSS on RA      Pertinent Vitals/Pain Pain  Assessment Pain Assessment: Faces Faces Pain Scale: Hurts little more Pain Location: L hip Pain Descriptors / Indicators: Grimacing Pain Intervention(s): Monitored during session, Limited activity within patient's tolerance    Home Living                          Prior Function            PT Goals (current goals can now be found in the care plan section) Acute Rehab PT Goals Patient Stated Goal: unable to state PT Goal Formulation: Patient unable to participate in goal setting Time For Goal Achievement: 02/18/23 Progress towards PT goals: Not progressing toward goals - comment (cognition)    Frequency    Min 2X/week      PT Plan Current plan remains appropriate    Co-evaluation              AM-PAC PT "6 Clicks" Mobility   Outcome Measure  Help needed turning from your back to your side while in a flat bed without using bedrails?: Total Help needed moving from lying on your back to sitting on the side of a flat bed without using bedrails?: Total Help needed moving to and from a bed to a chair (including a wheelchair)?: Total Help needed standing up from a chair using your arms (e.g., wheelchair or bedside chair)?: Total Help needed to walk in hospital room?: Total Help needed climbing 3-5 steps with a railing? : Total 6 Click Score: 6    End of Session   Activity Tolerance: Patient limited by fatigue;Patient limited by pain Patient left: in bed;with call bell/phone within reach;with bed alarm set;Other (comment) (with bed in chair position) Nurse Communication: Other (comment) PT Visit Diagnosis: Other abnormalities of gait and mobility (R26.89);Unsteadiness on feet (R26.81);Pain;Muscle weakness (generalized) (M62.81) Pain - Right/Left: Left Pain - part of body: Hip     Time: 8119-1478 PT Time Calculation (min) (ACUTE ONLY): 15 min  Charges:  $Therapeutic Activity: 8-22 mins                    Ixchel Duck R. PTA Acute Rehabilitation  Services Office: [phone]   Catalina Antigua 02/10/2023, 3:10 PM

## 2023-02-11 LAB — GLUCOSE, CAPILLARY: Glucose-Capillary: 94 mg/dL (ref 70–99)

## 2023-02-11 NOTE — Progress Notes (Signed)
Transportation has been arranged wit [facility].

## 2023-02-11 NOTE — Progress Notes (Signed)
AVS reviewed over phone with wife [name], she verbalized understanding of all DC teaching and instructions. She stated hospice has delivered home equipment and she is ready to receive patient. He will be going via [facility] and will go with [facility] in place.

## 2023-03-27 DEATH — deceased
